# Patient Record
Sex: Female | Born: 1935
Health system: Southern US, Community
[De-identification: ages and names within clinical notes are randomized; demographics above are authoritative.]

## PROBLEM LIST (undated history)

## (undated) DIAGNOSIS — Z96659 Presence of unspecified artificial knee joint: Secondary | ICD-10-CM

## (undated) DIAGNOSIS — R42 Dizziness and giddiness: Secondary | ICD-10-CM

## (undated) DIAGNOSIS — G43909 Migraine, unspecified, not intractable, without status migrainosus: Secondary | ICD-10-CM

## (undated) DIAGNOSIS — F329 Major depressive disorder, single episode, unspecified: Secondary | ICD-10-CM

## (undated) DIAGNOSIS — M810 Age-related osteoporosis without current pathological fracture: Secondary | ICD-10-CM

## (undated) DIAGNOSIS — E785 Hyperlipidemia, unspecified: Secondary | ICD-10-CM

## (undated) DIAGNOSIS — G939 Disorder of brain, unspecified: Secondary | ICD-10-CM

## (undated) DIAGNOSIS — E7800 Pure hypercholesterolemia, unspecified: Secondary | ICD-10-CM

## (undated) DIAGNOSIS — G243 Spasmodic torticollis: Secondary | ICD-10-CM

## (undated) HISTORY — DX: Age-related osteoporosis without current pathological fracture: M81.0

## (undated) HISTORY — PX: ANKLE SURGERY: SHX546

## (undated) HISTORY — DX: Disorder of brain, unspecified: G93.9

## (undated) HISTORY — DX: Dizziness and giddiness: R42

## (undated) HISTORY — DX: Migraine, unspecified, not intractable, without status migrainosus: G43.909

## (undated) HISTORY — DX: Major depressive disorder, single episode, unspecified: F32.9

## (undated) HISTORY — DX: Presence of unspecified artificial knee joint: Z96.659

## (undated) HISTORY — PX: KNEE SURGERY: SHX244

## (undated) HISTORY — DX: Hyperlipidemia, unspecified: E78.5

## (undated) HISTORY — DX: Spasmodic torticollis: G24.3

## (undated) HISTORY — PX: ABDOMINAL HYSTERECTOMY: SHX81

---

## 2004-09-14 ENCOUNTER — Ambulatory Visit: Payer: Self-pay | Admitting: Internal Medicine

## 2004-09-18 ENCOUNTER — Ambulatory Visit: Payer: Self-pay | Admitting: Internal Medicine

## 2005-10-27 ENCOUNTER — Ambulatory Visit: Payer: Self-pay | Admitting: Internal Medicine

## 2006-09-19 ENCOUNTER — Other Ambulatory Visit: Payer: Self-pay

## 2006-09-19 ENCOUNTER — Ambulatory Visit: Payer: Self-pay | Admitting: General Practice

## 2006-10-03 ENCOUNTER — Inpatient Hospital Stay: Payer: Self-pay | Admitting: General Practice

## 2007-03-31 ENCOUNTER — Ambulatory Visit: Payer: Self-pay | Admitting: Internal Medicine

## 2008-07-02 ENCOUNTER — Ambulatory Visit: Payer: Self-pay | Admitting: Internal Medicine

## 2009-07-03 ENCOUNTER — Ambulatory Visit: Payer: Self-pay | Admitting: Internal Medicine

## 2010-03-24 ENCOUNTER — Ambulatory Visit: Payer: Self-pay | Admitting: Gastroenterology

## 2010-07-06 ENCOUNTER — Ambulatory Visit: Payer: Self-pay | Admitting: Unknown Physician Specialty

## 2011-09-08 ENCOUNTER — Ambulatory Visit: Payer: Self-pay | Admitting: Internal Medicine

## 2011-11-21 ENCOUNTER — Inpatient Hospital Stay: Payer: Self-pay | Admitting: Internal Medicine

## 2011-11-21 LAB — COMPREHENSIVE METABOLIC PANEL
Albumin: 3.5 g/dL (ref 3.4–5.0)
Anion Gap: 9 (ref 7–16)
Chloride: 109 mmol/L — ABNORMAL HIGH (ref 98–107)
Creatinine: 0.8 mg/dL (ref 0.60–1.30)
EGFR (African American): >60
EGFR (Non-African Amer.): >60
Glucose: 99 mg/dL (ref 65–99)
Osmolality: 283 (ref 275–301)
Potassium: 4.8 mmol/L (ref 3.5–5.1)
SGPT (ALT): 26 U/L
Sodium: 141 mmol/L (ref 136–145)
Total Protein: 7.6 g/dL (ref 6.4–8.2)

## 2011-11-21 LAB — CBC
HGB: 11.8 g/dL — ABNORMAL LOW (ref 12.0–16.0)
MCH: 34.6 pg — ABNORMAL HIGH (ref 26.0–34.0)
MCHC: 33.3 g/dL (ref 32.0–36.0)
RBC: 3.4 10*6/uL — ABNORMAL LOW (ref 3.80–5.20)
WBC: 11.7 10*3/uL — ABNORMAL HIGH (ref 3.6–11.0)

## 2011-11-21 LAB — URINALYSIS, COMPLETE
Bacteria: NONE SEEN
Bilirubin,UR: NEGATIVE
Glucose,UR: NEGATIVE mg/dL (ref 0–75)
Leukocyte Esterase: NEGATIVE
Protein: NEGATIVE
Squamous Epithelial: 1
WBC UR: 1 /HPF (ref 0–5)

## 2011-11-22 LAB — BASIC METABOLIC PANEL
Anion Gap: 8 (ref 7–16)
Calcium, Total: 8.5 mg/dL (ref 8.5–10.1)
Co2: 27 mmol/L (ref 21–32)
EGFR (African American): >60
Glucose: 89 mg/dL (ref 65–99)
Potassium: 4.6 mmol/L (ref 3.5–5.1)
Sodium: 143 mmol/L (ref 136–145)

## 2011-11-22 LAB — CBC WITH DIFFERENTIAL/PLATELET
Basophil #: 0 10*3/uL (ref 0.0–0.1)
Basophil %: 0.7 %
Eosinophil #: 0.1 10*3/uL (ref 0.0–0.7)
Eosinophil %: 2 %
HCT: 33.9 % — ABNORMAL LOW (ref 35.0–47.0)
HGB: 11.3 g/dL — ABNORMAL LOW (ref 12.0–16.0)
Lymphocyte #: 1.4 10*3/uL (ref 1.0–3.6)
Lymphocyte %: 21.4 %
MCH: 34.8 pg — ABNORMAL HIGH (ref 26.0–34.0)
MCHC: 33.4 g/dL (ref 32.0–36.0)
MCV: 104 fL — ABNORMAL HIGH (ref 80–100)
Monocyte #: 0.4 x10 3/mm (ref 0.2–0.9)
Monocyte %: 6.3 %
Neutrophil #: 4.4 10*3/uL (ref 1.4–6.5)
Neutrophil %: 69.6 %
Platelet: 220 10*3/uL (ref 150–440)
RBC: 3.26 10*6/uL — ABNORMAL LOW (ref 3.80–5.20)
RDW: 13.1 % (ref 11.5–14.5)
WBC: 6.3 10*3/uL (ref 3.6–11.0)

## 2012-01-12 ENCOUNTER — Ambulatory Visit: Payer: Self-pay | Admitting: Orthopedic Surgery

## 2012-01-24 DIAGNOSIS — G243 Spasmodic torticollis: Secondary | ICD-10-CM

## 2012-01-24 HISTORY — DX: Spasmodic torticollis: G24.3

## 2012-09-25 ENCOUNTER — Ambulatory Visit: Payer: Self-pay | Admitting: Internal Medicine

## 2014-02-25 ENCOUNTER — Emergency Department: Payer: Self-pay | Admitting: Emergency Medicine

## 2014-02-25 LAB — URINALYSIS, COMPLETE
BACTERIA: NONE SEEN
BLOOD: NEGATIVE
Bilirubin,UR: NEGATIVE
Glucose,UR: NEGATIVE mg/dL (ref 0–75)
Ketone: NEGATIVE
Leukocyte Esterase: NEGATIVE
Nitrite: NEGATIVE
PROTEIN: NEGATIVE
Ph: 6 (ref 4.5–8.0)
RBC,UR: 1 /HPF (ref 0–5)
Specific Gravity: 1.012 (ref 1.003–1.030)
Squamous Epithelial: <1
WBC UR: NONE SEEN /HPF (ref 0–5)

## 2014-02-25 LAB — COMPREHENSIVE METABOLIC PANEL
ALBUMIN: 3.5 g/dL (ref 3.4–5.0)
ALK PHOS: 84 U/L
AST: 31 U/L (ref 15–37)
Anion Gap: 4 — ABNORMAL LOW (ref 7–16)
BILIRUBIN TOTAL: 0.3 mg/dL (ref 0.2–1.0)
BUN: 17 mg/dL (ref 7–18)
CHLORIDE: 106 mmol/L (ref 98–107)
CREATININE: 0.89 mg/dL (ref 0.60–1.30)
Calcium, Total: 9.1 mg/dL (ref 8.5–10.1)
Co2: 29 mmol/L (ref 21–32)
EGFR (Non-African Amer.): >60
GLUCOSE: 86 mg/dL (ref 65–99)
Osmolality: 278 (ref 275–301)
Potassium: 3.8 mmol/L (ref 3.5–5.1)
SGPT (ALT): 22 U/L
SODIUM: 139 mmol/L (ref 136–145)
TOTAL PROTEIN: 7.6 g/dL (ref 6.4–8.2)

## 2014-02-25 LAB — CBC
HCT: 36.9 % (ref 35.0–47.0)
HGB: 12.1 g/dL (ref 12.0–16.0)
MCH: 33.9 pg (ref 26.0–34.0)
MCHC: 32.7 g/dL (ref 32.0–36.0)
MCV: 104 fL — ABNORMAL HIGH (ref 80–100)
PLATELETS: 344 10*3/uL (ref 150–440)
RBC: 3.56 10*6/uL — ABNORMAL LOW (ref 3.80–5.20)
RDW: 13.3 % (ref 11.5–14.5)
WBC: 6.2 10*3/uL (ref 3.6–11.0)

## 2014-02-25 LAB — TROPONIN I: Troponin-I: <0.02 ng/mL

## 2014-03-11 ENCOUNTER — Encounter: Payer: Self-pay | Admitting: Neurology

## 2014-03-13 ENCOUNTER — Ambulatory Visit: Payer: Self-pay | Admitting: Neurology

## 2014-03-21 ENCOUNTER — Encounter: Payer: Self-pay | Admitting: Neurology

## 2014-03-27 DIAGNOSIS — G939 Disorder of brain, unspecified: Secondary | ICD-10-CM

## 2014-03-27 HISTORY — DX: Disorder of brain, unspecified: G93.9

## 2014-04-21 ENCOUNTER — Encounter: Payer: Self-pay | Admitting: Neurology

## 2014-05-21 ENCOUNTER — Encounter: Payer: Self-pay | Admitting: Neurology

## 2014-06-21 ENCOUNTER — Encounter: Payer: Self-pay | Admitting: Neurology

## 2014-10-13 NOTE — Discharge Summary (Signed)
PATIENT NAME:  [name], Gabriella Rogers DATE OF BIRTH:  Jun 05, 1934  DATE OF ADMISSION:  11/21/2011 DATE OF DISCHARGE:  11/23/2011  DISCHARGE DIAGNOSES:  1. Near syncope. 2. Right inferior pubic rami fracture.  3. Osteoporosis. 4. Osteoarthritis.  5. Normocytic anemia. 6. Mild leukocytosis. 7. Migraine headaches.  8. Gastroesophageal reflux disease.  9. History of cervical dystonia status post Botox injection.   DISPOSITION: Patient is being discharged home with home health and physical therapy.   DIET: Regular.   ACTIVITY: As tolerated.   FOLLOW UP: Follow up with Dr. Martha ClanKrasinski and Dr. Randa LynnLamb in 1 to 2 weeks after discharge.   DISCHARGE MEDICATIONS: 1. Norco 5/325, 1 to 2 tablets every four hours p.r.n. pain.  2. Dulcolax 1 tablet as needed for constipation. 3. Caltrate 600 mg with vitamin D 1 tablet b.i.d.  4. Klonopin 0.5 mg 1 tablet in the morning and 1/2 tablet, 0.25 mg, at bedtime.  5. Paxil 10 mg daily. Daily.  6. Evista 1 mg daily.  7. Aspirin 81 mg daily.   CONSULTATIONS:  1. Physical therapy consultation.  2. Orthopedic consultation with Dr. Martha ClanKrasinski.   LABORATORY, DIAGNOSTIC AND RADIOLOGICAL DATA: Pelvic x-ray: Right inferior pubic ramus fracture. Carotid ultrasound showed no acute abnormalities. Echo showed no acute abnormalities, normal ejection fraction more than 55%, RVSP 30 to 40 mmHg. White count 11.7 on admission, 6.3 by the time of discharge. Hemoglobin 11.8. Normal platelet count. CBC, CMP essentially normal.   HOSPITAL COURSE: Patient is a 81 year old female with past medical history of osteoporosis, osteoarthritis, migraine headaches, gastroesophageal reflux disease who presented with fall. There was a question of possible syncope/near syncope, however, patient denied loss of consciousness. She reported that her foot had got caught and she had tripped. She was subsequently unable to get up and was having difficulty walking and weight bearing therefore  she was brought by her family to the Emergency Room where an x-ray revealed right inferior pubic rami fracture. She also was worked up for near syncope with an echocardiogram and carotid ultrasound, both of which were normal. An orthopedic consultation with Dr. Martha ClanKrasinski was obtained who recommended conservative management. Patient was evaluated by PT. She ambulated well with a walker and had a walker at home. Home health and physical therapy were offered to the patient and she accepted. She is being discharged home in a stable condition.  TIME SPENT: 45 minutes.   ____________________________ Darrick MeigsSangeeta Tamar Miano, MD sp:cms D: 11/23/2011 14:51:25 ET T: 11/24/2011 10:30:41 ET JOB#: [id number]  cc: Darrick MeigsSangeeta Saraiya Kozma, MD, <Dictator> Reola MosherAndrew S. Randa LynnLamb, MD Darrick MeigsSANGEETA Taeveon Keesling MD ELECTRONICALLY SIGNED 11/24/2011 14:58

## 2014-10-13 NOTE — Consult Note (Signed)
Brief Consult Note: Diagnosis: Right inferior rami fracture.   Patient was seen by consultant.   Recommend further assessment or treatment.   Comments: Patient is 81 y/o female who fell unloading bags of from her car.  She lost her balance going into the house and landed on her right buttocks.  She had significant pain and was brought to [facility] ER where x-rays were suggestive of an inferior rami fracture.  On exam, she has ecchymosis overlying the ischial tuberosity.  She has no pain with internal or external hip rotation with log-rolling.  She has intact sensation to light touch, intact motor function and palpable pedal pulses in the right lower extremity.  She has no pain on the left.  Radiograph of the pelvis was reviewed from the ED which shows a lucency through the inferior rami, near the ischial tuberosity.  No obvious fracture is seen in the superior rami.  I would recommend PT evaluation in the AM and the patient may WBAT on the right lower extremity with a walker.  She may follow-up in my office in 4 weeks after discharge for re-evaluation.  Will sign off for now.  Electronic Signatures: Juanell FairlyKrasinski, Brentyn Seehafer (MD)  (Signed 03-Jun-13 19:32)  Authored: Brief Consult Note   Last Updated: 03-Jun-13 19:32 by Juanell FairlyKrasinski, Asley Baskerville (MD)

## 2014-10-13 NOTE — H&P (Signed)
PATIENT NAME:  Gabriella Rogers, Gabriella Rogers MR#:  [id number] DATE OF BIRTH:  1934/05/29  DATE OF ADMISSION:  11/21/2011  REFERRING PHYSICIAN: Janalyn Harderavid Kaminski, MD  FAMILY PHYSICIAN: Alonna BucklerAndrew Lamb, MD  REASON FOR ADMISSION: Near syncope with fall resulting in right inferior ramus fracture.   HISTORY OF PRESENT ILLNESS: The patient is a 81 year old female with a history of osteoporosis and osteoarthritis who was walking outside today where she apparently stumbled and fell falling on her back. She does not feel that she passed out all the way, although she did feel lightheaded and dizzy. She was brought to the Emergency Room where she was found to have a right inferior ramus fracture. Cardiac murmur was noted on exam. She denies any history of cardiac disease. She is now admitted for further evaluation.   PAST MEDICAL HISTORY:  1. Osteoporosis.  2. Osteoarthritis.  3. Migraine headaches. 4. Environmental allergies. 5. Gastroesophageal reflux disease.  6. History of cervical dystonia requiring Botox injections.  7. Status post hysterectomy.  8. Status post left knee surgery.  9. Status post left ankle fusion.   MEDICATIONS:  1. Evista 60 mg p.o. daily.  2. Paxil 10 mg p.o. daily.  3. Clonazepam 0.5 mg p.o. every a.m. and 0.25 mg p.o. every p.m.  4. Caltrate D one p.o. twice a day. 5. Aspirin 81 mg p.o. daily.   ALLERGIES: Aleve and Actonel.   SOCIAL HISTORY: The patient is married. No history of alcohol or tobacco abuse.   FAMILY HISTORY: Positive for coronary artery disease and hypertension.   REVIEW OF SYSTEMS: CONSTITUTIONAL: No fever or change in weight. EYES: No blurred or double vision. No glaucoma. ENT: No tinnitus or hearing loss. No nasal discharge or bleeding. No difficulty swallowing. RESPIRATORY: No cough or wheezing. Denies hemoptysis or painful respirations. CARDIOVASCULAR: No chest pain or orthopnea. Denies palpitations. Denies actual syncope. GI: No nausea, vomiting, or diarrhea. No  abdominal pain. No change in bowel habits. GU: No dysuria or hematuria. No incontinence. ENDOCRINE: No polyuria or polydipsia. No heat or cold intolerance. HEMATOLOGIC: The patient denies anemia, easy bruising, or bleeding. LYMPHATIC: No swollen glands. MUSCULOSKELETAL: The patient does have some pain in her back and hips. Denies neck or shoulder pain. No knee pain. Has chronic left ankle pain. No gout. NEUROLOGIC: No numbness or weakness. Denies stroke or seizures. No recent migraines. PSYCH: The patient denies anxiety, insomnia, or depression.   PHYSICAL EXAMINATION:   GENERAL: The patient is in no acute distress.   VITAL SIGNS: Vital signs are remarkable for a blood pressure of 155/69 with a heart rate of 60 and a respiratory of 20. She is afebrile.   HEENT: Normocephalic, atraumatic. Pupils are equally round and reactive to light and accommodation. Extraocular movements are intact. Sclerae anicteric. Conjunctivae are clear. Oropharynx is clear.   NECK: Supple without jugular venous distention or bruits. No adenopathy or thyromegaly is noted.   LUNGS: Clear to auscultation and percussion without wheezes, rales, or rhonchi. No dullness.   CARDIAC: Regular rate and rhythm with normal S1 and S2. There is a 2/6 systolic murmur noted throughout the precordium. No rubs or gallops were present.   ABDOMEN: Soft and nontender with normoactive bowel sounds. No organomegaly or masses were appreciated. No hernias or bruits were noted.   EXTREMITIES: No clubbing, cyanosis, or edema. Pulses were 2+ bilaterally.   SKIN: Warm and dry without rash or lesions.   NEUROLOGIC: Cranial nerves II through XII grossly intact. Deep tendon reflexes were symmetric. Motor and  sensory exam is nonfocal.   PSYCH: Examination revealed a patient who was alert and oriented to person, place, and time. She was cooperative and used good judgment.   LABS/STUDIES: Urinalysis was negative.   White count was 11.7 with a  hemoglobin of 11.8. Glucose is 99 with a BUN of 17 and a creatinine of 0.8 with a sodium of 141 and a potassium of 4.8.   EKG revealed sinus rhythm with no acute ischemic changes.   Plain films revealed a nondisplaced fracture of the right inferior ramus.   ASSESSMENT:  1. Near syncope.  2. Cardiac murmur.  3. Anemia of chronic disease.  4. Right inferior ramus fracture.  5. Osteoporosis.  6. Osteoarthritis.   PLAN: The patient will be admitted to orthopedics with off unit telemetry. We will obtain an echocardiogram because of her murmur and we will obtain carotid Doppler's because of her near syncopal episode. In regards to her inferior ramus fracture, we will consult orthopedics and begin physical therapy. We will use narcotics as needed for pain management. We will obtain a case          manager consult for possible rehab placement. Follow-up routine labs in the morning. Further treatment and evaluation will depend upon the patient's progress.   TOTAL TIME SPENT:  50 minutes.  ____________________________ Duane Lope Judithann Sheen, MD jds:slb D: 11/21/2011 19:26:21 ET T: 11/22/2011 07:39:49 ET JOB#: [id number]  cc: Duane Lope. Judithann Sheen, MD, <Dictator> Reola Mosher. Randa Lynn, MD Toby Breithaupt Rodena Medin MD ELECTRONICALLY SIGNED 11/22/2011 14:56

## 2015-03-05 DIAGNOSIS — M12569 Traumatic arthropathy, unspecified knee: Secondary | ICD-10-CM | POA: Insufficient documentation

## 2015-06-22 ENCOUNTER — Emergency Department
Admission: EM | Admit: 2015-06-22 | Discharge: 2015-06-22 | Disposition: A | Discharge status: Home / Self Care | Payer: Medicare HMO | Attending: Emergency Medicine | Admitting: Emergency Medicine

## 2015-06-22 ENCOUNTER — Emergency Department: Payer: Medicare HMO

## 2015-06-22 ENCOUNTER — Encounter: Payer: Self-pay | Admitting: Emergency Medicine

## 2015-06-22 DIAGNOSIS — S52592A Other fractures of lower end of left radius, initial encounter for closed fracture: Secondary | ICD-10-CM | POA: Insufficient documentation

## 2015-06-22 DIAGNOSIS — S52502A Unspecified fracture of the lower end of left radius, initial encounter for closed fracture: Secondary | ICD-10-CM | POA: Diagnosis not present

## 2015-06-22 DIAGNOSIS — Y9289 Other specified places as the place of occurrence of the external cause: Secondary | ICD-10-CM | POA: Insufficient documentation

## 2015-06-22 DIAGNOSIS — W1839XA Other fall on same level, initial encounter: Secondary | ICD-10-CM | POA: Insufficient documentation

## 2015-06-22 DIAGNOSIS — S5012XA Contusion of left forearm, initial encounter: Secondary | ICD-10-CM | POA: Diagnosis not present

## 2015-06-22 DIAGNOSIS — Y9389 Activity, other specified: Secondary | ICD-10-CM | POA: Insufficient documentation

## 2015-06-22 DIAGNOSIS — Y998 Other external cause status: Secondary | ICD-10-CM | POA: Insufficient documentation

## 2015-06-22 DIAGNOSIS — S6992XA Unspecified injury of left wrist, hand and finger(s), initial encounter: Secondary | ICD-10-CM | POA: Diagnosis present

## 2015-06-22 DIAGNOSIS — Z79899 Other long term (current) drug therapy: Secondary | ICD-10-CM | POA: Diagnosis not present

## 2015-06-22 HISTORY — DX: Pure hypercholesterolemia, unspecified: E78.00

## 2015-06-22 MED ORDER — TRAMADOL HCL 50 MG PO TABS: 50.0000 mg | ORAL_TABLET | Freq: Four times a day (QID) | ORAL | Status: DC | PRN

## 2015-06-22 MED ORDER — TRAMADOL HCL 50 MG PO TABS
50.0000 mg | ORAL_TABLET | Freq: Once | ORAL | Status: AC
Start: 2015-06-22 — End: 2015-06-22
  Administered 2015-06-22: 50 mg via ORAL
  Filled 2015-06-22: qty 1

## 2015-06-22 NOTE — ED Notes (Signed)
States she fell on Friday  Having pain to left wrist area

## 2015-06-22 NOTE — ED Provider Notes (Signed)
[facility] Emergency Department Provider Note ____________________________________________  Time seen: Approximately 12:09 PM  I have reviewed the triage vital signs and the nursing notes.   HISTORY  Chief Complaint Fall    HPI Gabriella Rogers is a 82 y.o. female who presents to the emergency department for evaluation of left wrist pain. She had a mechanical, non-syncopal fall on Friday while trying to put a fitted sheet on her bed. She states that she pulled a sheet and lost her balance which caused her to fall backward. She denies loss of consciousness. The incident occurred on Friday. She has been using an Ace wrap for compression and taking ibuprofen with minimal relief of pain.   Past Medical History  Diagnosis Date  . Hypercholesteremia     There are no active problems to display for this patient.   Past Surgical History  Procedure Laterality Date  . Abdominal hysterectomy      Current Outpatient Rx  Name  Route  Sig  Dispense  Refill  . simvastatin (ZOCOR) 20 MG tablet   Oral   Take 20 mg by mouth daily.         . traMADol (ULTRAM) 50 MG tablet   Oral   Take 1 tablet (50 mg total) by mouth every 6 (six) hours as needed.   20 tablet   0     Allergies Aleve and Fosamax  No family history on file.  Social History Social History  Substance Use Topics  . Smoking status: Never Smoker   . Smokeless tobacco: None  . Alcohol Use: No    Review of Systems Constitutional: No recent illness. Eyes: No visual changes. ENT: No sore throat. Cardiovascular: Denies chest pain or palpitations. Respiratory: Denies shortness of breath. Gastrointestinal: No abdominal pain.  Genitourinary: Negative for dysuria. Musculoskeletal: Pain in left hand and wrist Skin: Negative for rash. Neurological: Negative for headaches, focal weakness or numbness. 10-point ROS otherwise negative.  ____________________________________________   PHYSICAL  EXAM:  VITAL SIGNS: ED Triage Vitals  Enc Vitals Group     BP 06/22/15 1142 147/78 mmHg     Pulse Rate 06/22/15 1142 68     Resp 06/22/15 1142 18     Temp 06/22/15 1142 97.7 F (36.5 C)     Temp Source 06/22/15 1142 Oral     SpO2 06/22/15 1142 98 %     Weight 06/22/15 1142 116 lb (52.617 kg)     Height 06/22/15 1142 5\' 3"  (1.6 m)     Head Cir --      Peak Flow --      Pain Score 06/22/15 1130 8     Pain Loc --      Pain Edu? --      Excl. in GC? --     Constitutional: Alert and oriented. Well appearing and in no acute distress. Eyes: Conjunctivae are normal. EOMI. Head: Atraumatic. Nose: No congestion/rhinnorhea. Neck: No stridor.  Respiratory: Normal respiratory effort.   Musculoskeletal: Tenderness and mild deformity noted to the radial aspect of the left wrist. There is mild snuffbox tenderness noted in the left hand. She has full range of motion and 2 point discrimination in the hand and fingers. Neurologic:  Normal speech and language. No gross focal neurologic deficits are appreciated. Speech is normal. No gait instability. Skin:  Skin is warm, dry and intact. Ecchymosis noted from the mid forearm to the hand with swelling. Psychiatric: Mood and affect are normal. Speech and behavior are  normal.  ____________________________________________   LABS (all labs ordered are listed, but only abnormal results are displayed)  Labs Reviewed - No data to display ____________________________________________  RADIOLOGY  Distal radius fracture left.  I, Kem Boroughs, personally viewed and evaluated these images (plain radiographs) as part of my medical decision making, as well as reviewing the written report by the radiologist.  ____________________________________________   PROCEDURES  Procedure(s) performed:   Reverse sugar tong OCL was placed by ER tech. Patient was neurovascularly intact post-splint  application. ____________________________________________   INITIAL IMPRESSION / ASSESSMENT AND PLAN / ED COURSE  Pertinent labs & imaging results that were available during my care of the patient were reviewed by me and considered in my medical decision making (see chart for details).  Initial fracture care provided in the emergency department with follow-up greater than 24 hours.  Patient was advised to call and schedule an appointment with orthopedics. She was advised to return to the emergency department for any symptom that changes or worsen if she is unable to schedule an appointment. ____________________________________________   FINAL CLINICAL IMPRESSION(S) / ED DIAGNOSES  Final diagnoses:  Distal radius fracture, left, closed, initial encounter       Chinita Pester, FNP 06/22/15 1230  Jene Every, MD 06/22/15 1530

## 2015-06-22 NOTE — Discharge Instructions (Signed)
Forearm Fracture °A forearm fracture is a break in one or both of the bones of your arm that are between the elbow and the wrist. Your forearm is made up of two bones: °· Radius. This is the bone on the inside of your arm near your thumb. °· Ulna. This is the bone on the outside of your arm near your little finger. °Middle forearm fractures usually break both the radius and the ulna. Most forearm fractures that involve both the ulna and radius will require surgery. °CAUSES °Common causes of this type of fracture include: °· Falling on an outstretched arm. °· Accidents, such as a car or bike accident. °· A hard, direct hit to the middle part of your arm. °RISK FACTORS °You may be at higher risk for this type of fracture if: °· You play contact sports. °· You have a condition that causes your bones to be weak or thin (osteoporosis). °SIGNS AND SYMPTOMS °A forearm fracture causes pain immediately after the injury. Other signs and symptoms include: °· An abnormal bend or bump in your arm (deformity). °· Swelling. °· Numbness or tingling. °· Tenderness. °· Inability to turn your hand from side to side (rotate). °· Bruising. °DIAGNOSIS °Your health care provider may diagnose a forearm fracture based on: °· Your symptoms. °· Your medical history, including any recent injury. °· A physical exam. Your health care provider will look for any deformity and feel for tenderness over the break. Your health care provider will also check whether the bones are out of place. °· An X-ray exam to confirm the diagnosis and learn more about the type of fracture. °TREATMENT °The goals of treatment are to get the bone or bones in proper position for healing and to keep the bones from moving so they will heal over time. Your treatment will depend on many factors, especially the type of fracture that you have. °· If the fractured bone or bones: °¨ Are in the correct position (nondisplaced), you may only need to wear a cast or a  splint. °¨ Have a slightly displaced fracture, you may need to have the bones moved back into place manually (closed reduction) before the splint or cast is put on. °· You may have a temporary splint before you have a cast. The splint allows room for some swelling. After a few days, a cast can replace the splint. °· You may have to wear the cast for 6-8 weeks or as directed by your health care provider. °· The cast may be changed after about 3 weeks or as directed by your health care provider. °· After your cast is removed, you may need physical therapy to regain full movement in your wrist or elbow. °· You may need emergency surgery if you have: °¨ A fractured bone or bones that are out of position (displaced). °¨ A fracture with multiple fragments (comminuted fracture). °¨ A fracture that breaks the skin (open fracture). This type of fracture may require surgical wires, plates, or screws to hold the bone or bones in place. °· You may have X-rays every couple of weeks to check on your healing. °HOME CARE INSTRUCTIONS °If You Have a Cast: °· Do not stick anything inside the cast to scratch your skin. Doing that increases your risk of infection. °· Check the skin around the cast every day. Report any concerns to your health care provider. You may put lotion on dry skin around the edges of the cast. Do not apply lotion to the skin   underneath the cast. °If You Have a Splint: °· Wear it as directed by your health care provider. Remove it only as directed by your health care provider. °· Loosen the splint if your fingers become numb and tingle, or if they turn cold and blue. °Bathing °· Cover the cast or splint with a watertight plastic bag to protect it from water while you bathe or shower. Do not let the cast or splint get wet. °Managing Pain, Stiffness, and Swelling °· If directed, apply ice to the injured area: °¨ Put ice in a plastic bag. °¨ Place a towel between your skin and the bag. °¨ Leave the ice on for 20  minutes, 2-3 times a day. °· Move your fingers often to avoid stiffness and to lessen swelling. °· Raise the injured area above the level of your heart while you are sitting or lying down. °Driving °· Do not drive or operate heavy machinery while taking pain medicine. °· Do not drive while wearing a cast or splint on a hand that you use for driving. °Activity °· Return to your normal activities as directed by your health care provider. Ask your health care provider what activities are safe for you. °· Perform range-of-motion exercises only as directed by your health care provider. °Safety °· Do not use your injured limb to support your body weight until your health care provider says that you can. °General Instructions °· Do not put pressure on any part of the cast or splint until it is fully hardened. This may take several hours. °· Keep the cast or splint clean and dry. °· Do not use any tobacco products, including cigarettes, chewing tobacco, or electronic cigarettes. Tobacco can delay bone healing. If you need help quitting, ask your health care provider. °· Take medicines only as directed by your health care provider. °· Keep all follow-up visits as directed by your health care provider. This is important. °SEEK MEDICAL CARE IF: °· Your pain medicine is not helping. °· Your cast or splint becomes wet or damaged or suddenly feels too tight. °· Your cast becomes loose. °· You have more severe pain or swelling than you did before the cast. °· You have severe pain when you stretch your fingers. °· You continue to have pain or stiffness in your elbow or your wrist after your cast is removed. °SEEK IMMEDIATE MEDICAL CARE IF: °· You cannot move your fingers. °· You lose feeling in your fingers or your hand. °· Your hand or your fingers turn cold and pale or blue. °· You notice a bad smell coming from your cast. °· You have drainage from underneath your cast. °· You have new stains from blood or drainage that is coming  through your cast. °  °This information is not intended to replace advice given to you by your health care provider. Make sure you discuss any questions you have with your health care provider. °  °Document Released: 06/04/2000 Document Revised: 06/28/2014 Document Reviewed: 01/21/2014 °[employer]vier Interactive Patient Education ©2016 [employer] ° °

## 2015-06-22 NOTE — ED Notes (Signed)
States she fell on Friday  Landed on left arm  Having pain to left wrist area states she loses her balance nalot

## 2015-07-02 DIAGNOSIS — M25532 Pain in left wrist: Secondary | ICD-10-CM | POA: Diagnosis not present

## 2015-07-02 DIAGNOSIS — S52502A Unspecified fracture of the lower end of left radius, initial encounter for closed fracture: Secondary | ICD-10-CM | POA: Diagnosis not present

## 2015-07-11 DIAGNOSIS — M25532 Pain in left wrist: Secondary | ICD-10-CM | POA: Diagnosis not present

## 2015-07-11 DIAGNOSIS — S52502D Unspecified fracture of the lower end of left radius, subsequent encounter for closed fracture with routine healing: Secondary | ICD-10-CM | POA: Diagnosis not present

## 2015-07-22 DIAGNOSIS — R5382 Chronic fatigue, unspecified: Secondary | ICD-10-CM | POA: Diagnosis not present

## 2015-07-22 DIAGNOSIS — M797 Fibromyalgia: Secondary | ICD-10-CM | POA: Diagnosis not present

## 2015-07-22 DIAGNOSIS — R5383 Other fatigue: Secondary | ICD-10-CM | POA: Diagnosis not present

## 2015-07-22 DIAGNOSIS — E785 Hyperlipidemia, unspecified: Secondary | ICD-10-CM | POA: Diagnosis not present

## 2015-07-23 DIAGNOSIS — I10 Essential (primary) hypertension: Secondary | ICD-10-CM | POA: Diagnosis not present

## 2015-07-23 DIAGNOSIS — R5381 Other malaise: Secondary | ICD-10-CM | POA: Diagnosis not present

## 2015-07-23 DIAGNOSIS — E784 Other hyperlipidemia: Secondary | ICD-10-CM | POA: Diagnosis not present

## 2015-07-25 DIAGNOSIS — M25532 Pain in left wrist: Secondary | ICD-10-CM | POA: Diagnosis not present

## 2015-07-25 DIAGNOSIS — S52502D Unspecified fracture of the lower end of left radius, subsequent encounter for closed fracture with routine healing: Secondary | ICD-10-CM | POA: Diagnosis not present

## 2015-08-08 DIAGNOSIS — H2513 Age-related nuclear cataract, bilateral: Secondary | ICD-10-CM | POA: Diagnosis not present

## 2015-08-15 DIAGNOSIS — S52502D Unspecified fracture of the lower end of left radius, subsequent encounter for closed fracture with routine healing: Secondary | ICD-10-CM | POA: Diagnosis not present

## 2015-08-15 DIAGNOSIS — M25532 Pain in left wrist: Secondary | ICD-10-CM | POA: Diagnosis not present

## 2015-08-26 DIAGNOSIS — M542 Cervicalgia: Secondary | ICD-10-CM | POA: Diagnosis not present

## 2015-08-26 DIAGNOSIS — G309 Alzheimer's disease, unspecified: Secondary | ICD-10-CM | POA: Diagnosis not present

## 2015-08-26 DIAGNOSIS — Z79899 Other long term (current) drug therapy: Secondary | ICD-10-CM | POA: Diagnosis not present

## 2015-08-26 DIAGNOSIS — G243 Spasmodic torticollis: Secondary | ICD-10-CM | POA: Diagnosis not present

## 2015-08-26 DIAGNOSIS — R69 Illness, unspecified: Secondary | ICD-10-CM | POA: Diagnosis not present

## 2015-08-26 DIAGNOSIS — Z7982 Long term (current) use of aspirin: Secondary | ICD-10-CM | POA: Diagnosis not present

## 2015-09-15 ENCOUNTER — Other Ambulatory Visit: Payer: Self-pay | Admitting: Internal Medicine

## 2015-09-15 DIAGNOSIS — M810 Age-related osteoporosis without current pathological fracture: Secondary | ICD-10-CM

## 2015-09-18 DIAGNOSIS — G243 Spasmodic torticollis: Secondary | ICD-10-CM | POA: Diagnosis not present

## 2015-09-18 DIAGNOSIS — E785 Hyperlipidemia, unspecified: Secondary | ICD-10-CM | POA: Diagnosis not present

## 2015-09-25 ENCOUNTER — Ambulatory Visit
Admission: RE | Admit: 2015-09-25 | Discharge: 2015-09-25 | Disposition: A | Discharge status: Home / Self Care | Payer: Medicare HMO | Source: Ambulatory Visit | Attending: Internal Medicine | Admitting: Internal Medicine

## 2015-09-25 DIAGNOSIS — M8580 Other specified disorders of bone density and structure, unspecified site: Secondary | ICD-10-CM | POA: Diagnosis not present

## 2015-09-25 DIAGNOSIS — M810 Age-related osteoporosis without current pathological fracture: Secondary | ICD-10-CM

## 2015-09-25 DIAGNOSIS — Z13820 Encounter for screening for osteoporosis: Secondary | ICD-10-CM | POA: Diagnosis not present

## 2015-09-25 DIAGNOSIS — Z780 Asymptomatic menopausal state: Secondary | ICD-10-CM | POA: Diagnosis not present

## 2015-10-22 DIAGNOSIS — G5602 Carpal tunnel syndrome, left upper limb: Secondary | ICD-10-CM | POA: Diagnosis not present

## 2015-11-12 DIAGNOSIS — L821 Other seborrheic keratosis: Secondary | ICD-10-CM | POA: Diagnosis not present

## 2015-11-20 DIAGNOSIS — Z96652 Presence of left artificial knee joint: Secondary | ICD-10-CM | POA: Diagnosis not present

## 2015-11-20 DIAGNOSIS — M25561 Pain in right knee: Secondary | ICD-10-CM | POA: Diagnosis not present

## 2015-11-20 DIAGNOSIS — M25562 Pain in left knee: Secondary | ICD-10-CM | POA: Diagnosis not present

## 2015-11-20 DIAGNOSIS — M1731 Unilateral post-traumatic osteoarthritis, right knee: Secondary | ICD-10-CM | POA: Diagnosis not present

## 2015-11-23 DIAGNOSIS — Z96659 Presence of unspecified artificial knee joint: Secondary | ICD-10-CM

## 2015-11-23 HISTORY — DX: Presence of unspecified artificial knee joint: Z96.659

## 2015-11-26 ENCOUNTER — Ambulatory Visit (INDEPENDENT_AMBULATORY_CARE_PROVIDER_SITE_OTHER): Payer: Medicare HMO | Admitting: Urology

## 2015-11-26 ENCOUNTER — Encounter: Payer: Self-pay | Admitting: Urology

## 2015-11-26 VITALS — BP 118/77 | HR 65 | Ht 63.0 in | Wt 118.7 lb

## 2015-11-26 DIAGNOSIS — F32A Depression, unspecified: Secondary | ICD-10-CM

## 2015-11-26 DIAGNOSIS — R3129 Other microscopic hematuria: Secondary | ICD-10-CM | POA: Diagnosis not present

## 2015-11-26 DIAGNOSIS — F329 Major depressive disorder, single episode, unspecified: Secondary | ICD-10-CM | POA: Insufficient documentation

## 2015-11-26 DIAGNOSIS — G43909 Migraine, unspecified, not intractable, without status migrainosus: Secondary | ICD-10-CM

## 2015-11-26 DIAGNOSIS — N3941 Urge incontinence: Secondary | ICD-10-CM | POA: Diagnosis not present

## 2015-11-26 DIAGNOSIS — R8290 Unspecified abnormal findings in urine: Secondary | ICD-10-CM | POA: Diagnosis not present

## 2015-11-26 DIAGNOSIS — E785 Hyperlipidemia, unspecified: Secondary | ICD-10-CM

## 2015-11-26 DIAGNOSIS — R350 Frequency of micturition: Secondary | ICD-10-CM | POA: Diagnosis not present

## 2015-11-26 DIAGNOSIS — R42 Dizziness and giddiness: Secondary | ICD-10-CM

## 2015-11-26 DIAGNOSIS — N393 Stress incontinence (female) (male): Secondary | ICD-10-CM | POA: Diagnosis not present

## 2015-11-26 DIAGNOSIS — M810 Age-related osteoporosis without current pathological fracture: Secondary | ICD-10-CM

## 2015-11-26 HISTORY — DX: Depression, unspecified: F32.A

## 2015-11-26 HISTORY — DX: Migraine, unspecified, not intractable, without status migrainosus: G43.909

## 2015-11-26 HISTORY — DX: Dizziness and giddiness: R42

## 2015-11-26 HISTORY — DX: Age-related osteoporosis without current pathological fracture: M81.0

## 2015-11-26 HISTORY — DX: Hyperlipidemia, unspecified: E78.5

## 2015-11-26 MED ORDER — MIRABEGRON ER 25 MG PO TB24: 25.0000 mg | ORAL_TABLET | Freq: Every day | ORAL | Status: DC

## 2015-11-26 NOTE — Progress Notes (Signed)
11/26/2015 5:28 PM   Gabriella Rogers 13-Dec-1933 [id number]  Referring provider: Corky Downs, MD [address] [place], [place] [address]  Chief Complaint  Patient presents with  . Urinary Frequency    New Patient    HPI: 82 yo F with urinary incontinence.  She reports leakage with laughing, coughing, and sneezing but also can't get to the bathroom on time.  This has has been doing on for several years.    She does wear pads, 3-4x daily but not saturated.  She drinks about 12 oz of water a day along with multiple other beverages including coffee and soda.    She did try oxybutin in the past but does not think that this has helped.    She is also very concerned today about the odor of her urine.  Specifically, she reports that she was told by her orthopedic surgeon that she smells like urine. She did not noticed the odor herself nor did her daughter who is a Engineer, civil (consulting). Since then, she has noticed that her urine is somewhat strong smelling.  She has only had one UTI as a teenager.  No gross hematuria.    S/p hysterectomy but does have ovaries..  She delivered 4 children, all vaginal deliveries but lose one child shortly after birth.  She does have issues with hemorrhoids.    No vaginal bulging or pain.  She is not sexually active.     She does struggle with short term memory loss and sometimes has trouble remember to go to the bathroom.    Cr 0.86 on 07/2015.     PMH: Past Medical History  Diagnosis Date  . Hypercholesteremia   . Headache, migraine 11/26/2015  . Brain disorder 03/27/2014  . Cervical dystonia 01/24/2012  . Osteoporosis, post-menopausal 11/26/2015  . Clinical depression 11/26/2015  . Dizziness 11/26/2015    Overview:  Chronic, felt secondary to inner ear per ENT evaluation approximately 1996.   . H/O total knee replacement 11/23/2015  . HLD (hyperlipidemia) 11/26/2015    Surgical History: Past Surgical History  Procedure Laterality Date  . Abdominal hysterectomy    .  Ankle surgery Left   . Knee surgery Right     Home Medications:    Medication List       This list is accurate as of: 11/26/15  5:28 PM.  Always use your most recent med list.               aspirin 81 MG tablet  Take by mouth.     CALTRATE 600+D3 SOFT 600-800 MG-UNIT Chew  Generic drug:  Calcium Carb-Cholecalciferol  Chew by mouth.     clonazePAM 0.5 MG tablet  Commonly known as:  KLONOPIN  Take by mouth.     donepezil 10 MG tablet  Commonly known as:  ARICEPT  Take by mouth.     mirabegron ER 25 MG Tb24 tablet  Commonly known as:  MYRBETRIQ  Take 1 tablet (25 mg total) by mouth daily.     PAXIL 10 MG tablet  Generic drug:  PARoxetine  Take by mouth.     simvastatin 20 MG tablet  Commonly known as:  ZOCOR  Take 20 mg by mouth daily. Reported on 11/26/2015     traMADol 50 MG tablet  Commonly known as:  ULTRAM  Take 1 tablet (50 mg total) by mouth every 6 (six) hours as needed.        Allergies:  Allergies  Allergen Reactions  .  Pneumococcal Vaccine Other (See Comments)    WEAKNESS, DISORIENTED, "ARM SORE, RED, HURTING"  . Risedronate Other (See Comments)    REDNESS IN FACE  . Alendronate Rash  . Aleve [Naproxen Sodium] Rash  . Fosamax [Alendronate Sodium] Rash  . Pravastatin Rash    Family History: Family History  Problem Relation Age of Onset  . Kidney cancer Neg Hx   . Hematuria Neg Hx     Social History:  reports that she has never smoked. She does not have any smokeless tobacco history on file. She reports that she does not drink alcohol. Her drug history is not on file.  ROS: UROLOGY Frequent Urination?: Yes Hard to postpone urination?: Yes Burning/pain with urination?: No Get up at night to urinate?: Yes Leakage of urine?: Yes Urine stream starts and stops?: Yes Trouble starting stream?: No Do you have to strain to urinate?: No Blood in urine?: No Urinary tract infection?: No Sexually transmitted disease?: No Injury to kidneys or  bladder?: No Painful intercourse?: No Weak stream?: No Currently pregnant?: No Vaginal bleeding?: No Last menstrual period?: No  Gastrointestinal Nausea?: No Vomiting?: No Indigestion/heartburn?: Yes Diarrhea?: Yes Constipation?: No  Constitutional Fever: No Night sweats?: No Weight loss?: No Fatigue?: Yes  Skin Skin rash/lesions?: No Itching?: No  Eyes Blurred vision?: No Double vision?: No  Ears/Nose/Throat Sore throat?: No Sinus problems?: Yes  Hematologic/Lymphatic Swollen glands?: No Easy bruising?: Yes  Cardiovascular Leg swelling?: No Chest pain?: No  Respiratory Cough?: No Shortness of breath?: No  Endocrine Excessive thirst?: No  Musculoskeletal Back pain?: Yes Joint pain?: Yes  Neurological Headaches?: No Dizziness?: Yes  Psychologic Depression?: Yes Anxiety?: No  Physical Exam: BP 118/77 mmHg  Pulse 65  Ht 5\' 3"  (1.6 m)  Wt 118 lb 11.2 oz (53.842 kg)  BMI 21.03 kg/m2  Constitutional:  Alert and oriented, No acute distress. HEENT: [place] AT, moist mucus membranes.  Trachea midline, no masses. Cardiovascular: No clubbing, cyanosis, or edema. Respiratory: Normal respiratory effort, no increased work of breathing. GI: Abdomen is soft, nontender, nondistended, no abdominal masses GU: No CVA tenderness.  Pelvic: deferred today, consider in future.  Skin: No rashes, bruises or suspicious lesions. Lymph: No cervical l adenopathy. Neurologic: Grossly intact, no focal deficits, moving all 4 extremities. Psychiatric: Normal mood and affect.  Laboratory Data: Lab Results  Component Value Date   WBC 6.2 02/25/2014   HGB 12.1 02/25/2014   HCT 36.9 02/25/2014   MCV 104* 02/25/2014   PLT 344 02/25/2014    Lab Results  Component Value Date   CREATININE 0.89 02/25/2014   Urinalysis UA today 3-10 RBC/ HPF but greater than 10 epithelial cells suggestive of contamination.  Pertinent Imaging: n/a  Assessment & Plan:    1. Urge  incontinence Discussed options for management of urge incontinence. We discussed behavioral modifications at length today including avoidance of irritating beverages.  At her issues with memory, I would recommend avoiding anticholinergic medications.  Discussed trial of Mybetriq 25 mg for overactivity. I would like her to return in 6 weeks for postvoid residual and reassess her symptoms.  - Urinalysis, Complete  2. Stress incontinence, female Reviewed Kegel exercises, recommend performing these on a regular basis for stress symptoms  3. Foul smelling urine No evidence of infection. This is likely secondary to stagnant urine in pads.  Recommend copious water intake for dilutional purposes.  4. Microscopic hematuria Incidental microscopic hematuria on UA today although this Appears to be contaminated with greater than 10 epithelial cells.  Review of previous UAs at [facility] showed no blood on dip. As such, I would recommend a catheterized urine sample and neck visit to rule out true microscopic hematuria. We'll defer workup at this time pending repeat UA.  Return in about 6 weeks (around 01/07/2016) for PVR, repeat UA (cathed specimen), recheck on symptoms.  [name], MD  [facility] [address], [address] [place], [place] [address] [phone]

## 2015-11-27 LAB — URINALYSIS, COMPLETE
BILIRUBIN UA: NEGATIVE
GLUCOSE, UA: NEGATIVE
Ketones, UA: NEGATIVE
LEUKOCYTES UA: NEGATIVE
Nitrite, UA: NEGATIVE
PROTEIN UA: NEGATIVE
Specific Gravity, UA: 1.025 (ref 1.005–1.030)
UUROB: 0.2 mg/dL (ref 0.2–1.0)
pH, UA: 5.5 (ref 5.0–7.5)

## 2015-11-27 LAB — MICROSCOPIC EXAMINATION: BACTERIA UA: NONE SEEN

## 2015-12-16 DIAGNOSIS — E785 Hyperlipidemia, unspecified: Secondary | ICD-10-CM | POA: Diagnosis not present

## 2015-12-16 DIAGNOSIS — G243 Spasmodic torticollis: Secondary | ICD-10-CM | POA: Diagnosis not present

## 2015-12-24 IMAGING — CR DG HIP COMPLETE 2+V*L*
1 series · 3 of 3 positions shown · non-contrast
Comparison: 11/21/2011

CLINICAL DATA: Fall, left hip pain

EXAM:
LEFT HIP - COMPLETE 2+ VIEW

[Series 1: dxr hip left complete · 0.14mm/px · 3 of 3 slices shown]
[im 1/3]
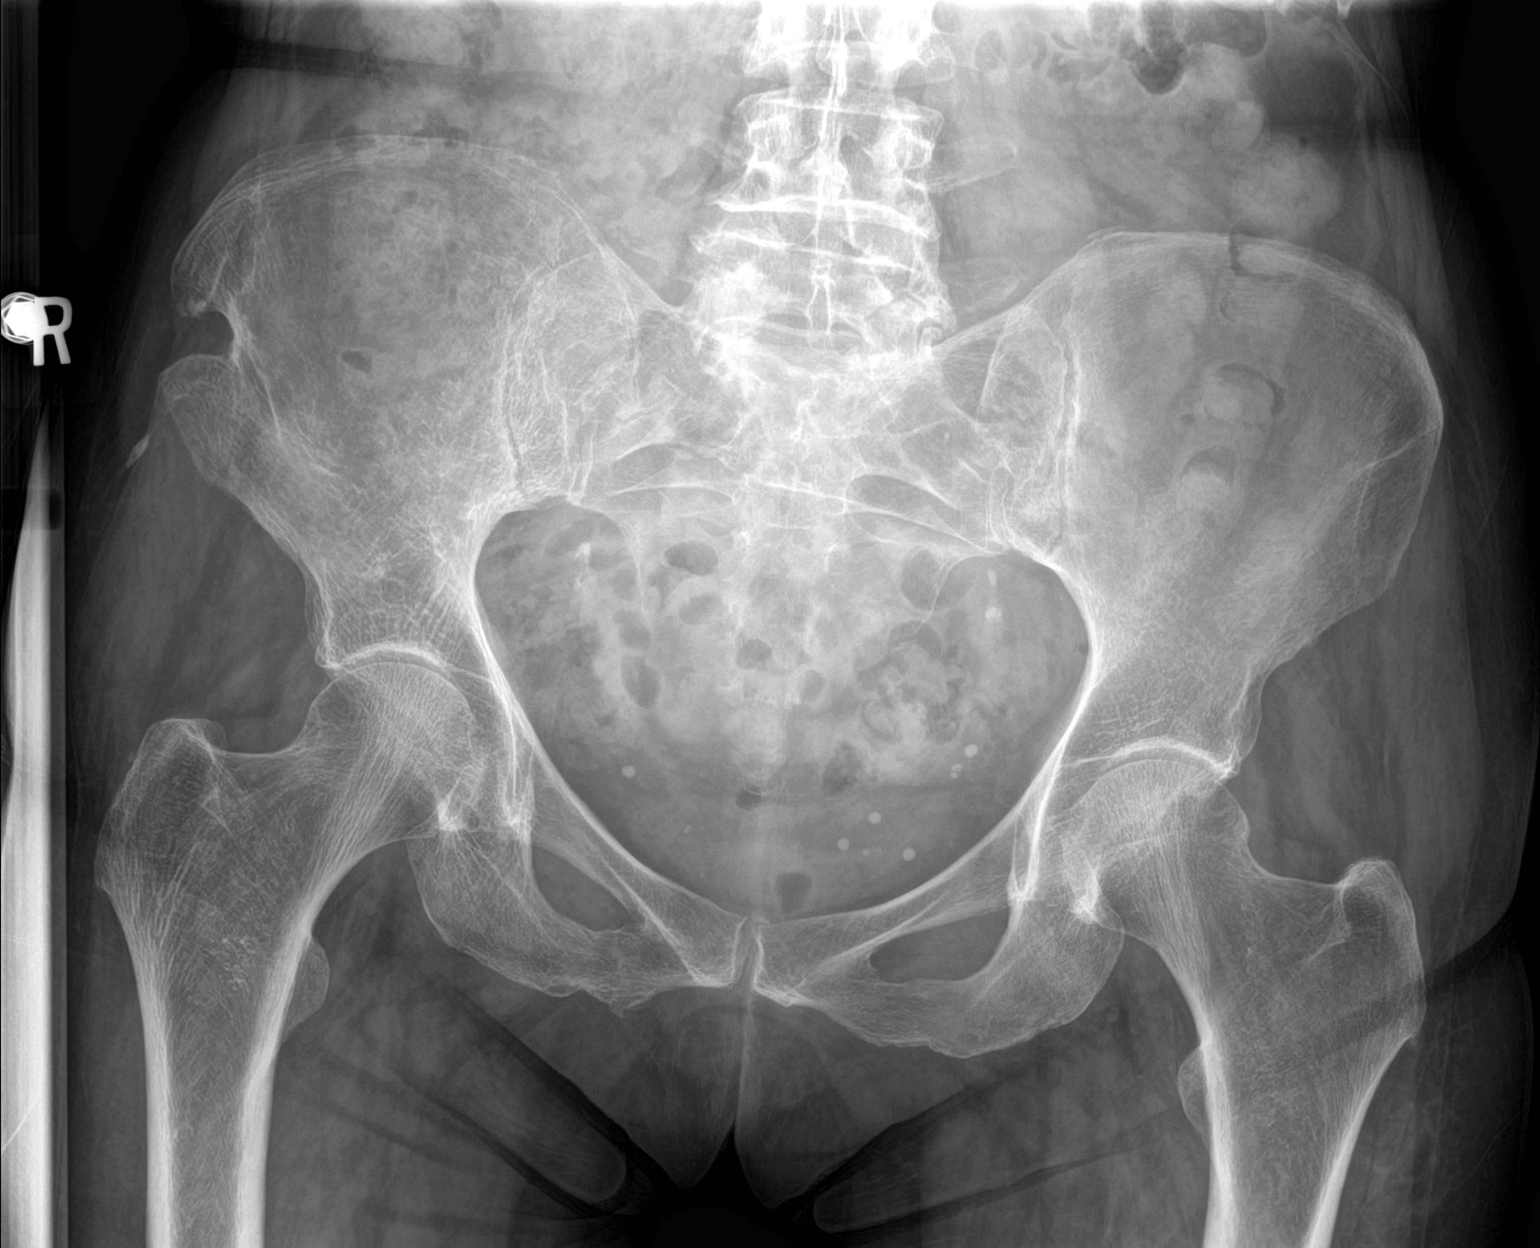
[im 2/3]
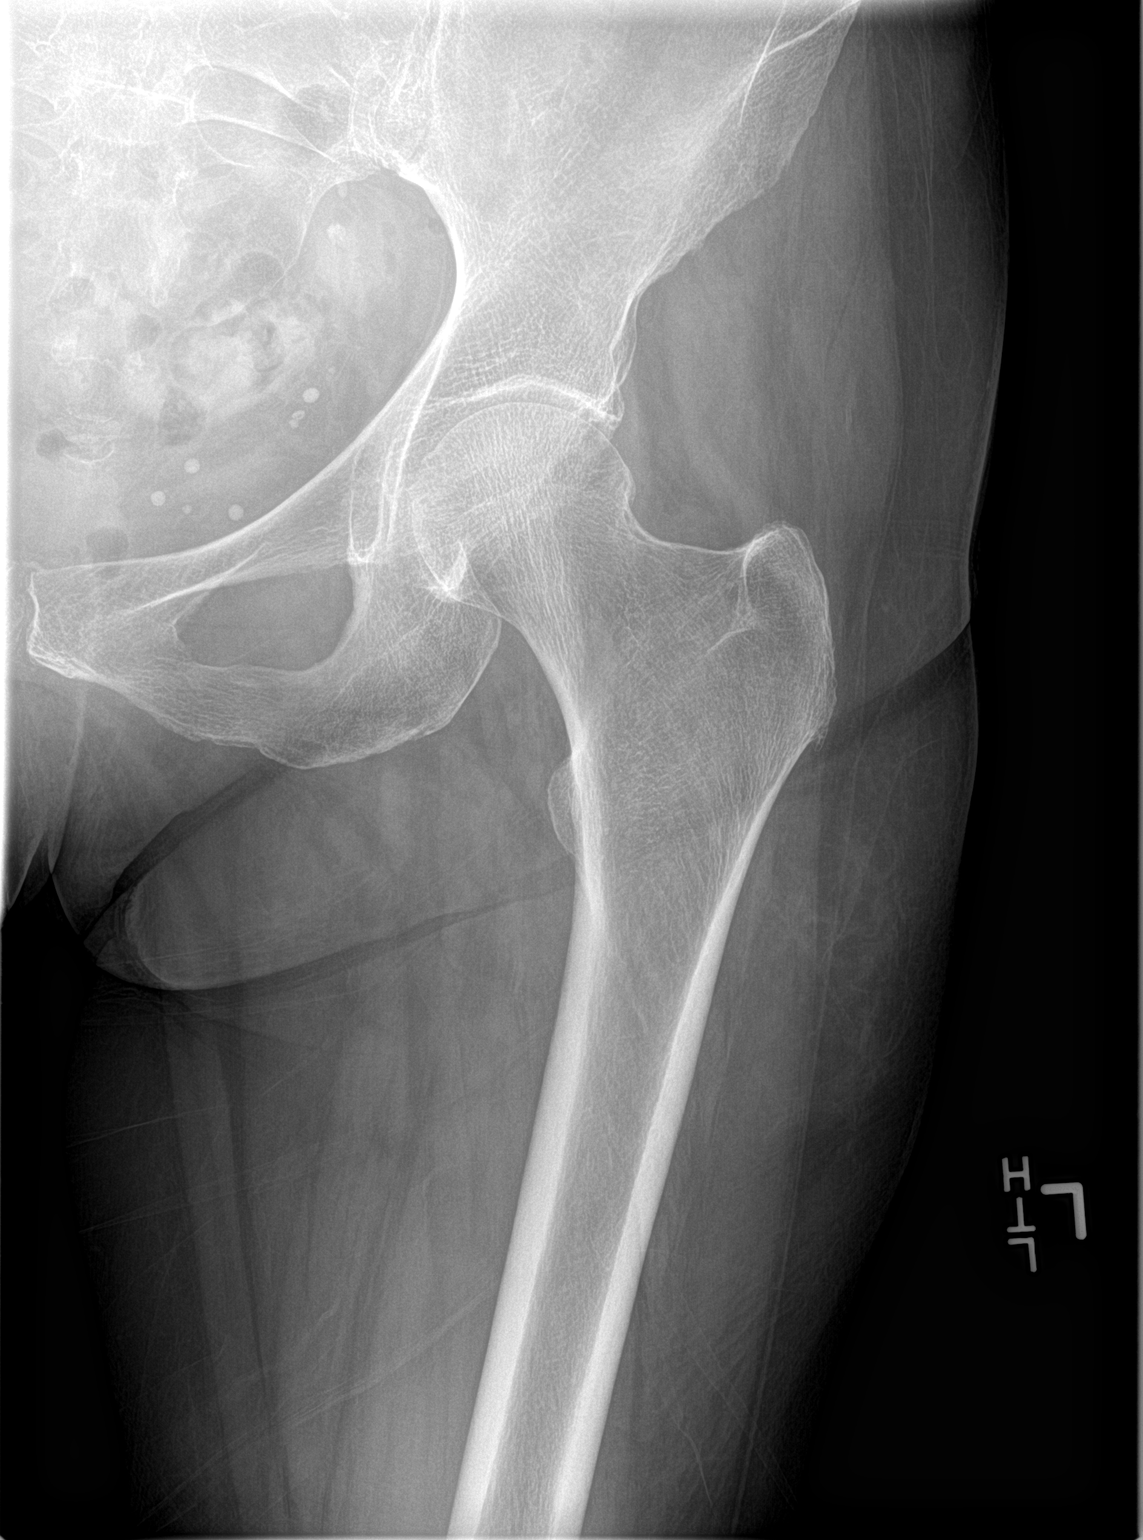
[im 3/3]
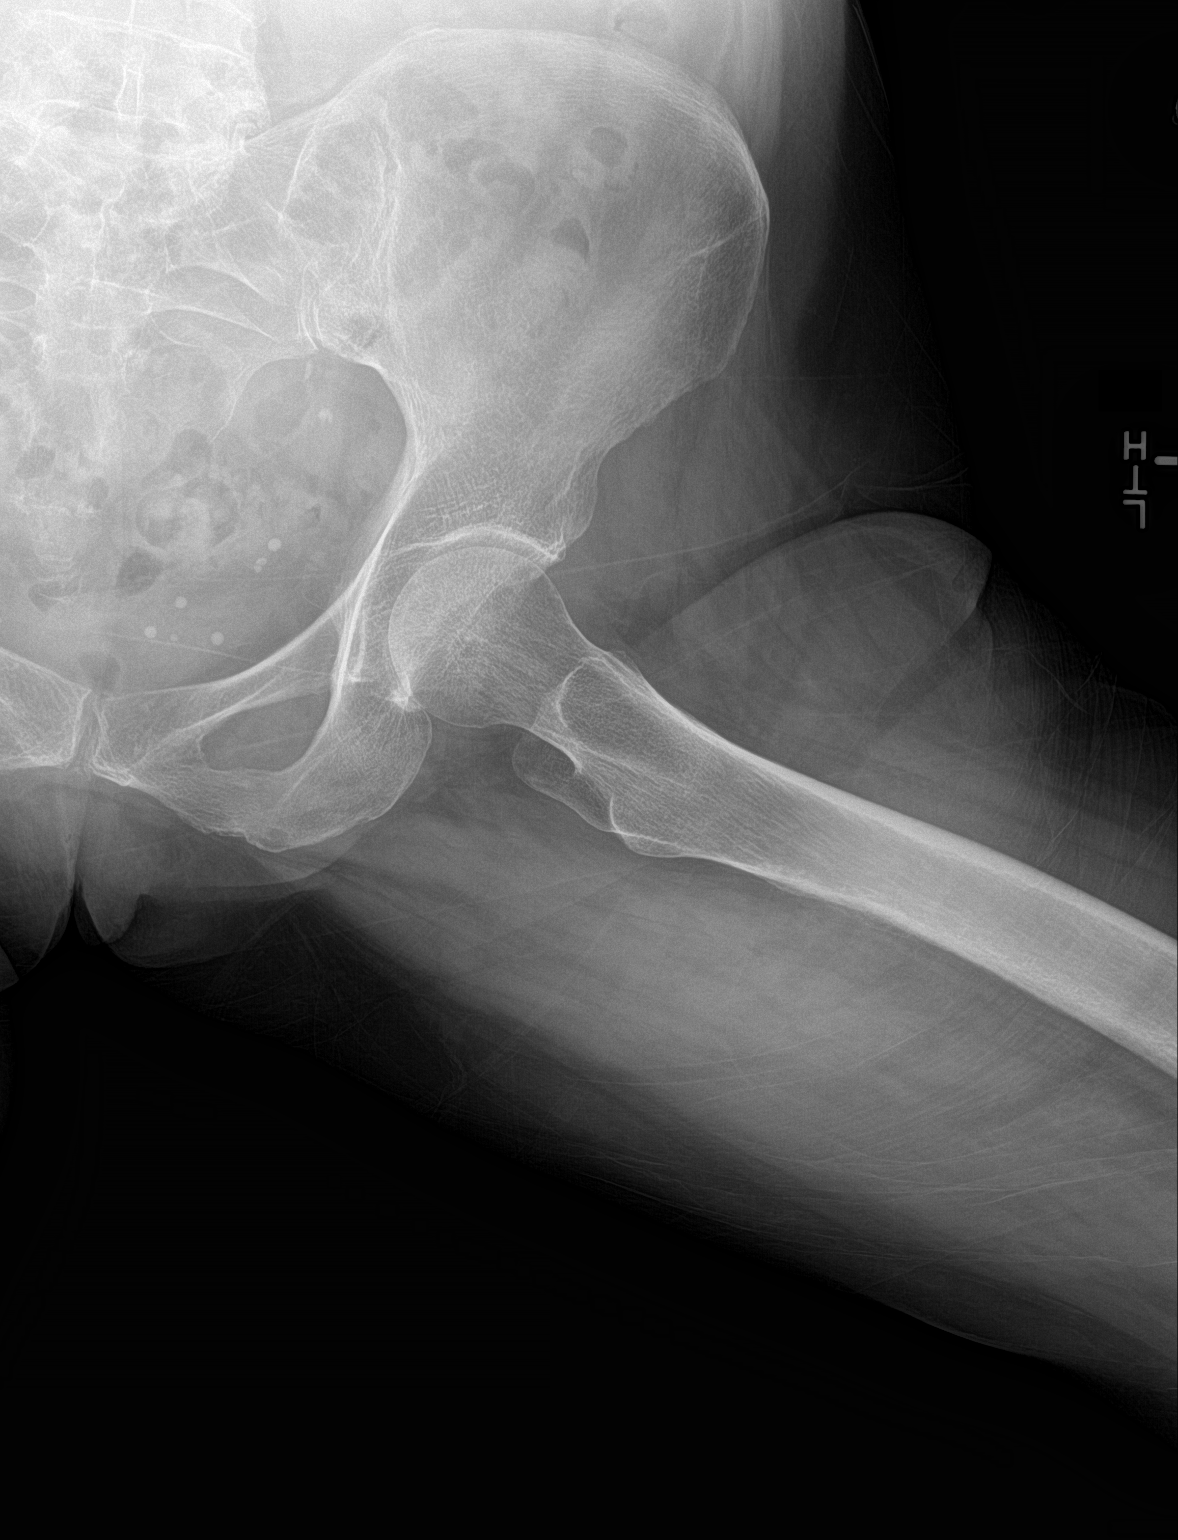

[3 of 3 positions shown; findings below may reference images not displayed]

FINDINGS: There is no evidence of hip fracture or dislocation. There is no
evidence of arthropathy or other focal bone abnormality.
IMPRESSION: Negative.

## 2016-01-01 DIAGNOSIS — G243 Spasmodic torticollis: Secondary | ICD-10-CM | POA: Diagnosis not present

## 2016-01-01 DIAGNOSIS — T63301S Toxic effect of unspecified spider venom, accidental (unintentional), sequela: Secondary | ICD-10-CM | POA: Diagnosis not present

## 2016-01-01 DIAGNOSIS — E785 Hyperlipidemia, unspecified: Secondary | ICD-10-CM | POA: Diagnosis not present

## 2016-01-07 ENCOUNTER — Ambulatory Visit: Payer: Medicare HMO | Admitting: Urology

## 2016-01-15 ENCOUNTER — Encounter: Payer: Self-pay | Admitting: Urology

## 2016-01-15 ENCOUNTER — Ambulatory Visit (INDEPENDENT_AMBULATORY_CARE_PROVIDER_SITE_OTHER): Payer: Medicare HMO | Admitting: Urology

## 2016-01-15 VITALS — BP 121/71 | HR 66 | Ht 63.0 in | Wt 118.3 lb

## 2016-01-15 DIAGNOSIS — N393 Stress incontinence (female) (male): Secondary | ICD-10-CM

## 2016-01-15 DIAGNOSIS — R3129 Other microscopic hematuria: Secondary | ICD-10-CM | POA: Diagnosis not present

## 2016-01-15 DIAGNOSIS — N3941 Urge incontinence: Secondary | ICD-10-CM | POA: Diagnosis not present

## 2016-01-15 LAB — BLADDER SCAN AMB NON-IMAGING: SCAN RESULT: 11

## 2016-01-15 MED ORDER — MIRABEGRON ER 25 MG PO TB24: 50.0000 mg | ORAL_TABLET | Freq: Every day | ORAL | 3 refills | Status: DC

## 2016-01-15 NOTE — Progress Notes (Signed)
01/15/2016 12:07 PM   Gabriella Rogers 08-Jun-1934 [id number]   Referring provider: Corky Downs, MD [address] [place], [place] [address]  Chief Complaint  Patient presents with  . Follow-up    urge incontinence    HPI: 82 yo F who returns today for f/u urinary symptoms including urinary incontinence, both stress and urge. Last visit, she was given Mybetriq 25 mg. She noticed no difference in her symptoms.   No side effects from medications.    She does wear pads, 2-3 x (last time report 3-4x) daily but not saturated.  She drinks about 12 oz of water a day along with multiple other beverages including coffee and soda.  She has not changed her habits since last visit.   Nocturia x1.    She did try oxybutin in the past but does not think that this has helped.    She does struggle with short term memory loss and sometimes has trouble remember to go to the bathroom.    Incidental microscopic hematuria x 1 last visit (possibly contaminated specimen).  No history of gross hematuria or UTIs.    PMH: Past Medical History:  Diagnosis Date  . Brain disorder 03/27/2014  . Cervical dystonia 01/24/2012  . Clinical depression 11/26/2015  . Dizziness 11/26/2015   Overview:  Chronic, felt secondary to inner ear per ENT evaluation approximately 1996.   . H/O total knee replacement 11/23/2015  . Headache, migraine 11/26/2015  . HLD (hyperlipidemia) 11/26/2015  . Hypercholesteremia   . Osteoporosis, post-menopausal 11/26/2015    Surgical History: Past Surgical History:  Procedure Laterality Date  . ABDOMINAL HYSTERECTOMY    . ANKLE SURGERY Left   . KNEE SURGERY Right     Home Medications:    Medication List       Accurate as of 01/15/16 12:07 PM. Always use your most recent med list.          aspirin 81 MG tablet Take by mouth.   CALTRATE 600+D3 SOFT 600-800 MG-UNIT Chew Generic drug:  Calcium Carb-Cholecalciferol Chew by mouth.   clonazePAM 0.5 MG tablet Commonly known as:   KLONOPIN Take by mouth.   donepezil 10 MG tablet Commonly known as:  ARICEPT Take by mouth.   mirabegron ER 25 MG Tb24 tablet Commonly known as:  MYRBETRIQ Take 2 tablets (50 mg total) by mouth daily.   PAXIL 10 MG tablet Generic drug:  PARoxetine Take by mouth.   simvastatin 20 MG tablet Commonly known as:  ZOCOR Take 20 mg by mouth daily. Reported on 11/26/2015   traMADol 50 MG tablet Commonly known as:  ULTRAM Take 1 tablet (50 mg total) by mouth every 6 (six) hours as needed.       Allergies:  Allergies  Allergen Reactions  . Pneumococcal Vaccine Other (See Comments)    WEAKNESS, DISORIENTED, "ARM SORE, RED, HURTING"  . Risedronate Other (See Comments)    REDNESS IN FACE  . Alendronate Rash  . Aleve [Naproxen Sodium] Rash  . Fosamax [Alendronate Sodium] Rash  . Pravastatin Rash    Family History: Family History  Problem Relation Age of Onset  . Kidney cancer Neg Hx   . Hematuria Neg Hx     Social History:  reports that she has never smoked. She has never used smokeless tobacco. She reports that she does not drink alcohol. Her drug history is not on file.  ROS: UROLOGY Frequent Urination?: Yes Hard to postpone urination?: No Burning/pain with urination?: No Get up at  night to urinate?: Yes Leakage of urine?: Yes Urine stream starts and stops?: No Trouble starting stream?: No Do you have to strain to urinate?: No Blood in urine?: No Urinary tract infection?: No Sexually transmitted disease?: No Injury to kidneys or bladder?: No Painful intercourse?: No Weak stream?: No Currently pregnant?: No Vaginal bleeding?: No Last menstrual period?: n  Gastrointestinal Nausea?: No Vomiting?: No Indigestion/heartburn?: No Diarrhea?: Yes Constipation?: No  Constitutional Fever: No Night sweats?: No Weight loss?: No Fatigue?: Yes  Skin Skin rash/lesions?: No Itching?: No  Eyes Blurred vision?: No Double vision?: No  Ears/Nose/Throat Sore  throat?: No Sinus problems?: Yes  Hematologic/Lymphatic Swollen glands?: No Easy bruising?: No  Cardiovascular Leg swelling?: No Chest pain?: No  Respiratory Cough?: No Shortness of breath?: No  Endocrine Excessive thirst?: No  Musculoskeletal Back pain?: No Joint pain?: No  Neurological Headaches?: No Dizziness?: Yes  Psychologic Depression?: No Anxiety?: Yes  Physical Exam: BP 121/71 (BP Location: Left Arm, Patient Position: Sitting, Cuff Size: Normal)   Pulse 66   Ht 5\' 3"  (1.6 m)   Wt 118 lb 4.8 oz (53.7 kg)   BMI 20.96 kg/m   Constitutional:  Alert and oriented, No acute distress. HEENT: [place] AT, moist mucus membranes.  Trachea midline, no masses. Cardiovascular: No clubbing, cyanosis, or edema. Respiratory: Normal respiratory effort, no increased work of breathing. GI: Abdomen is soft, nontender, nondistended, no abdominal masses GU: No CVA tenderness.  Pelvic: deferred today, consider in future.  Skin: No rashes, bruises or suspicious lesions. Lymph: No cervical l adenopathy. Neurologic: Grossly intact, no focal deficits, moving all 4 extremities. Psychiatric: Normal mood and affect.  Laboratory Data: Lab Results  Component Value Date   WBC 6.2 02/25/2014   HGB 12.1 02/25/2014   HCT 36.9 02/25/2014   MCV 104 (H) 02/25/2014   PLT 344 02/25/2014    Lab Results  Component Value Date   CREATININE 0.89 02/25/2014   Urinalysis Patient voided prior to visit today  Pertinent Imaging: Results for orders placed or performed in visit on 01/15/16  Bladder Scan (Post Void Residual) in office  Result Value Ref Range   Scan Result 11     Assessment & Plan:    1. Urge incontinence Mybetriq 50 mg for overactivity- will try higher dose, samples x 2 weeks given and sent to pharmacy Advised to call if no effect  2. Stress incontinence, female Encouraged Kegel exercises, recommend performing these on a regular basis for stress symptoms- does not recall  discussing this last visit  3. Microscopic hematuria Incidental microscopic hematuria on last visit which appeared to be contaminated.   Will arrange for catheterized specimen next visit  Return in about 3 months (around 04/16/2016) for cathed UA/ symptoms recheck.  Gabriella Scotland, MD  [facility] [address] [place], [place] [address] [phone]

## 2016-01-29 DIAGNOSIS — G243 Spasmodic torticollis: Secondary | ICD-10-CM | POA: Diagnosis not present

## 2016-03-17 DIAGNOSIS — E785 Hyperlipidemia, unspecified: Secondary | ICD-10-CM | POA: Diagnosis not present

## 2016-03-17 DIAGNOSIS — G243 Spasmodic torticollis: Secondary | ICD-10-CM | POA: Diagnosis not present

## 2016-03-17 DIAGNOSIS — R55 Syncope and collapse: Secondary | ICD-10-CM | POA: Diagnosis not present

## 2016-03-18 DIAGNOSIS — R5382 Chronic fatigue, unspecified: Secondary | ICD-10-CM | POA: Diagnosis not present

## 2016-03-18 DIAGNOSIS — G243 Spasmodic torticollis: Secondary | ICD-10-CM | POA: Diagnosis not present

## 2016-03-18 DIAGNOSIS — E785 Hyperlipidemia, unspecified: Secondary | ICD-10-CM | POA: Diagnosis not present

## 2016-03-18 DIAGNOSIS — R55 Syncope and collapse: Secondary | ICD-10-CM | POA: Diagnosis not present

## 2016-03-22 DIAGNOSIS — G243 Spasmodic torticollis: Secondary | ICD-10-CM | POA: Diagnosis not present

## 2016-03-22 DIAGNOSIS — E785 Hyperlipidemia, unspecified: Secondary | ICD-10-CM | POA: Diagnosis not present

## 2016-03-22 DIAGNOSIS — R42 Dizziness and giddiness: Secondary | ICD-10-CM | POA: Diagnosis not present

## 2016-03-22 DIAGNOSIS — R5382 Chronic fatigue, unspecified: Secondary | ICD-10-CM | POA: Diagnosis not present

## 2016-03-22 DIAGNOSIS — R55 Syncope and collapse: Secondary | ICD-10-CM | POA: Diagnosis not present

## 2016-03-29 DIAGNOSIS — G5602 Carpal tunnel syndrome, left upper limb: Secondary | ICD-10-CM | POA: Diagnosis not present

## 2016-03-29 DIAGNOSIS — R4189 Other symptoms and signs involving cognitive functions and awareness: Secondary | ICD-10-CM | POA: Diagnosis not present

## 2016-03-29 DIAGNOSIS — G243 Spasmodic torticollis: Secondary | ICD-10-CM | POA: Diagnosis not present

## 2016-04-21 ENCOUNTER — Ambulatory Visit: Payer: Medicare HMO | Admitting: Urology

## 2016-04-22 DIAGNOSIS — H04203 Unspecified epiphora, bilateral lacrimal glands: Secondary | ICD-10-CM | POA: Diagnosis not present

## 2016-04-27 ENCOUNTER — Ambulatory Visit: Payer: Medicare HMO | Admitting: Urology

## 2016-04-27 ENCOUNTER — Encounter: Payer: Self-pay | Admitting: Urology

## 2016-04-27 VITALS — BP 154/93 | HR 69 | Ht 63.0 in | Wt 122.0 lb

## 2016-04-27 DIAGNOSIS — R3129 Other microscopic hematuria: Secondary | ICD-10-CM | POA: Diagnosis not present

## 2016-04-27 DIAGNOSIS — N393 Stress incontinence (female) (male): Secondary | ICD-10-CM

## 2016-04-27 DIAGNOSIS — N3941 Urge incontinence: Secondary | ICD-10-CM | POA: Diagnosis not present

## 2016-04-27 LAB — URINALYSIS, COMPLETE
BILIRUBIN UA: NEGATIVE
GLUCOSE, UA: NEGATIVE
KETONES UA: NEGATIVE
Leukocytes, UA: NEGATIVE
NITRITE UA: NEGATIVE
Protein, UA: NEGATIVE
SPEC GRAV UA: 1.025 (ref 1.005–1.030)
Urobilinogen, Ur: 0.2 mg/dL (ref 0.2–1.0)
pH, UA: 5.5 (ref 5.0–7.5)

## 2016-04-27 LAB — MICROSCOPIC EXAMINATION
Bacteria, UA: NONE SEEN
WBC, UA: NONE SEEN /hpf (ref 0–?)

## 2016-04-27 LAB — BLADDER SCAN AMB NON-IMAGING: Scan Result: 27

## 2016-04-27 MED ORDER — ESTROGENS, CONJUGATED 0.625 MG/GM VA CREA: 1.0000 | TOPICAL_CREAM | Freq: Every day | VAGINAL | 12 refills | Status: DC

## 2016-04-27 MED ORDER — MIRABEGRON ER 50 MG PO TB24: 50.0000 mg | ORAL_TABLET | Freq: Every day | ORAL | 3 refills | Status: DC

## 2016-04-27 NOTE — Progress Notes (Signed)
04/27/2016 7:28 PM   Gabriella HopperSylvia C Rogers 01/20/1934 [id number]  Referring provider: Corky DownsJaved Masoud, MD [address] [place], [place] [id number]  Chief Complaint  Patient presents with  . Hematuria    HPI: 82 year old female who returns today for follow-up of mixed urinary incontinence as well as possible microscopic hematuria.  Patient has been on Mybetriq 50 mg daily and has noticed significant improvement in her urge symptoms. Occasionally, she will be sitting in her lounge chair and not get up for several hours. When she does get up, she has severe urge and occasional urge incontinence.    She does continue to have issues with her memory and often forgets to go to the bathroom.  She notes that when it's been a prolonged time since her last void, she has worse urge and urge incontinence symptoms.  She continues to have mild stress incontinence which is not particularly bothersome to her.  On previous visits, there was suspicion for microhematuria although specimens appeared contaminated. Catheterized specimen was obtained today to further evaluate this.  UA today showed no evidence of microscopic hematuria.   PMH: Past Medical History:  Diagnosis Date  . Brain disorder 03/27/2014  . Cervical dystonia 01/24/2012  . Clinical depression 11/26/2015  . Dizziness 11/26/2015   Overview:  Chronic, felt secondary to inner ear per ENT evaluation approximately 1996.   . H/O total knee replacement 11/23/2015  . Headache, migraine 11/26/2015  . HLD (hyperlipidemia) 11/26/2015  . Hypercholesteremia   . Osteoporosis, post-menopausal 11/26/2015    Surgical History: Past Surgical History:  Procedure Laterality Date  . ABDOMINAL HYSTERECTOMY    . ANKLE SURGERY Left   . KNEE SURGERY Right     Home Medications:    Medication List       Accurate as of 04/27/16  7:28 PM. Always use your most recent med list.          aspirin 81 MG tablet Take by mouth.   CALTRATE 600+D3 SOFT 600-800 MG-UNIT  Chew Generic drug:  Calcium Carb-Cholecalciferol Chew by mouth.   clonazePAM 0.5 MG tablet Commonly known as:  KLONOPIN Take by mouth.   donepezil 10 MG tablet Commonly known as:  ARICEPT Take by mouth.   mirabegron ER 50 MG Tb24 tablet Commonly known as:  MYRBETRIQ Take 1 tablet (50 mg total) by mouth daily.   PAXIL 10 MG tablet Generic drug:  PARoxetine Take by mouth.   simvastatin 20 MG tablet Commonly known as:  ZOCOR Take 20 mg by mouth daily. Reported on 11/26/2015   traMADol 50 MG tablet Commonly known as:  ULTRAM Take 1 tablet (50 mg total) by mouth every 6 (six) hours as needed.       Allergies:  Allergies  Allergen Reactions  . Pneumococcal Vaccine Other (See Comments)    WEAKNESS, DISORIENTED, "ARM SORE, RED, HURTING"  . Risedronate Other (See Comments)    REDNESS IN FACE  . Alendronate Rash  . Aleve [Naproxen Sodium] Rash  . Fosamax [Alendronate Sodium] Rash  . Pravastatin Rash    Family History: Family History  Problem Relation Age of Onset  . Kidney cancer Neg Hx   . Hematuria Neg Hx     Social History:  reports that she has never smoked. She has never used smokeless tobacco. She reports that she does not drink alcohol. Her drug history is not on file.  ROS: UROLOGY Frequent Urination?: Yes Hard to postpone urination?: Yes Burning/pain with urination?: No Get up at night to urinate?:  Yes Leakage of urine?: Yes Urine stream starts and stops?: Yes Trouble starting stream?: No Do you have to strain to urinate?: No Blood in urine?: No Urinary tract infection?: No Sexually transmitted disease?: No Injury to kidneys or bladder?: No Painful intercourse?: No Weak stream?: No Currently pregnant?: No Vaginal bleeding?: No Last menstrual period?: n  Gastrointestinal Nausea?: No Vomiting?: No Indigestion/heartburn?: No Diarrhea?: Yes Constipation?: No  Constitutional Fever: No Night sweats?: No Weight loss?: No Fatigue?:  No  Skin Skin rash/lesions?: No Itching?: No  Eyes Blurred vision?: No Double vision?: No  Ears/Nose/Throat Sore throat?: No Sinus problems?: No  Hematologic/Lymphatic Swollen glands?: No Easy bruising?: No  Cardiovascular Leg swelling?: No Chest pain?: No  Respiratory Cough?: No Shortness of breath?: No  Endocrine Excessive thirst?: No  Musculoskeletal Back pain?: No Joint pain?: Yes  Neurological Headaches?: No Dizziness?: No  Psychologic Depression?: No Anxiety?: Yes  Physical Exam: BP (!) 154/93 (BP Location: Left Arm, Patient Position: Sitting, Cuff Size: Normal)   Pulse 69   Ht 5\' 3"  (1.6 m)   Wt 122 lb (55.3 kg)   BMI 21.61 kg/m   Constitutional:  Alert and oriented, No acute distress. HEENT: [place] AT, moist mucus membranes.  Trachea midline, no masses. Cardiovascular: No clubbing, cyanosis, or edema. Respiratory: Normal respiratory effort, no increased work of breathing. Skin: No rashes, bruises or suspicious lesions. Neurologic: Grossly intact, no focal deficits, moving all 4 extremities. Psychiatric: Normal mood and affect.  Laboratory Data: Lab Results  Component Value Date   WBC 6.2 02/25/2014   HGB 12.1 02/25/2014   HCT 36.9 02/25/2014   MCV 104 (H) 02/25/2014   PLT 344 02/25/2014    Lab Results  Component Value Date   CREATININE 0.89 02/25/2014   Urinalysis Results for orders placed or performed in visit on 04/27/16  Microscopic Examination  Result Value Ref Range   WBC, UA None seen 0 - 5 /hpf   RBC, UA 0-2 0 - 2 /hpf   Epithelial Cells (non renal) 0-10 0 - 10 /hpf   Bacteria, UA None seen None seen/Few  Urinalysis, Complete  Result Value Ref Range   Specific Gravity, UA 1.025 1.005 - 1.030   pH, UA 5.5 5.0 - 7.5   Color, UA Yellow Yellow   Appearance Ur Clear Clear   Leukocytes, UA Negative Negative   Protein, UA Negative Negative/Trace   Glucose, UA Negative Negative   Ketones, UA Negative Negative   RBC, UA Trace  (A) Negative   Bilirubin, UA Negative Negative   Urobilinogen, Ur 0.2 0.2 - 1.0 mg/dL   Nitrite, UA Negative Negative   Microscopic Examination See below:   Bladder Scan (Post Void Residual) in office  Result Value Ref Range   Scan Result 27     Pertinent Imaging: PVR as above  Assessment & Plan:    1. Urge incontinence Continue Mybetriq 50 mg as this seems to be helping her  We discussed possibly setting a timer for every 3 hours during the daytime to help her remember to get up and urinate. Additional strategies were also discussed for time to void.  Adequate bladder emptying today on postvoid residual.  2. Stress incontinence, female Continue Kegel exercises as previously discussed   3. Microscopic hematuria No evidence of microscopic hematuria today on catheterized specimen. Suspect previous infections with multiple epithelial cells likely contaminant. No indication for additional workup at this time.  Return if symptoms worsen or fail to improve.  Vanna ScotlandAshley Morgane Joerger, MD  [facility] [address], [place], [place] [address] [phone]

## 2016-04-28 NOTE — Progress Notes (Signed)
In and Out Catheterization  Patient is present today for a I & O catheterization due to recurrent UTI. Patient was cleaned and prepped in a sterile fashion with betadine and Lidocaine 2% jelly was instilled into the urethra.  A 14FR cath was inserted no complications were noted , 30ml of urine return was noted, urine was yellow in color. A clean urine sample was collected for UA. Bladder was drained  And catheter was removed with out difficulty.    Preformed by: Teressa Lowerarrie Diallo Ponder, CMA

## 2016-05-05 ENCOUNTER — Telehealth: Payer: Self-pay | Admitting: Urology

## 2016-05-05 NOTE — Telephone Encounter (Signed)
Patient notified and I spoke to the Pharmacist and they will return the Premarin Cream. Patient voiced understanding.

## 2016-05-05 NOTE — Telephone Encounter (Signed)
She is correct. We did not discuss premarin cream.   It looks like something that was ordered but canceled shortly thereafter which was likely inadvertently ordered in the first place and corrected. Her pharmacy must not have gotten the medication cancellation order.  No need for this mediation.    Vanna ScotlandAshley Ahlayah Tarkowski, MD

## 2016-05-05 NOTE — Telephone Encounter (Signed)
Pt picked up meds from pharmacy and is confused as to why she was given Premarin Vag Cream. Pt doesn't remember this being discussed at the appt and doesn't know what it is for. Please advise. Thanks.

## 2016-07-12 DIAGNOSIS — R4189 Other symptoms and signs involving cognitive functions and awareness: Secondary | ICD-10-CM | POA: Diagnosis not present

## 2016-07-12 DIAGNOSIS — E538 Deficiency of other specified B group vitamins: Secondary | ICD-10-CM | POA: Diagnosis not present

## 2016-07-12 DIAGNOSIS — R410 Disorientation, unspecified: Secondary | ICD-10-CM | POA: Diagnosis not present

## 2016-07-12 DIAGNOSIS — E559 Vitamin D deficiency, unspecified: Secondary | ICD-10-CM | POA: Diagnosis not present

## 2016-07-12 DIAGNOSIS — G243 Spasmodic torticollis: Secondary | ICD-10-CM | POA: Diagnosis not present

## 2016-07-13 ENCOUNTER — Other Ambulatory Visit: Payer: Self-pay | Admitting: Neurology

## 2016-07-13 DIAGNOSIS — R479 Unspecified speech disturbances: Secondary | ICD-10-CM

## 2016-07-13 DIAGNOSIS — R410 Disorientation, unspecified: Secondary | ICD-10-CM

## 2016-07-20 ENCOUNTER — Encounter: Payer: Self-pay | Admitting: *Deleted

## 2016-07-20 DIAGNOSIS — Y999 Unspecified external cause status: Secondary | ICD-10-CM | POA: Insufficient documentation

## 2016-07-20 DIAGNOSIS — R52 Pain, unspecified: Secondary | ICD-10-CM | POA: Insufficient documentation

## 2016-07-20 DIAGNOSIS — M791 Myalgia: Secondary | ICD-10-CM | POA: Diagnosis not present

## 2016-07-20 DIAGNOSIS — Y9389 Activity, other specified: Secondary | ICD-10-CM | POA: Diagnosis not present

## 2016-07-20 DIAGNOSIS — S0990XA Unspecified injury of head, initial encounter: Secondary | ICD-10-CM | POA: Diagnosis present

## 2016-07-20 DIAGNOSIS — W1839XA Other fall on same level, initial encounter: Secondary | ICD-10-CM | POA: Diagnosis not present

## 2016-07-20 DIAGNOSIS — Z7982 Long term (current) use of aspirin: Secondary | ICD-10-CM | POA: Diagnosis not present

## 2016-07-20 DIAGNOSIS — Y92002 Bathroom of unspecified non-institutional (private) residence single-family (private) house as the place of occurrence of the external cause: Secondary | ICD-10-CM | POA: Insufficient documentation

## 2016-07-20 DIAGNOSIS — R51 Headache: Secondary | ICD-10-CM | POA: Diagnosis not present

## 2016-07-20 DIAGNOSIS — M25551 Pain in right hip: Secondary | ICD-10-CM | POA: Diagnosis not present

## 2016-07-20 DIAGNOSIS — S199XXA Unspecified injury of neck, initial encounter: Secondary | ICD-10-CM | POA: Diagnosis not present

## 2016-07-20 DIAGNOSIS — Z79899 Other long term (current) drug therapy: Secondary | ICD-10-CM | POA: Insufficient documentation

## 2016-07-20 NOTE — ED Triage Notes (Signed)
Pt ambulatory with a cane to triage.  Pt reports standing in the bathroom today and falling backwards.  Pt has pain in right buttock.  Pt reports no loc.  Pt has pain in back of head.  No vomiting.  Pt reports some nausea.  Pt alert.  Speech clear.

## 2016-07-21 ENCOUNTER — Emergency Department: Payer: Medicare HMO

## 2016-07-21 ENCOUNTER — Emergency Department
Admission: EM | Admit: 2016-07-21 | Discharge: 2016-07-21 | Disposition: A | Discharge status: Home / Self Care | Payer: Medicare HMO | Attending: Emergency Medicine | Admitting: Emergency Medicine

## 2016-07-21 DIAGNOSIS — R51 Headache: Secondary | ICD-10-CM | POA: Diagnosis not present

## 2016-07-21 DIAGNOSIS — Z7982 Long term (current) use of aspirin: Secondary | ICD-10-CM | POA: Diagnosis not present

## 2016-07-21 DIAGNOSIS — M25551 Pain in right hip: Secondary | ICD-10-CM | POA: Diagnosis not present

## 2016-07-21 DIAGNOSIS — R52 Pain, unspecified: Secondary | ICD-10-CM | POA: Diagnosis not present

## 2016-07-21 DIAGNOSIS — W19XXXA Unspecified fall, initial encounter: Secondary | ICD-10-CM

## 2016-07-21 DIAGNOSIS — W1839XA Other fall on same level, initial encounter: Secondary | ICD-10-CM | POA: Diagnosis not present

## 2016-07-21 DIAGNOSIS — M7918 Myalgia, other site: Secondary | ICD-10-CM

## 2016-07-21 DIAGNOSIS — S199XXA Unspecified injury of neck, initial encounter: Secondary | ICD-10-CM | POA: Diagnosis not present

## 2016-07-21 DIAGNOSIS — Z79899 Other long term (current) drug therapy: Secondary | ICD-10-CM | POA: Diagnosis not present

## 2016-07-21 LAB — BASIC METABOLIC PANEL
ANION GAP: 6 (ref 5–15)
BUN: 16 mg/dL (ref 6–20)
CHLORIDE: 107 mmol/L (ref 101–111)
CO2: 26 mmol/L (ref 22–32)
Calcium: 9 mg/dL (ref 8.9–10.3)
Creatinine, Ser: 0.98 mg/dL (ref 0.44–1.00)
GFR calc Af Amer: >60 mL/min (ref >60)
GFR calc non Af Amer: 53 mL/min — ABNORMAL LOW (ref >60)
Glucose, Bld: 127 mg/dL — ABNORMAL HIGH (ref 65–99)
POTASSIUM: 4.2 mmol/L (ref 3.5–5.1)
SODIUM: 139 mmol/L (ref 135–145)

## 2016-07-21 LAB — CBC
HEMATOCRIT: 34.6 % — AB (ref 35.0–47.0)
Hemoglobin: 12.2 g/dL (ref 12.0–16.0)
MCH: 35.2 pg — ABNORMAL HIGH (ref 26.0–34.0)
MCHC: 35.3 g/dL (ref 32.0–36.0)
MCV: 99.7 fL (ref 80.0–100.0)
Platelets: 337 10*3/uL (ref 150–440)
RBC: 3.47 MIL/uL — ABNORMAL LOW (ref 3.80–5.20)
RDW: 13.3 % (ref 11.5–14.5)
WBC: 9.8 10*3/uL (ref 3.6–11.0)

## 2016-07-21 LAB — URINALYSIS, COMPLETE (UACMP) WITH MICROSCOPIC
BILIRUBIN URINE: NEGATIVE
Glucose, UA: NEGATIVE mg/dL
KETONES UR: NEGATIVE mg/dL
Leukocytes, UA: NEGATIVE
NITRITE: NEGATIVE
PROTEIN: NEGATIVE mg/dL
RBC / HPF: NONE SEEN RBC/hpf (ref 0–5)
SPECIFIC GRAVITY, URINE: 1.009 (ref 1.005–1.030)
pH: 6 (ref 5.0–8.0)

## 2016-07-21 LAB — TROPONIN I: Troponin I: <0.03 ng/mL (ref <0.03)

## 2016-07-21 NOTE — ED Provider Notes (Signed)
[facility] Emergency Department Provider Note   ____________________________________________   First MD Initiated Contact with Patient 07/21/16 [phone]     (approximate)  I have reviewed the triage vital signs and the nursing notes.   HISTORY  Chief Complaint Fall    HPI Gabriella Rogers is a 83 y.o. female who comes into the hospital today after a fall. She reports that she fell in the bathroom. She reports she was bent over looking at something and then when she stood up she fell backwards. She fell onto her right bottom. She has fallen like this in the past but she is not completely sure how she fell. The patient did not pass out and she denies hitting her head. She reports that she has an MRI coming up as she has had some TIA symptoms and dizzy spells but she did not have anything like that today. The patient has some pain to her right buttock. She denies any chest pain but her husband said she did look sweaty and looked like she was having some labored breathing when she fell. The patient denies feeling short of breath. The patient rates her pain a 5 out of 10 in intensity. She is here today for evaluation.   Past Medical History:  Diagnosis Date  . Brain disorder 03/27/2014  . Cervical dystonia 01/24/2012  . Clinical depression 11/26/2015  . Dizziness 11/26/2015   Overview:  Chronic, felt secondary to inner ear per ENT evaluation approximately 1996.   . H/O total knee replacement 11/23/2015  . Headache, migraine 11/26/2015  . HLD (hyperlipidemia) 11/26/2015  . Hypercholesteremia   . Osteoporosis, post-menopausal 11/26/2015    Patient Active Problem List   Diagnosis Date Noted  . Depression 11/26/2015  . Dizziness 11/26/2015  . Hyperlipidemia, unspecified 11/26/2015  . Headache, migraine 11/26/2015  . Osteoporosis, post-menopausal 11/26/2015  . H/O total knee replacement 11/23/2015  . Arthropathy, traumatic, knee 03/05/2015  . Brain disorder 03/27/2014  .  Cervical dystonia 01/24/2012    Past Surgical History:  Procedure Laterality Date  . ABDOMINAL HYSTERECTOMY    . ANKLE SURGERY Left   . KNEE SURGERY Right     Prior to Admission medications   Medication Sig Start Date End Date Taking? Authorizing Provider  Calcium Carb-Cholecalciferol (CALTRATE 600+D3 SOFT) 600-800 MG-UNIT CHEW Chew by mouth. 12/23/09  Yes Historical Provider, MD  clonazePAM (KLONOPIN) 0.5 MG tablet Take 0.5 mg by mouth 2 (two) times daily as needed.    Yes Historical Provider, MD  donepezil (ARICEPT) 10 MG tablet Take by mouth. 02/03/15  Yes Historical Provider, MD  aspirin 81 MG tablet Take by mouth. 12/23/09   Historical Provider, MD  mirabegron ER (MYRBETRIQ) 50 MG TB24 tablet Take 1 tablet (50 mg total) by mouth daily. Patient not taking: Reported on 07/21/2016 04/27/16   Vanna ScotlandAshley Brandon, MD  PARoxetine (PAXIL) 10 MG tablet Take by mouth. 07/29/14   Historical Provider, MD  simvastatin (ZOCOR) 20 MG tablet Take 20 mg by mouth daily. Reported on 11/26/2015    Historical Provider, MD  traMADol (ULTRAM) 50 MG tablet Take 1 tablet (50 mg total) by mouth every 6 (six) hours as needed. Patient not taking: Reported on 04/27/2016 06/22/15   Chinita Pesterari B Triplett, FNP    Allergies Pneumococcal vaccine; Risedronate; Alendronate; Aleve [naproxen sodium]; Fosamax [alendronate sodium]; and Pravastatin  Family History  Problem Relation Age of Onset  . Kidney cancer Neg Hx   . Hematuria Neg Hx     Social  History Social History  Substance Use Topics  . Smoking status: Never Smoker  . Smokeless tobacco: Never Used  . Alcohol use No    Review of Systems Constitutional: No fever/chills Eyes: No visual changes. ENT: No sore throat. Cardiovascular: Denies chest pain. Respiratory: Denies shortness of breath. Gastrointestinal: No abdominal pain.  No nausea, no vomiting.  No diarrhea.  No constipation. Genitourinary: Negative for dysuria. Musculoskeletal: Negative for back pain. Skin:  Negative for rash. Neurological: Negative for headaches, focal weakness or numbness.  10-point ROS otherwise negative.  ____________________________________________   PHYSICAL EXAM:  VITAL SIGNS: ED Triage Vitals  Enc Vitals Group     BP 07/20/16 2347 (!) 163/65     Pulse Rate 07/20/16 2347 69     Resp 07/20/16 2347 20     Temp 07/20/16 2347 97.9 F (36.6 C)     Temp Source 07/20/16 2347 Oral     SpO2 07/20/16 2347 100 %     Weight 07/20/16 2349 122 lb (55.3 kg)     Height 07/20/16 2349 5\' 3"  (1.6 m)     Head Circumference --      Peak Flow --      Pain Score 07/20/16 2350 5     Pain Loc --      Pain Edu? --      Excl. in GC? --     Constitutional: Alert and oriented. Well appearing and in Mild distress. Eyes: Conjunctivae are normal. PERRL. EOMI. Head: Atraumatic. Nose: No congestion/rhinnorhea. Mouth/Throat: Mucous membranes are moist.  Oropharynx non-erythematous. Cardiovascular: Normal rate, regular rhythm. Grossly normal heart sounds.  Good peripheral circulation. Respiratory: Normal respiratory effort.  No retractions. Lungs CTAB. Gastrointestinal: Soft and nontender. No distention. Positive bowel sounds Musculoskeletal: No lower extremity tenderness nor edema.  No hip pain to palpation minimal right buttock pain to palpation Neurologic:  Normal speech and language.  Skin:  Skin is warm, dry and intact.  Psychiatric: Mood and affect are normal.   ____________________________________________   LABS (all labs ordered are listed, but only abnormal results are displayed)  Labs Reviewed  BASIC METABOLIC PANEL - Abnormal; Notable for the following:       Result Value   Glucose, Bld 127 (*)    GFR calc non Af Amer 53 (*)    All other components within normal limits  CBC - Abnormal; Notable for the following:    RBC 3.47 (*)    HCT 34.6 (*)    MCH 35.2 (*)    All other components within normal limits  URINALYSIS, COMPLETE (UACMP) WITH MICROSCOPIC - Abnormal;  Notable for the following:    Color, Urine STRAW (*)    APPearance CLEAR (*)    Hgb urine dipstick SMALL (*)    Bacteria, UA RARE (*)    Squamous Epithelial / LPF 0-5 (*)    All other components within normal limits  TROPONIN I  TROPONIN I   ____________________________________________  EKG  ED ECG REPORT I, Rebecka Apley, the attending physician, personally viewed and interpreted this ECG.   Date: 07/20/2016  EKG Time: 2356  Rate: 60  Rhythm: normal sinus rhythm  Axis: normal  Intervals:none  ST&T Change: none  ____________________________________________  RADIOLOGY  CT head and cervical spine DG pelvis  ____________________________________________   PROCEDURES  Procedure(s) performed: None  Procedures  Critical Care performed: No  ____________________________________________   INITIAL IMPRESSION / ASSESSMENT AND PLAN / ED COURSE  Pertinent labs & imaging results that were available  during my care of the patient were reviewed by me and considered in my medical decision making (see chart for details).  This is an 83 year old female who comes into the hospital today after a fall in her bathroom. The patient fell onto her right buttock and was having some pain. The patient did not hit her head but she did receive a CT scan of her head and cervical spine which were negative. We also check some blood work as the patient's husband stated she was a little short of breath and diaphoretic. The patient's blood work is unremarkable. The patient also had a pelvis x-ray since she fell on her buttocks. There was no pelvic fracture. The patient be discharged home to follow-up with her primary care physician.  Clinical Course as of Jul 21 641  Wed Jul 21, 2016  0314 1. No evidence of traumatic intracranial injury or fracture. 2. No evidence of fracture or subluxation along the cervical spine. 3. Mild to moderate cortical volume loss and scattered small  vessel ischemic microangiopathy. 4. Chronic ischemic change at the basal ganglia bilaterally. 5. Mild diffuse degenerative change along the cervical spine. 6. Scarring and calcification at the lung apices. 7. Calcification at the carotid bifurcations bilaterally. Carotid ultrasound would be helpful for further evaluation, when and as deemed clinically appropriate.   CT Head Wo Contrast [AW]  0507 No acute pelvic fracture. DG Pelvis 1-2 Views [AW]    Clinical Course User Index [AW] Rebecka Apley, MD     ____________________________________________   FINAL CLINICAL IMPRESSION(S) / ED DIAGNOSES  Final diagnoses:  Fall, initial encounter  Musculoskeletal pain      NEW MEDICATIONS STARTED DURING THIS VISIT:  Discharge Medication List as of 07/21/2016  5:18 AM       Note:  This document was prepared using Dragon voice recognition software and may include unintentional dictation errors.    Rebecka Apley, MD 07/21/16 [phone]

## 2016-07-22 ENCOUNTER — Ambulatory Visit
Admission: RE | Admit: 2016-07-22 | Discharge: 2016-07-22 | Disposition: A | Discharge status: Home / Self Care | Payer: Medicare HMO | Source: Ambulatory Visit | Attending: Neurology | Admitting: Neurology

## 2016-07-22 DIAGNOSIS — R410 Disorientation, unspecified: Secondary | ICD-10-CM | POA: Diagnosis not present

## 2016-07-22 DIAGNOSIS — R479 Unspecified speech disturbances: Secondary | ICD-10-CM | POA: Insufficient documentation

## 2016-07-22 DIAGNOSIS — R51 Headache: Secondary | ICD-10-CM | POA: Diagnosis not present

## 2016-07-26 DIAGNOSIS — W19XXXA Unspecified fall, initial encounter: Secondary | ICD-10-CM | POA: Diagnosis not present

## 2016-07-26 DIAGNOSIS — R69 Illness, unspecified: Secondary | ICD-10-CM | POA: Diagnosis not present

## 2016-07-26 DIAGNOSIS — E785 Hyperlipidemia, unspecified: Secondary | ICD-10-CM | POA: Diagnosis not present

## 2016-07-26 DIAGNOSIS — G243 Spasmodic torticollis: Secondary | ICD-10-CM | POA: Diagnosis not present

## 2016-07-27 DIAGNOSIS — R410 Disorientation, unspecified: Secondary | ICD-10-CM | POA: Diagnosis not present

## 2016-07-29 DIAGNOSIS — G243 Spasmodic torticollis: Secondary | ICD-10-CM | POA: Diagnosis not present

## 2016-08-23 DIAGNOSIS — O3440 Maternal care for other abnormalities of cervix, unspecified trimester: Secondary | ICD-10-CM | POA: Diagnosis not present

## 2016-08-23 DIAGNOSIS — R5382 Chronic fatigue, unspecified: Secondary | ICD-10-CM | POA: Diagnosis not present

## 2016-08-23 DIAGNOSIS — M797 Fibromyalgia: Secondary | ICD-10-CM | POA: Diagnosis not present

## 2016-08-23 DIAGNOSIS — R69 Illness, unspecified: Secondary | ICD-10-CM | POA: Diagnosis not present

## 2016-09-14 DIAGNOSIS — R410 Disorientation, unspecified: Secondary | ICD-10-CM | POA: Diagnosis not present

## 2016-09-14 DIAGNOSIS — R4189 Other symptoms and signs involving cognitive functions and awareness: Secondary | ICD-10-CM | POA: Diagnosis not present

## 2016-09-14 DIAGNOSIS — G243 Spasmodic torticollis: Secondary | ICD-10-CM | POA: Diagnosis not present

## 2016-10-14 DIAGNOSIS — M1731 Unilateral post-traumatic osteoarthritis, right knee: Secondary | ICD-10-CM | POA: Diagnosis not present

## 2016-10-19 DIAGNOSIS — H2513 Age-related nuclear cataract, bilateral: Secondary | ICD-10-CM | POA: Diagnosis not present

## 2016-10-21 DIAGNOSIS — M1731 Unilateral post-traumatic osteoarthritis, right knee: Secondary | ICD-10-CM | POA: Diagnosis not present

## 2016-10-28 DIAGNOSIS — M1731 Unilateral post-traumatic osteoarthritis, right knee: Secondary | ICD-10-CM | POA: Diagnosis not present

## 2016-12-06 DIAGNOSIS — R69 Illness, unspecified: Secondary | ICD-10-CM | POA: Diagnosis not present

## 2016-12-06 DIAGNOSIS — G243 Spasmodic torticollis: Secondary | ICD-10-CM | POA: Diagnosis not present

## 2016-12-06 DIAGNOSIS — W19XXXA Unspecified fall, initial encounter: Secondary | ICD-10-CM | POA: Diagnosis not present

## 2016-12-06 DIAGNOSIS — E785 Hyperlipidemia, unspecified: Secondary | ICD-10-CM | POA: Diagnosis not present

## 2017-01-03 DIAGNOSIS — G243 Spasmodic torticollis: Secondary | ICD-10-CM | POA: Diagnosis not present

## 2017-01-14 DIAGNOSIS — L570 Actinic keratosis: Secondary | ICD-10-CM | POA: Diagnosis not present

## 2017-01-14 DIAGNOSIS — L820 Inflamed seborrheic keratosis: Secondary | ICD-10-CM | POA: Diagnosis not present

## 2017-01-14 DIAGNOSIS — L821 Other seborrheic keratosis: Secondary | ICD-10-CM | POA: Diagnosis not present

## 2017-01-14 DIAGNOSIS — L538 Other specified erythematous conditions: Secondary | ICD-10-CM | POA: Diagnosis not present

## 2017-01-14 DIAGNOSIS — X32XXXA Exposure to sunlight, initial encounter: Secondary | ICD-10-CM | POA: Diagnosis not present

## 2017-01-25 DIAGNOSIS — R0609 Other forms of dyspnea: Secondary | ICD-10-CM | POA: Diagnosis not present

## 2017-01-25 DIAGNOSIS — O3440 Maternal care for other abnormalities of cervix, unspecified trimester: Secondary | ICD-10-CM | POA: Diagnosis not present

## 2017-01-25 DIAGNOSIS — R69 Illness, unspecified: Secondary | ICD-10-CM | POA: Diagnosis not present

## 2017-01-25 DIAGNOSIS — E785 Hyperlipidemia, unspecified: Secondary | ICD-10-CM | POA: Diagnosis not present

## 2017-01-26 ENCOUNTER — Ambulatory Visit (INDEPENDENT_AMBULATORY_CARE_PROVIDER_SITE_OTHER): Payer: Medicare HMO | Admitting: Podiatry

## 2017-01-26 ENCOUNTER — Other Ambulatory Visit: Payer: Self-pay | Admitting: Podiatry

## 2017-01-26 ENCOUNTER — Ambulatory Visit (INDEPENDENT_AMBULATORY_CARE_PROVIDER_SITE_OTHER): Payer: Medicare HMO

## 2017-01-26 ENCOUNTER — Encounter: Payer: Self-pay | Admitting: Podiatry

## 2017-01-26 DIAGNOSIS — M25572 Pain in left ankle and joints of left foot: Secondary | ICD-10-CM

## 2017-01-26 DIAGNOSIS — M7752 Other enthesopathy of left foot: Secondary | ICD-10-CM

## 2017-01-26 DIAGNOSIS — M778 Other enthesopathies, not elsewhere classified: Secondary | ICD-10-CM

## 2017-01-26 DIAGNOSIS — M19072 Primary osteoarthritis, left ankle and foot: Secondary | ICD-10-CM

## 2017-01-26 DIAGNOSIS — M779 Enthesopathy, unspecified: Secondary | ICD-10-CM
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
## 2017-01-27 DIAGNOSIS — R5381 Other malaise: Secondary | ICD-10-CM | POA: Diagnosis not present

## 2017-01-27 DIAGNOSIS — E784 Other hyperlipidemia: Secondary | ICD-10-CM | POA: Diagnosis not present

## 2017-01-27 DIAGNOSIS — I10 Essential (primary) hypertension: Secondary | ICD-10-CM | POA: Diagnosis not present

## 2017-02-09 ENCOUNTER — Other Ambulatory Visit: Payer: Medicare HMO | Admitting: Orthotics

## 2017-02-10 DIAGNOSIS — W19XXXA Unspecified fall, initial encounter: Secondary | ICD-10-CM | POA: Diagnosis not present

## 2017-02-10 DIAGNOSIS — E785 Hyperlipidemia, unspecified: Secondary | ICD-10-CM | POA: Diagnosis not present

## 2017-02-10 DIAGNOSIS — R69 Illness, unspecified: Secondary | ICD-10-CM | POA: Diagnosis not present

## 2017-02-10 DIAGNOSIS — R0609 Other forms of dyspnea: Secondary | ICD-10-CM | POA: Diagnosis not present

## 2017-02-10 DIAGNOSIS — G243 Spasmodic torticollis: Secondary | ICD-10-CM | POA: Diagnosis not present

## 2017-02-16 DIAGNOSIS — G243 Spasmodic torticollis: Secondary | ICD-10-CM | POA: Diagnosis not present

## 2017-02-16 DIAGNOSIS — E785 Hyperlipidemia, unspecified: Secondary | ICD-10-CM | POA: Diagnosis not present

## 2017-02-16 DIAGNOSIS — R69 Illness, unspecified: Secondary | ICD-10-CM | POA: Diagnosis not present

## 2017-02-16 DIAGNOSIS — W19XXXA Unspecified fall, initial encounter: Secondary | ICD-10-CM | POA: Diagnosis not present

## 2017-02-23 ENCOUNTER — Other Ambulatory Visit: Payer: Medicare HMO | Admitting: Orthotics

## 2017-03-01 DIAGNOSIS — R69 Illness, unspecified: Secondary | ICD-10-CM | POA: Diagnosis not present

## 2017-03-01 DIAGNOSIS — E785 Hyperlipidemia, unspecified: Secondary | ICD-10-CM | POA: Diagnosis not present

## 2017-03-01 DIAGNOSIS — H832X9 Labyrinthine dysfunction, unspecified ear: Secondary | ICD-10-CM | POA: Diagnosis not present

## 2017-03-01 DIAGNOSIS — G459 Transient cerebral ischemic attack, unspecified: Secondary | ICD-10-CM | POA: Diagnosis not present

## 2017-03-04 ENCOUNTER — Ambulatory Visit: Payer: Medicare HMO | Admitting: Gastroenterology

## 2017-03-09 ENCOUNTER — Ambulatory Visit: Payer: Medicare HMO | Admitting: Gastroenterology

## 2017-03-15 ENCOUNTER — Other Ambulatory Visit: Payer: Self-pay | Admitting: Internal Medicine

## 2017-03-15 DIAGNOSIS — R42 Dizziness and giddiness: Secondary | ICD-10-CM | POA: Diagnosis not present

## 2017-03-15 DIAGNOSIS — R1312 Dysphagia, oropharyngeal phase: Secondary | ICD-10-CM | POA: Diagnosis not present

## 2017-03-15 DIAGNOSIS — R1310 Dysphagia, unspecified: Secondary | ICD-10-CM

## 2017-03-15 DIAGNOSIS — R110 Nausea: Secondary | ICD-10-CM | POA: Diagnosis not present

## 2017-03-15 DIAGNOSIS — K219 Gastro-esophageal reflux disease without esophagitis: Secondary | ICD-10-CM | POA: Diagnosis not present

## 2017-03-21 ENCOUNTER — Ambulatory Visit
Admission: RE | Admit: 2017-03-21 | Discharge: 2017-03-21 | Disposition: A | Discharge status: Home / Self Care | Payer: Medicare HMO | Source: Ambulatory Visit | Attending: Internal Medicine | Admitting: Internal Medicine

## 2017-03-21 DIAGNOSIS — R1310 Dysphagia, unspecified: Secondary | ICD-10-CM

## 2017-03-21 DIAGNOSIS — R110 Nausea: Secondary | ICD-10-CM | POA: Diagnosis present

## 2017-03-21 DIAGNOSIS — K449 Diaphragmatic hernia without obstruction or gangrene: Secondary | ICD-10-CM | POA: Diagnosis not present

## 2017-03-21 DIAGNOSIS — K219 Gastro-esophageal reflux disease without esophagitis: Secondary | ICD-10-CM

## 2017-03-21 DIAGNOSIS — R1312 Dysphagia, oropharyngeal phase: Secondary | ICD-10-CM | POA: Diagnosis present

## 2017-04-13 ENCOUNTER — Encounter: Payer: Self-pay | Admitting: Urology

## 2017-04-13 ENCOUNTER — Ambulatory Visit: Payer: Medicare HMO | Admitting: Urology

## 2017-04-13 VITALS — BP 126/75 | HR 92 | Ht 63.0 in | Wt 120.8 lb

## 2017-04-13 DIAGNOSIS — N393 Stress incontinence (female) (male): Secondary | ICD-10-CM

## 2017-04-13 DIAGNOSIS — R32 Unspecified urinary incontinence: Secondary | ICD-10-CM

## 2017-04-13 DIAGNOSIS — R3129 Other microscopic hematuria: Secondary | ICD-10-CM

## 2017-04-13 LAB — MICROSCOPIC EXAMINATION: RBC MICROSCOPIC, UA: NONE SEEN /HPF (ref 0–?)

## 2017-04-13 LAB — URINALYSIS, COMPLETE
BILIRUBIN UA: NEGATIVE
GLUCOSE, UA: NEGATIVE
NITRITE UA: NEGATIVE
Protein, UA: NEGATIVE
SPEC GRAV UA: 1.02 (ref 1.005–1.030)
UUROB: 0.2 mg/dL (ref 0.2–1.0)
pH, UA: 5.5 (ref 5.0–7.5)

## 2017-04-13 LAB — BLADDER SCAN AMB NON-IMAGING: SCAN RESULT: 0

## 2017-04-13 NOTE — Progress Notes (Signed)
04/13/2017 2:07 PM   Gabriella Rogers 08-07-1933 [id number]  Referring provider: Corky Downs, MD [address] [place], [place] [address]  Chief Complaint  Patient presents with  . Urinary Incontinence    HPI: 83 year old female who returns today for follow-up of mixed urinary incontinence.  Patient has been on Mybetriq 50 mg daily which she reported last year was significantly beneficial and giving her more time to get to the bathroom.  Today, she is unsure whether it is helping or not anymore.  Again, she is able to sit in her lounge chair at home for several hours without voiding.  Immediately when she stands up, she does not have time to get to the bathroom.  She previously contributed this as forgetting to get up and go to the bathroom.  She wears 2-3 ppd whic hare not always saturated.     She gets up once at night to void.    She drinks 2 cups of coffee each morning and several sodas/ tea daily in addition to this.    She continues to have mild stress incontinence which is not particularly bothersome to her.  Previous questionable microscopic hematuria which was not demonstrated on catheterized UA.  UA today again is unremarkable, no evidence of microscopic blood.  PVR 0.    She does have continued issues with short-term memory loss which seems to be progressing.  Although she provides the above history, she is somewhat uncertain about various questions today.   PMH: Past Medical History:  Diagnosis Date  . Brain disorder 03/27/2014  . Cervical dystonia 01/24/2012  . Clinical depression 11/26/2015  . Dizziness 11/26/2015   Overview:  Chronic, felt secondary to inner ear per ENT evaluation approximately 1996.   . H/O total knee replacement 11/23/2015  . Headache, migraine 11/26/2015  . HLD (hyperlipidemia) 11/26/2015  . Hypercholesteremia   . Osteoporosis, post-menopausal 11/26/2015    Surgical History: Past Surgical History:  Procedure Laterality Date  . ABDOMINAL  HYSTERECTOMY    . ANKLE SURGERY Left   . KNEE SURGERY Right     Home Medications:  Allergies as of 04/13/2017      Reactions   Actonel  [risedronate Sodium]    Piper    Pneumococcal Vaccine Other (See Comments)   WEAKNESS, DISORIENTED, "ARM SORE, RED, HURTING"   Risedronate Other (See Comments)   REDNESS IN FACE   Alendronate Rash   Aleve [naproxen Sodium] Rash   Pravastatin Rash      Medication List       Accurate as of 04/13/17  2:07 PM. Always use your most recent med list.          aspirin 81 MG tablet Take by mouth.   CALTRATE 600+D3 SOFT 600-800 MG-UNIT Chew Generic drug:  Calcium Carb-Cholecalciferol Chew by mouth.   clonazePAM 0.5 MG tablet Commonly known as:  KLONOPIN Take 0.5 mg by mouth 2 (two) times daily as needed.   donepezil 10 MG tablet Commonly known as:  ARICEPT Take by mouth.   mirabegron ER 50 MG Tb24 tablet Commonly known as:  MYRBETRIQ Take 1 tablet (50 mg total) by mouth daily.   PAXIL 10 MG tablet Generic drug:  PARoxetine Take by mouth.       Allergies:  Allergies  Allergen Reactions  . Actonel  [Risedronate Sodium]   . Piper   . Pneumococcal Vaccine Other (See Comments)    WEAKNESS, DISORIENTED, "ARM SORE, RED, HURTING"  . Risedronate Other (See Comments)  REDNESS IN FACE  . Alendronate Rash  . Aleve [Naproxen Sodium] Rash  . Pravastatin Rash    Family History: Family History  Problem Relation Age of Onset  . Kidney cancer Neg Hx   . Hematuria Neg Hx     Social History:  reports that she has never smoked. She has never used smokeless tobacco. She reports that she does not drink alcohol. Her drug history is not on file.  ROS: UROLOGY Frequent Urination?: No Hard to postpone urination?: Yes Burning/pain with urination?: No Get up at night to urinate?: Yes Leakage of urine?: Yes Urine stream starts and stops?: No Trouble starting stream?: No Do you have to strain to urinate?: No Blood in urine?: No Urinary  tract infection?: No Sexually transmitted disease?: No Injury to kidneys or bladder?: No Painful intercourse?: No Weak stream?: No Currently pregnant?: No Vaginal bleeding?: No Last menstrual period?: n  Gastrointestinal Nausea?: No Vomiting?: No Indigestion/heartburn?: No Diarrhea?: No Constipation?: No  Constitutional Fever: No Night sweats?: No Weight loss?: No Fatigue?: Yes  Skin Skin rash/lesions?: No Itching?: No  Eyes Blurred vision?: Yes Double vision?: No  Ears/Nose/Throat Sore throat?: No Sinus problems?: Yes  Hematologic/Lymphatic Swollen glands?: No Easy bruising?: Yes  Cardiovascular Leg swelling?: No Chest pain?: No  Respiratory Cough?: No Shortness of breath?: Yes  Endocrine Excessive thirst?: No  Musculoskeletal Back pain?: No Joint pain?: Yes  Neurological Headaches?: No Dizziness?: Yes  Psychologic Depression?: No Anxiety?: Yes  Physical Exam: BP 126/75 (BP Location: Right Arm, Patient Position: Sitting, Cuff Size: Normal)   Pulse 92   Ht 5\' 3"  (1.6 m)   Wt 120 lb 12.8 oz (54.8 kg)   BMI 21.40 kg/m   Constitutional:  Alert and oriented, No acute distress. HEENT: [place] AT, moist mucus membranes.  Trachea midline, no masses. Cardiovascular: No clubbing, cyanosis, or edema. Respiratory: Normal respiratory effort, no increased work of breathing. Skin: No rashes, bruises or suspicious lesions. Neurologic: Grossly intact, no focal deficits, moving all 4 extremities. Psychiatric: Normal mood and affect.  Laboratory Data: Lab Results  Component Value Date   WBC 9.8 07/20/2016   HGB 12.2 07/20/2016   HCT 34.6 (L) 07/20/2016   MCV 99.7 07/20/2016   PLT 337 07/20/2016    Lab Results  Component Value Date   CREATININE 0.98 07/20/2016   Urinalysis Results for orders placed or performed in visit on 04/13/17  BLADDER SCAN AMB NON-IMAGING  Result Value Ref Range   Scan Result 0     Pertinent Imaging: PVR as  above  Assessment & Plan:    1. Urge incontinence Continue Mybetriq 50 mg -advised to take a short break from this medication, approximately 1 month in time to assess whether or not she believes the medication is truly helping her or not And continue to believe that there is a large behavioral component, forgetting to get up and go to the bathroom Continue to encourage timed voiding, advised to set an egg timer to void at least every 2 hours during the day as she currently only goes 3-4 times a day with episodes of urge incontinence Behavioral modification discussed with avoidance of irritating beverages  2. Stress incontinence, female Continue Kegel exercises as previously discussed  3. Microscopic hematuria No evidence of microscopic hematuria today on catheterized specimen  Return if symptoms worsen or fail to improve.  Vanna ScotlandAshley Samanthajo Payano, MD  [facility] [address], [address] [place], [place] [address] [phone]

## 2017-04-19 IMAGING — CR DG HAND COMPLETE 3+V*L*
1 series · 3 of 3 positions shown · non-contrast
Comparison: None.

CLINICAL DATA: 81-year-old female with acute left hand pain
following fall 2 days ago. Initial encounter.

EXAM:
LEFT HAND - COMPLETE 3+ VIEW

[Series 1: pa · 0.17mm/px · 3 of 3 slices shown]
[im 1/3]
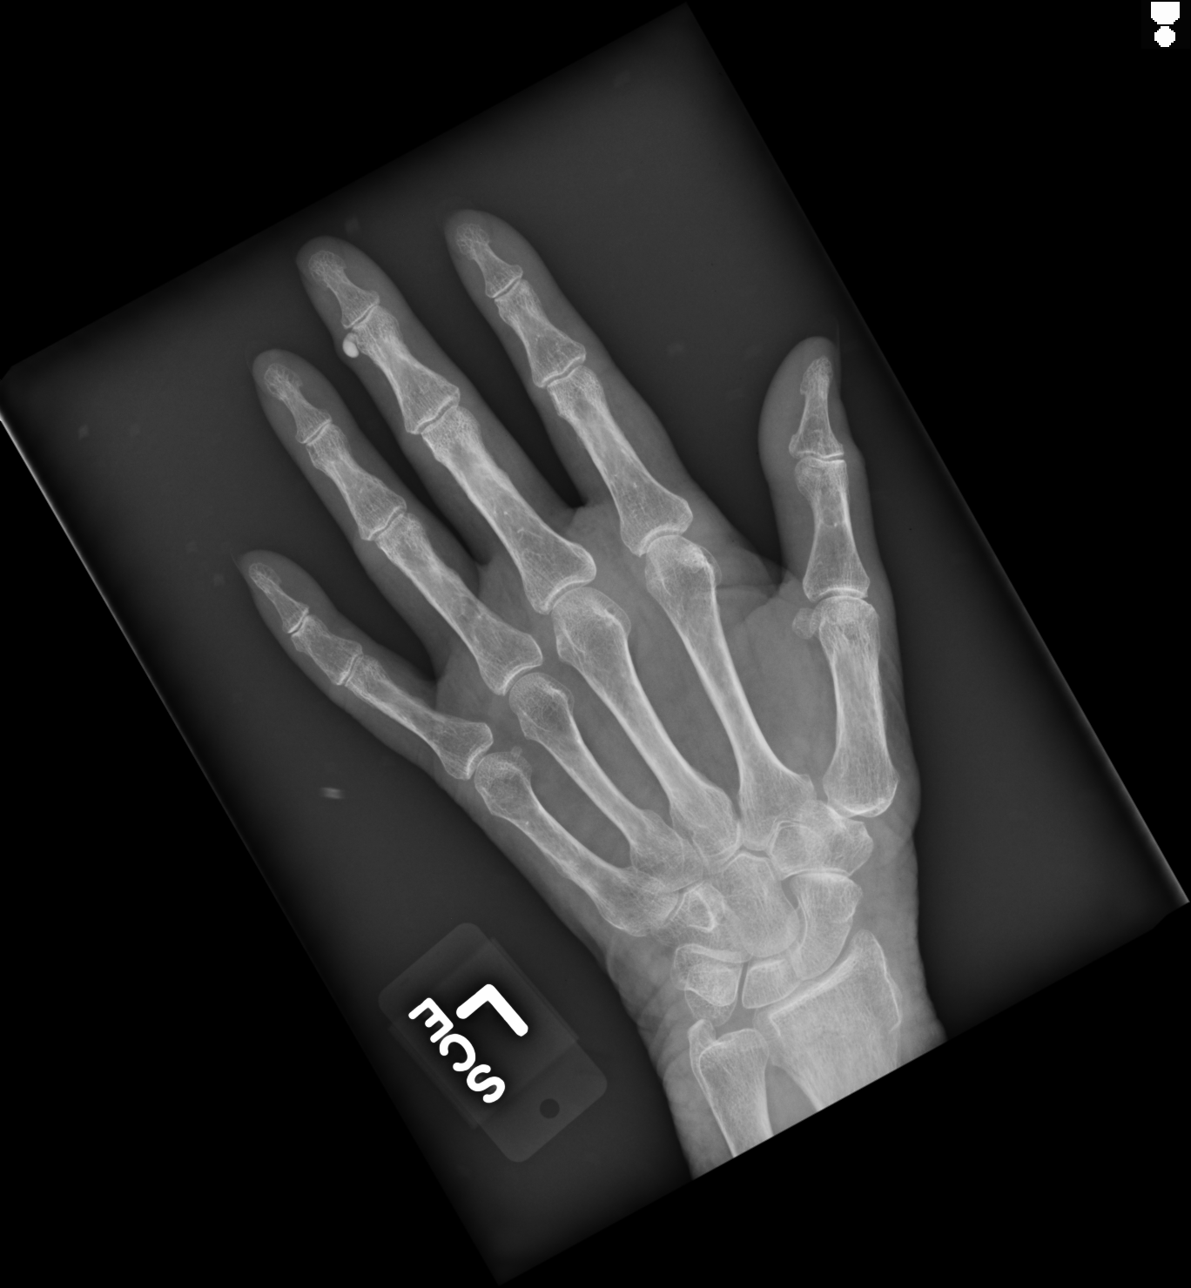
[im 2/3]
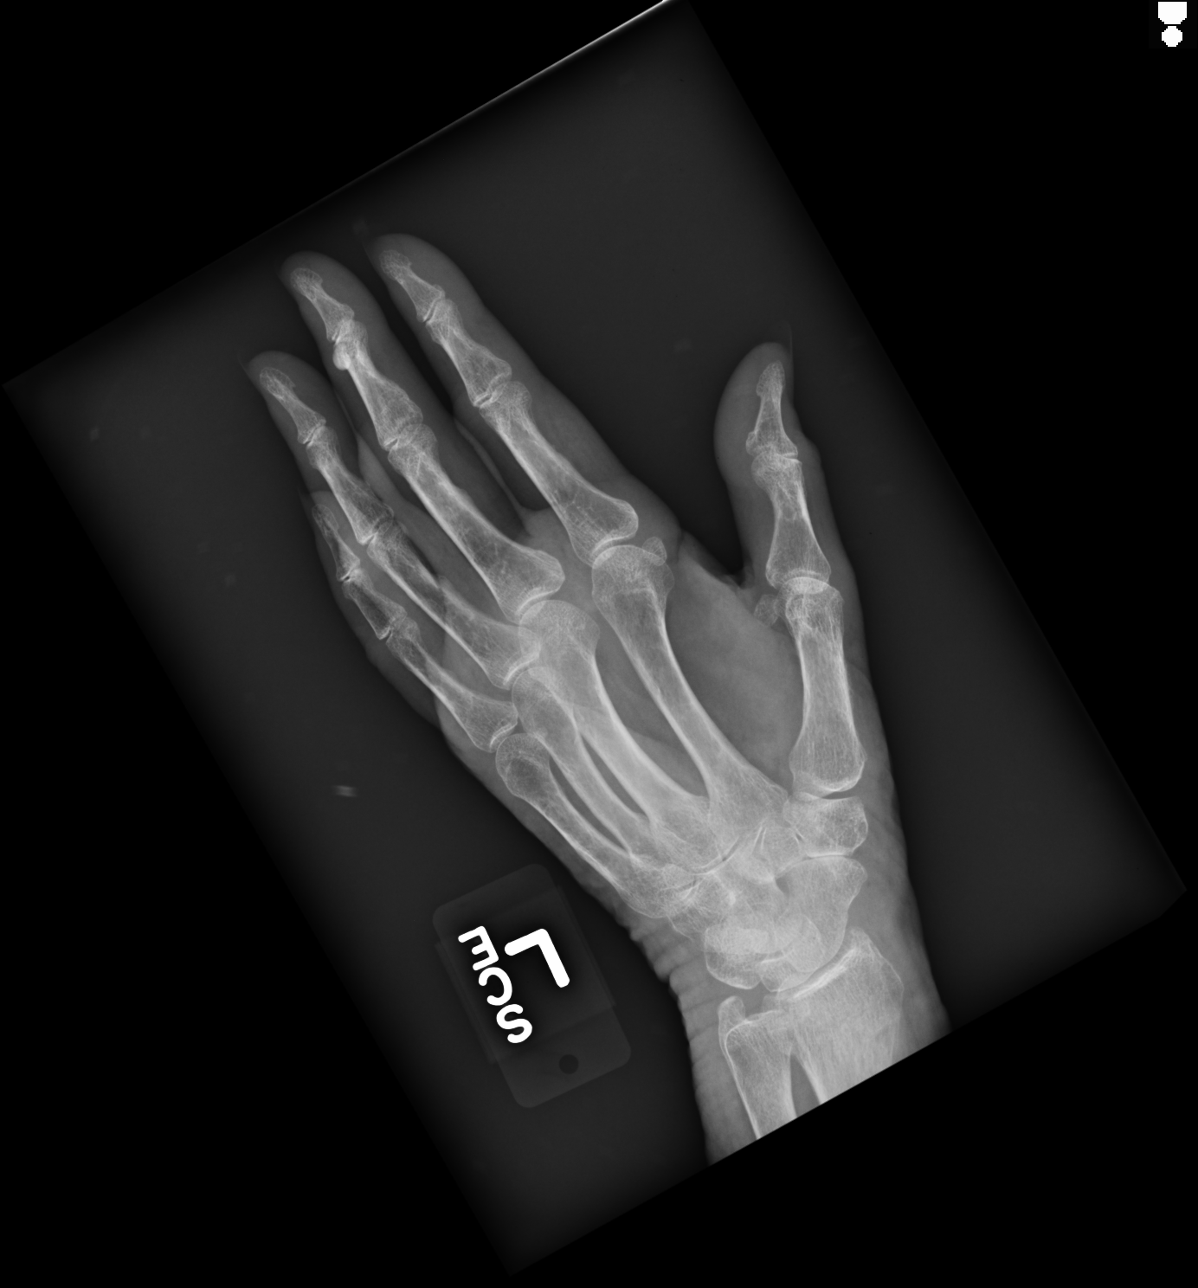
[im 3/3]
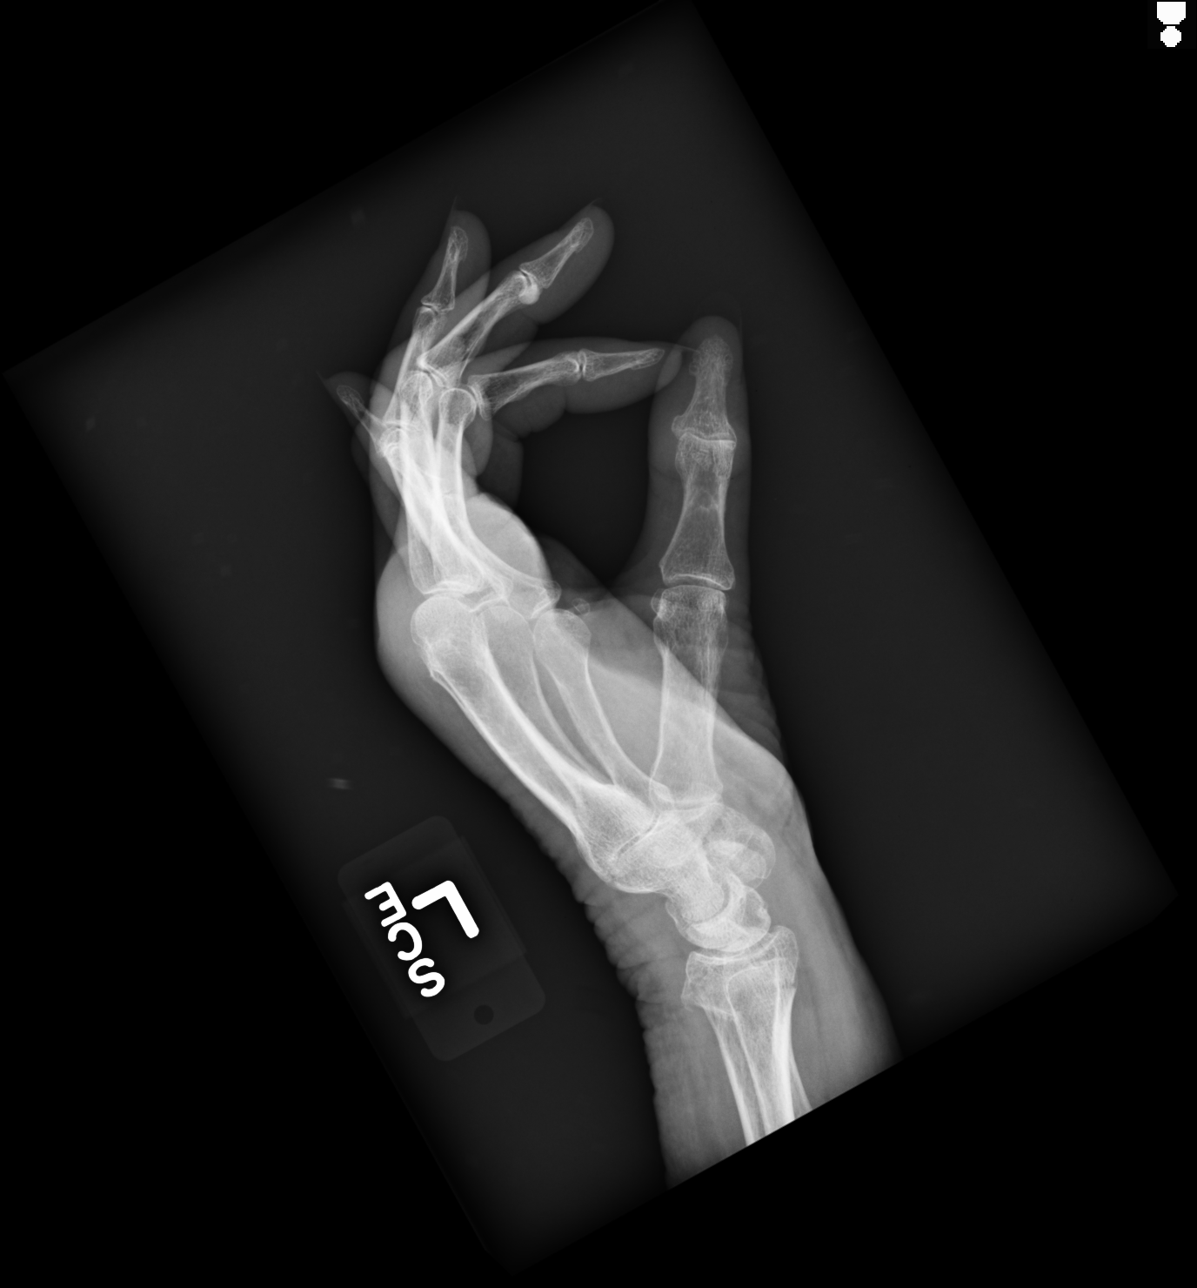

[3 of 3 positions shown; findings below may reference images not displayed]

FINDINGS: A nondisplaced distal radial fracture is noted.

No other fracture, subluxation or dislocation identified.

Dorsal soft tissue swelling of the hand identified.

Dorsal tilt of the lunate is identified.
IMPRESSION: Nondisplaced distal radial fracture which is better evaluated on
wrist films performed today.

Dorsal hand soft tissue swelling without other acute bony
abnormality.

## 2017-05-31 ENCOUNTER — Encounter: Payer: Self-pay | Admitting: Urology

## 2017-05-31 ENCOUNTER — Ambulatory Visit (INDEPENDENT_AMBULATORY_CARE_PROVIDER_SITE_OTHER): Payer: Medicare HMO | Admitting: Urology

## 2017-05-31 VITALS — BP 113/66 | HR 101 | Ht 62.0 in | Wt 122.0 lb

## 2017-05-31 DIAGNOSIS — N362 Urethral caruncle: Secondary | ICD-10-CM

## 2017-05-31 DIAGNOSIS — N952 Postmenopausal atrophic vaginitis: Secondary | ICD-10-CM

## 2017-05-31 DIAGNOSIS — R32 Unspecified urinary incontinence: Secondary | ICD-10-CM | POA: Diagnosis not present

## 2017-05-31 LAB — MICROSCOPIC EXAMINATION
Bacteria, UA: NONE SEEN
RBC, UA: NONE SEEN /hpf (ref 0–?)

## 2017-05-31 LAB — URINALYSIS, COMPLETE
BILIRUBIN UA: NEGATIVE
Glucose, UA: NEGATIVE
Leukocytes, UA: NEGATIVE
Nitrite, UA: NEGATIVE
PH UA: 5 (ref 5.0–7.5)
Protein, UA: NEGATIVE
Specific Gravity, UA: >1.03 — ABNORMAL HIGH (ref 1.005–1.030)
UUROB: 0.2 mg/dL (ref 0.2–1.0)

## 2017-05-31 MED ORDER — ESTRADIOL 0.1 MG/GM VA CREA: 1.0000 g | TOPICAL_CREAM | VAGINAL | 12 refills | Status: DC

## 2017-05-31 NOTE — Progress Notes (Signed)
05/31/2017 1:21 PM   Gabriella Rogers [birth year] [id number]  Referring provider: Corky Downs, MD [address] [place], [place] [address]  Chief Complaint  Patient presents with  . Urinary Incontinence    HPI: 83 year old female who returns today for follow-up of mixed urinary incontinence.  Interval history: She continues to have limited bladder control and returns today to discuss this further.  Since her last visit, she has implemented no changes including has not implemented timed and double voiding is not tried stopping and resuming the Myrbetriq as discussed.  She continues to have memory issues and reports that she forgot about our discussion until reminded today.  She continues to primarily be bothered by urgency and urge incontinence.  As per our previous discussions, she is most bothered in the morning in the daytime.  This happens primarily when she is been sitting in her chair for several hours at a time and forgets to use the bathroom.  When she stands up, she will have an episode of gross incontinence which is very bothersome and worrisome to her.  He has minimal nighttime symptoms.  She also reports today that she has labial and vaginal irritation which is bothersome to her.  This is been going on for quite some time but seems to be getting worse.  She describes this as burning which is external.  Previous history: Currently on Mybetriq 50 mg daily  She wears 2-3 ppd which hare not always saturated.     She gets up once at night to void.    She drinks 2 cups of coffee each morning and several sodas/ tea daily in addition to this.    She continues to have mild stress incontinence which is not particularly bothersome to her.  Previous questionable microscopic hematuria which was not demonstrated on catheterized UA.    PVR 0 in the past..    She does have continued issues with short-term memory loss which seems to be progressing.  Although she provides the above  history, she is somewhat uncertain about various questions today.   PMH: Past Medical History:  Diagnosis Date  . Brain disorder 03/27/2014  . Cervical dystonia 01/24/2012  . Clinical depression 11/26/2015  . Dizziness 11/26/2015   Overview:  Chronic, felt secondary to inner ear per ENT evaluation approximately 1996.   . H/O total knee replacement 11/23/2015  . Headache, migraine 11/26/2015  . HLD (hyperlipidemia) 11/26/2015  . Hypercholesteremia   . Osteoporosis, post-menopausal 11/26/2015    Surgical History: Past Surgical History:  Procedure Laterality Date  . ABDOMINAL HYSTERECTOMY    . ANKLE SURGERY Left   . KNEE SURGERY Right     Home Medications:  Allergies as of 05/31/2017      Reactions   Actonel  [risedronate Sodium]    Piper    Pneumococcal Vaccine Other (See Comments)   WEAKNESS, DISORIENTED, "ARM SORE, RED, HURTING"   Risedronate Other (See Comments)   REDNESS IN FACE   Alendronate Rash   Aleve [naproxen Sodium] Rash   Pravastatin Rash      Medication List        Accurate as of 05/31/17  1:21 PM. Always use your most recent med list.          aspirin 81 MG tablet Take by mouth.   CALTRATE 600+D3 SOFT 600-800 MG-UNIT Chew Generic drug:  Calcium Carb-Cholecalciferol Chew by mouth.   clonazePAM 0.5 MG tablet Commonly known as:  KLONOPIN Take 0.5 mg by mouth 2 (two) times  daily as needed.   donepezil 10 MG tablet Commonly known as:  ARICEPT Take by mouth.   estradiol 0.1 MG/GM vaginal cream Commonly known as:  ESTRACE Place 1 g vaginally 3 (three) times a week. Pea sized amount per urethra and vulva Start taking on:  06/01/2017   mirabegron ER 50 MG Tb24 tablet Commonly known as:  MYRBETRIQ Take 1 tablet (50 mg total) by mouth daily.   PAXIL 10 MG tablet Generic drug:  PARoxetine Take by mouth.       Allergies:  Allergies  Allergen Reactions  . Actonel  [Risedronate Sodium]   . Piper   . Pneumococcal Vaccine Other (See Comments)     WEAKNESS, DISORIENTED, "ARM SORE, RED, HURTING"  . Risedronate Other (See Comments)    REDNESS IN FACE  . Alendronate Rash  . Aleve [Naproxen Sodium] Rash  . Pravastatin Rash    Family History: Family History  Problem Relation Age of Onset  . Kidney cancer Neg Hx   . Hematuria Neg Hx     Social History:  reports that  has never smoked. she has never used smokeless tobacco. She reports that she does not drink alcohol. Her drug history is not on file.  ROS: UROLOGY Frequent Urination?: Yes Hard to postpone urination?: Yes Burning/pain with urination?: No Get up at night to urinate?: No Leakage of urine?: Yes Urine stream starts and stops?: No Trouble starting stream?: No Do you have to strain to urinate?: No Blood in urine?: No Urinary tract infection?: No Sexually transmitted disease?: No Injury to kidneys or bladder?: No Painful intercourse?: No Weak stream?: No Currently pregnant?: No Vaginal bleeding?: No Last menstrual period?: n   Gastrointestinal Nausea?: No Vomiting?: No Indigestion/heartburn?: No Diarrhea?: No Constipation?: Yes  Constitutional Fever: No Night sweats?: Yes Weight loss?: No Fatigue?: Yes  Skin Skin rash/lesions?: No Itching?: No  Eyes Blurred vision?: Yes Double vision?: No  Ears/Nose/Throat Sore throat?: No Sinus problems?: Yes  Hematologic/Lymphatic Swollen glands?: No Easy bruising?: Yes  Cardiovascular Leg swelling?: Yes Chest pain?: No  Respiratory Cough?: No Shortness of breath?: No  Endocrine Excessive thirst?: No  Musculoskeletal Back pain?: No Joint pain?: Yes  Neurological Headaches?: No Dizziness?: Yes  Psychologic Depression?: No Anxiety?: Yes  Physical Exam: BP 113/66   Pulse (!) 101   Ht 5\' 2"  (1.575 m)   Wt 122 lb (55.3 kg)   BMI 22.31 kg/m   Constitutional:  Alert and oriented, No acute distress. HEENT:  AT, moist mucus membranes.  Trachea midline, no masses. Cardiovascular: No  clubbing, cyanosis, or edema. Respiratory: Normal respiratory effort, no increased work of breathing. Skin: No rashes, bruises or suspicious lesions Pelvic exam: Chaperoned by Eligha BridegroomSarah Watts, MA.  Normal external genitalia with severe atrophic vaginitis involving introitus and labia minora.  She also has a urethral caruncle at the 6 o'clock position.  Excellent vaginal support with Valsalva.  Urethral hypermobility appreciated without stress incontinence. Neurologic: Grossly intact, no focal deficits, moving all 4 extremities. Psychiatric: Normal mood and affect.  Laboratory Data: Lab Results  Component Value Date   WBC 9.8 07/20/2016   HGB 12.2 07/20/2016   HCT 34.6 (L) 07/20/2016   MCV 99.7 07/20/2016   PLT 337 07/20/2016    Lab Results  Component Value Date   CREATININE 0.98 07/20/2016   Urinalysis UA today without evidence of microscopic hematuria or infection, see epic for details  Pertinent Imaging: n/a  Assessment & Plan:    1. Urge incontinence Continue Mybetriq 50  mg  Given her memory and attention issues, I do not feel that she is a good candidate for anticholinergic medication Continue to believe that primarily her symptoms are behavioral, prolonged sitting with symptoms after several hours when she rises Continue to encourage timed voiding, or just an egg timer and void every 1-1/2-2 hours Discussed options for refractory urge incontinence including Botox and posterior nerve stimulation She is given information on posterior nerve stimulation today and call us if she like to proceed with this next line would prefer to avoid Botox but as I do not feel she would be a great candidate for self cath that should she develop urinary retention  2. Stress incontinence, female Continue Kegel exercises as previously discussed  3. Microscopic hematuria No evidence of microscopic hematuria on catheterized specimen  3. Urethral caruncle Incidental May resolve with treatment of  #4  4. Atrophic vaginitis Symptomatic atrophic vaginitis, recommend topical estrogen cream Discussed application instructions including using a pea-sized amount per urethra/vagina 3 times a week No contraindications for this medication  F/u prn if she would like to pursue PTNS  Vanna ScotlandAshley Shyana Kulakowski, MD  [facility] [address], [address] [place], [place] [address] [phone]  I spent 25 min with this patient of which greater than 50% was spent in counseling and coordination of care with the patient.

## 2017-06-02 DIAGNOSIS — G243 Spasmodic torticollis: Secondary | ICD-10-CM | POA: Diagnosis not present

## 2017-07-25 DIAGNOSIS — Y92009 Unspecified place in unspecified non-institutional (private) residence as the place of occurrence of the external cause: Secondary | ICD-10-CM | POA: Diagnosis not present

## 2017-07-25 DIAGNOSIS — R69 Illness, unspecified: Secondary | ICD-10-CM | POA: Diagnosis not present

## 2017-07-25 DIAGNOSIS — W19XXXA Unspecified fall, initial encounter: Secondary | ICD-10-CM | POA: Diagnosis not present

## 2017-07-25 DIAGNOSIS — E785 Hyperlipidemia, unspecified: Secondary | ICD-10-CM | POA: Diagnosis not present

## 2017-07-26 DIAGNOSIS — R5381 Other malaise: Secondary | ICD-10-CM | POA: Diagnosis not present

## 2017-07-26 DIAGNOSIS — D519 Vitamin B12 deficiency anemia, unspecified: Secondary | ICD-10-CM | POA: Diagnosis not present

## 2017-07-26 DIAGNOSIS — E7849 Other hyperlipidemia: Secondary | ICD-10-CM | POA: Diagnosis not present

## 2017-07-26 DIAGNOSIS — I10 Essential (primary) hypertension: Secondary | ICD-10-CM | POA: Diagnosis not present

## 2017-08-08 DIAGNOSIS — O3440 Maternal care for other abnormalities of cervix, unspecified trimester: Secondary | ICD-10-CM | POA: Diagnosis not present

## 2017-08-08 DIAGNOSIS — G243 Spasmodic torticollis: Secondary | ICD-10-CM | POA: Diagnosis not present

## 2017-08-08 DIAGNOSIS — E785 Hyperlipidemia, unspecified: Secondary | ICD-10-CM | POA: Diagnosis not present

## 2017-08-08 DIAGNOSIS — R69 Illness, unspecified: Secondary | ICD-10-CM | POA: Diagnosis not present

## 2017-09-23 DIAGNOSIS — M25512 Pain in left shoulder: Secondary | ICD-10-CM | POA: Diagnosis not present

## 2017-09-23 DIAGNOSIS — M7542 Impingement syndrome of left shoulder: Secondary | ICD-10-CM | POA: Diagnosis not present

## 2017-10-24 DIAGNOSIS — M6281 Muscle weakness (generalized): Secondary | ICD-10-CM | POA: Diagnosis not present

## 2017-10-24 DIAGNOSIS — R293 Abnormal posture: Secondary | ICD-10-CM | POA: Diagnosis not present

## 2017-10-24 DIAGNOSIS — M79602 Pain in left arm: Secondary | ICD-10-CM | POA: Diagnosis not present

## 2017-10-31 ENCOUNTER — Ambulatory Visit: Payer: Medicare HMO | Admitting: Urology

## 2017-10-31 ENCOUNTER — Encounter: Payer: Self-pay | Admitting: Urology

## 2017-10-31 VITALS — BP 145/75 | HR 67 | Ht 62.0 in | Wt 120.4 lb

## 2017-10-31 DIAGNOSIS — N3946 Mixed incontinence: Secondary | ICD-10-CM

## 2017-10-31 DIAGNOSIS — R32 Unspecified urinary incontinence: Secondary | ICD-10-CM

## 2017-10-31 LAB — URINALYSIS, COMPLETE
BILIRUBIN UA: NEGATIVE
GLUCOSE, UA: NEGATIVE
KETONES UA: NEGATIVE
Leukocytes, UA: NEGATIVE
NITRITE UA: NEGATIVE
RBC, UA: NEGATIVE
Specific Gravity, UA: >1.03 — ABNORMAL HIGH (ref 1.005–1.030)
UUROB: 0.2 mg/dL (ref 0.2–1.0)
pH, UA: 5.5 (ref 5.0–7.5)

## 2017-10-31 LAB — MICROSCOPIC EXAMINATION: WBC UA: NONE SEEN /HPF (ref 0–5)

## 2017-10-31 MED ORDER — FESOTERODINE FUMARATE ER 4 MG PO TB24: 4.0000 mg | ORAL_TABLET | Freq: Every day | ORAL | 11 refills | Status: DC

## 2017-10-31 NOTE — Progress Notes (Signed)
10/31/2017 2:32 PM   Gabriella Rogers 04-Jan-1934 [id number]  Referring provider: Corky Downs, MD [address] [place], [place] [address]  Chief Complaint  Patient presents with  . Urinary Incontinence    HPI: Dr Apolinar Junes: The patient has urgency incontinence..  She can leak a lot when she goes from a sitting to standing position.  She failed Myrbetriq and she has memory issues.  Dr. B was concerned about antimuscarinics because of her memory and had discussed percutaneous tibial nerve stimulation with her.   Today The patient has urge incontinence and foot on the floor syndrome.  She has mild bedwetting.  She denies stress incontinence or she does it minimal.  She wears 3 pads a day sometimes damp sometimes moderately wet.  She does admit she has some short-term memory issues.  She believes she has had 2 TIAs.  She is currently on Myrbetriq with mild benefit  Modifying factors: There are no other modifying factors  Associated signs and symptoms: There are no other associated signs and symptoms Aggravating and relieving factors: There are no other aggravating or relieving factors Severity: Moderate Duration: Persistent   PMH: Past Medical History:  Diagnosis Date  . Brain disorder 03/27/2014  . Cervical dystonia 01/24/2012  . Clinical depression 11/26/2015  . Dizziness 11/26/2015   Overview:  Chronic, felt secondary to inner ear per ENT evaluation approximately 1996.   . H/O total knee replacement 11/23/2015  . Headache, migraine 11/26/2015  . HLD (hyperlipidemia) 11/26/2015  . Hypercholesteremia   . Osteoporosis, post-menopausal 11/26/2015    Surgical History: Past Surgical History:  Procedure Laterality Date  . ABDOMINAL HYSTERECTOMY    . ANKLE SURGERY Left   . KNEE SURGERY Right     Home Medications:  Allergies as of 10/31/2017      Reactions   Actonel  [risedronate Sodium]    Piper    Pneumococcal Vaccine Other (See Comments)   WEAKNESS, DISORIENTED, "ARM SORE, RED, HURTING"    Risedronate Other (See Comments)   REDNESS IN FACE   Alendronate Rash   Aleve [naproxen Sodium] Rash   Pravastatin Rash      Medication List        Accurate as of 10/31/17  2:32 PM. Always use your most recent med list.          aspirin 81 MG tablet Take by mouth.   CALTRATE 600+D3 SOFT 600-800 MG-UNIT Chew Generic drug:  Calcium Carb-Cholecalciferol Chew by mouth.   clonazePAM 0.5 MG tablet Commonly known as:  KLONOPIN Take 0.5 mg by mouth 2 (two) times daily as needed.   donepezil 10 MG tablet Commonly known as:  ARICEPT Take by mouth.   mirabegron ER 50 MG Tb24 tablet Commonly known as:  MYRBETRIQ Take 1 tablet (50 mg total) by mouth daily.   PAXIL 10 MG tablet Generic drug:  PARoxetine Take by mouth.   rosuvastatin 5 MG tablet Commonly known as:  CRESTOR       Allergies:  Allergies  Allergen Reactions  . Actonel  [Risedronate Sodium]   . Piper   . Pneumococcal Vaccine Other (See Comments)    WEAKNESS, DISORIENTED, "ARM SORE, RED, HURTING"  . Risedronate Other (See Comments)    REDNESS IN FACE  . Alendronate Rash  . Aleve [Naproxen Sodium] Rash  . Pravastatin Rash    Family History: Family History  Problem Relation Age of Onset  . Kidney cancer Neg Hx   . Hematuria Neg Hx     Social  History:  reports that she has never smoked. She has never used smokeless tobacco. She reports that she does not drink alcohol. Her drug history is not on file.  ROS: UROLOGY Frequent Urination?: Yes Hard to postpone urination?: Yes Burning/pain with urination?: No Get up at night to urinate?: Yes Leakage of urine?: Yes Urine stream starts and stops?: No Trouble starting stream?: No Do you have to strain to urinate?: No Blood in urine?: No Urinary tract infection?: No Sexually transmitted disease?: No Injury to kidneys or bladder?: No Painful intercourse?: No Weak stream?: No Currently pregnant?: No Vaginal bleeding?: No Last menstrual period?:  n  Gastrointestinal Nausea?: No Vomiting?: No Indigestion/heartburn?: No Diarrhea?: No Constipation?: No  Constitutional Fever: No Night sweats?: No Weight loss?: No Fatigue?: Yes  Skin Skin rash/lesions?: No Itching?: No  Eyes Blurred vision?: Yes Double vision?: No  Ears/Nose/Throat Sore throat?: No Sinus problems?: Yes  Hematologic/Lymphatic Swollen glands?: No Easy bruising?: No  Cardiovascular Leg swelling?: No Chest pain?: No  Respiratory Cough?: No Shortness of breath?: Yes  Endocrine Excessive thirst?: No  Musculoskeletal Back pain?: No Joint pain?: No  Neurological Headaches?: No Dizziness?: Yes  Psychologic Depression?: No Anxiety?: Yes  Physical Exam: BP (!) 145/75 (BP Location: Right Arm, Patient Position: Sitting, Cuff Size: Normal)   Pulse 67   Ht  (1.575 m)   Wt 120 lb 6.4 oz (54.6 kg)   BMI 22.02 kg/m   Constitutional:  Alert and oriented, No acute distress.  Laboratory Data: Lab Results  Component Value Date   WBC 9.8 07/20/2016   HGB 12.2 07/20/2016   HCT 34.6 (L) 07/20/2016   MCV 99.7 07/20/2016   PLT 337 07/20/2016    Lab Results  Component Value Date   CREATININE 0.98 07/20/2016    No results found for: PSA  No results found for: TESTOSTERONE  No results found for: HGBA1C  Urinalysis    Component Value Date/Time   COLORURINE STRAW (A) 07/20/2016 2359   APPEARANCEUR Clear 05/31/2017 1134   LABSPEC 1.009 07/20/2016 2359   LABSPEC 1.012 02/25/2014 1511   PHURINE 6.0 07/20/2016 2359   GLUCOSEU Negative 05/31/2017 1134   GLUCOSEU Negative 02/25/2014 1511   HGBUR SMALL (A) 07/20/2016 2359   BILIRUBINUR Negative 05/31/2017 1134   BILIRUBINUR Negative 02/25/2014 1511   KETONESUR NEGATIVE 07/20/2016 2359   PROTEINUR Negative 05/31/2017 1134   PROTEINUR NEGATIVE 07/20/2016 2359   NITRITE Negative 05/31/2017 1134   NITRITE NEGATIVE 07/20/2016 2359   LEUKOCYTESUR Negative 05/31/2017 1134    LEUKOCYTESUR Negative 02/25/2014 1511    Pertinent Imaging:   Assessment & Plan: The patient and I spoke about antimuscarinics and CNS side effects.  We talked about percutaneous tibial nerve stimulation.  I explained the situation twice.  When she asked my opinion my plan is to try to antimuscarinics and if she does not reach her treatment goal personally I would do percutaneous tibial nerve stimulation.  Third-party payer issues discussed.  Toviaz 4 mg samples and prescription given.  Reassess in 5 weeks rare CNS side effects discussed  There are no diagnoses linked to this encounter.  Return in about 5 weeks (around 12/05/2017).  Martina Sinner, MD  [facility] [address], [address] [place], [place] [address] [phone]

## 2017-11-02 DIAGNOSIS — M79602 Pain in left arm: Secondary | ICD-10-CM | POA: Diagnosis not present

## 2017-11-11 DIAGNOSIS — G243 Spasmodic torticollis: Secondary | ICD-10-CM | POA: Diagnosis not present

## 2017-12-05 ENCOUNTER — Ambulatory Visit: Payer: Medicare HMO | Admitting: Urology

## 2017-12-05 ENCOUNTER — Encounter: Payer: Self-pay | Admitting: Urology

## 2018-01-30 DIAGNOSIS — S52501A Unspecified fracture of the lower end of right radius, initial encounter for closed fracture: Secondary | ICD-10-CM | POA: Diagnosis not present

## 2018-02-02 DIAGNOSIS — S52501D Unspecified fracture of the lower end of right radius, subsequent encounter for closed fracture with routine healing: Secondary | ICD-10-CM | POA: Diagnosis not present

## 2018-02-09 DIAGNOSIS — S52501D Unspecified fracture of the lower end of right radius, subsequent encounter for closed fracture with routine healing: Secondary | ICD-10-CM | POA: Diagnosis not present

## 2018-02-15 DIAGNOSIS — R69 Illness, unspecified: Secondary | ICD-10-CM | POA: Diagnosis not present

## 2018-02-15 DIAGNOSIS — O3440 Maternal care for other abnormalities of cervix, unspecified trimester: Secondary | ICD-10-CM | POA: Diagnosis not present

## 2018-02-15 DIAGNOSIS — R0602 Shortness of breath: Secondary | ICD-10-CM | POA: Diagnosis not present

## 2018-02-15 DIAGNOSIS — G243 Spasmodic torticollis: Secondary | ICD-10-CM | POA: Diagnosis not present

## 2018-02-21 DIAGNOSIS — S52501D Unspecified fracture of the lower end of right radius, subsequent encounter for closed fracture with routine healing: Secondary | ICD-10-CM | POA: Diagnosis not present

## 2018-02-27 ENCOUNTER — Encounter: Payer: Self-pay | Admitting: Urology

## 2018-02-27 ENCOUNTER — Ambulatory Visit: Payer: Medicare HMO | Admitting: Urology

## 2018-03-14 DIAGNOSIS — S52501D Unspecified fracture of the lower end of right radius, subsequent encounter for closed fracture with routine healing: Secondary | ICD-10-CM | POA: Diagnosis not present

## 2018-03-14 DIAGNOSIS — S52531D Colles' fracture of right radius, subsequent encounter for closed fracture with routine healing: Secondary | ICD-10-CM | POA: Diagnosis not present

## 2018-03-14 DIAGNOSIS — M25631 Stiffness of right wrist, not elsewhere classified: Secondary | ICD-10-CM | POA: Diagnosis not present

## 2018-03-21 DIAGNOSIS — S52531D Colles' fracture of right radius, subsequent encounter for closed fracture with routine healing: Secondary | ICD-10-CM | POA: Diagnosis not present

## 2018-04-01 ENCOUNTER — Other Ambulatory Visit: Payer: Self-pay

## 2018-04-01 ENCOUNTER — Emergency Department: Payer: Medicare HMO

## 2018-04-01 ENCOUNTER — Emergency Department
Admission: EM | Admit: 2018-04-01 | Discharge: 2018-04-01 | Disposition: A | Discharge status: Home / Self Care | Payer: Medicare HMO | Attending: Emergency Medicine | Admitting: Emergency Medicine

## 2018-04-01 ENCOUNTER — Encounter: Payer: Self-pay | Admitting: Emergency Medicine

## 2018-04-01 DIAGNOSIS — M533 Sacrococcygeal disorders, not elsewhere classified: Secondary | ICD-10-CM | POA: Diagnosis not present

## 2018-04-01 DIAGNOSIS — W19XXXA Unspecified fall, initial encounter: Secondary | ICD-10-CM | POA: Insufficient documentation

## 2018-04-01 DIAGNOSIS — Z7982 Long term (current) use of aspirin: Secondary | ICD-10-CM | POA: Diagnosis not present

## 2018-04-01 DIAGNOSIS — M25552 Pain in left hip: Secondary | ICD-10-CM | POA: Diagnosis present

## 2018-04-01 DIAGNOSIS — Z96659 Presence of unspecified artificial knee joint: Secondary | ICD-10-CM | POA: Insufficient documentation

## 2018-04-01 DIAGNOSIS — I471 Supraventricular tachycardia: Secondary | ICD-10-CM | POA: Insufficient documentation

## 2018-04-01 DIAGNOSIS — Z79899 Other long term (current) drug therapy: Secondary | ICD-10-CM | POA: Insufficient documentation

## 2018-04-01 DIAGNOSIS — S3993XA Unspecified injury of pelvis, initial encounter: Secondary | ICD-10-CM | POA: Diagnosis not present

## 2018-04-01 DIAGNOSIS — R102 Pelvic and perineal pain: Secondary | ICD-10-CM | POA: Diagnosis not present

## 2018-04-01 LAB — BASIC METABOLIC PANEL
Anion gap: 7 (ref 5–15)
BUN: 16 mg/dL (ref 8–23)
CO2: 26 mmol/L (ref 22–32)
CREATININE: 0.85 mg/dL (ref 0.44–1.00)
Calcium: 9 mg/dL (ref 8.9–10.3)
Chloride: 106 mmol/L (ref 98–111)
GFR calc Af Amer: >60 mL/min (ref >60)
GLUCOSE: 99 mg/dL (ref 70–99)
Potassium: 4 mmol/L (ref 3.5–5.1)
SODIUM: 139 mmol/L (ref 135–145)

## 2018-04-01 LAB — CBC
HCT: 37.3 % (ref 36.0–46.0)
Hemoglobin: 12.6 g/dL (ref 12.0–15.0)
MCH: 34.4 pg — AB (ref 26.0–34.0)
MCHC: 33.8 g/dL (ref 30.0–36.0)
MCV: 101.9 fL — ABNORMAL HIGH (ref 80.0–100.0)
Platelets: 328 10*3/uL (ref 150–400)
RBC: 3.66 MIL/uL — ABNORMAL LOW (ref 3.87–5.11)
RDW: 12.5 % (ref 11.5–15.5)
WBC: 5.5 10*3/uL (ref 4.0–10.5)
nRBC: 0 % (ref 0.0–0.2)

## 2018-04-01 LAB — TROPONIN I

## 2018-04-01 NOTE — ED Provider Notes (Signed)
[facility] Emergency Department Provider Note   ____________________________________________    I have reviewed the triage vital signs and the nursing notes.   HISTORY  Chief Complaint Fall     HPI Gabriella Rogers is a 84 y.o. female who presented for a fall.  Patient reports that she fell backwards just prior to arrival and injured her left hip.  She reports she is able to move her left leg.  She reports that she has had falls for years and no one has been able to discover why she has them.  She denies lightheadedness.  No chest pain or palpitations.  No nausea or vomiting.  No head injury or neck injury.  No back pain.  No extremity injuries.  Has not take anything for this.  Planes of pain in her left hip/tailbone is moderate in nature and aching   Past Medical History:  Diagnosis Date  . Brain disorder 03/27/2014  . Cervical dystonia 01/24/2012  . Clinical depression 11/26/2015  . Dizziness 11/26/2015   Overview:  Chronic, felt secondary to inner ear per ENT evaluation approximately 1996.   . H/O total knee replacement 11/23/2015  . Headache, migraine 11/26/2015  . HLD (hyperlipidemia) 11/26/2015  . Hypercholesteremia   . Osteoporosis, post-menopausal 11/26/2015    Patient Active Problem List   Diagnosis Date Noted  . Depression 11/26/2015  . Dizziness 11/26/2015  . Hyperlipidemia, unspecified 11/26/2015  . Headache, migraine 11/26/2015  . Osteoporosis, post-menopausal 11/26/2015  . H/O total knee replacement 11/23/2015  . Arthropathy, traumatic, knee 03/05/2015  . Brain disorder 03/27/2014  . Cervical dystonia 01/24/2012    Past Surgical History:  Procedure Laterality Date  . ABDOMINAL HYSTERECTOMY    . ANKLE SURGERY Left   . KNEE SURGERY Right     Prior to Admission medications   Medication Sig Start Date End Date Taking? Authorizing Provider  aspirin 81 MG tablet Take by mouth. 12/23/09   [provider]  Calcium  Carb-Cholecalciferol (CALTRATE 600+D3 SOFT) 600-800 MG-UNIT CHEW Chew by mouth. 12/23/09   [provider]  clonazePAM (KLONOPIN) 0.5 MG tablet Take 0.5 mg by mouth 2 (two) times daily as needed.     [provider]  donepezil (ARICEPT) 10 MG tablet Take by mouth. 02/03/15   [provider]  fesoterodine (TOVIAZ) 4 MG TB24 tablet Take 1 tablet (4 mg total) by mouth daily. 10/31/17   Alfredo Martinez, MD  mirabegron ER (MYRBETRIQ) 50 MG TB24 tablet Take 1 tablet (50 mg total) by mouth daily. 04/27/16   [name], MD  PARoxetine (PAXIL) 10 MG tablet Take by mouth. 07/29/14   [provider]  rosuvastatin (CRESTOR) 5 MG tablet  09/22/17   [provider]     Allergies Actonel  [risedronate sodium]; Piper; Pneumococcal vaccine; Risedronate; Alendronate; Aleve [naproxen sodium]; and Pravastatin  Family History  Problem Relation Age of Onset  . Kidney cancer Neg Hx   . Hematuria Neg Hx     Social History Social History   Tobacco Use  . Smoking status: Never Smoker  . Smokeless tobacco: Never Used  Substance Use Topics  . Alcohol use: No  . Drug use: Not on file    Review of Systems  Constitutional: No dizziness Eyes: No visual changes.  ENT: No neck pain Cardiovascular: No chest wall pain Respiratory: Denies shortness of breath. Gastrointestinal: No abdominal pain.  No nausea, no vomiting.   Genitourinary: No groin injury Musculoskeletal: As above Skin: Negative for  laceration Neurological: Negative for headaches or weakness   ____________________________________________   PHYSICAL EXAM:  VITAL SIGNS: ED Triage Vitals  Enc Vitals Group     BP 04/01/18 1630 100/86     Pulse Rate 04/01/18 1612 (!) 133     Resp 04/01/18 1612 18     Temp 04/01/18 1612 (!) 97.3 F (36.3 C)     Temp Source 04/01/18 1612 Oral     SpO2 04/01/18 1612 97 %     Weight 04/01/18 1614 54.6 kg (120 lb 5.9 oz)     Height --      Head Circumference --        Peak Flow --      Pain Score 04/01/18 1614 5     Pain Loc --      Pain Edu? --      Excl. in GC? --     Constitutional: Alert and oriented.  Eyes: Conjunctivae are normal.  Head: Atraumatic.  No hematomas Nose: No congestion/rhinnorhea. Mouth/Throat: Mucous membranes are moist.   Neck:  Painless ROM, no vertebral tenderness to palpation Cardiovascular: Normal rate, regular rhythm. Grossly normal heart sounds.  Good peripheral circulation.  No chest wall tenderness palpation Respiratory: Normal respiratory effort.  No retractions. Lungs CTAB. Gastrointestinal: Soft and nontender. No distention.    Musculoskeletal: Able to range all extremity's well without pain.  No pain with axial load on both hips.  No vertebral tenderness palpation.  Warm and well perfused extremities. Neurologic:  Normal speech and language. No gross focal neurologic deficits are appreciated.  Skin:  Skin is warm, dry and intact. No rash noted. Psychiatric: Mood and affect are normal. Speech and behavior are normal.  ____________________________________________   LABS (all labs ordered are listed, but only abnormal results are displayed)  Labs Reviewed  CBC - Abnormal; Notable for the following components:      Result Value   RBC 3.66 (*)    MCV 101.9 (*)    MCH 34.4 (*)    All other components within normal limits  BASIC METABOLIC PANEL  TROPONIN I  URINALYSIS, COMPLETE (UACMP) WITH MICROSCOPIC  CBG MONITORING, ED   ____________________________________________  EKG  ED ECG REPORT I, Jene Every, the attending physician, personally viewed and interpreted this ECG.  Date: 04/01/2018  Rhythm: SVT QRS Axis: normal Intervals: normal ST/T Wave abnormalities: Nonspecific changes Narrative Interpretation: no evidence of acute ischemia  ____________________________________________  RADIOLOGY  pelvisx-ray Coccyx  x-ray ____________________________________________   PROCEDURES  Procedure(s) performed: No  Procedures   Critical Care performed: No ____________________________________________   INITIAL IMPRESSION / ASSESSMENT AND PLAN / ED COURSE  Pertinent labs & imaging results that were available during my care of the patient were reviewed by me and considered in my medical decision making (see chart for details).  Patient presents after fall, she denies dizziness.  No LOC.  She reports this has happened multiple times over the years.  She does not know what causes it.  She complains of pain in her left tailbone, no pain with axial load on both hips, doubt hip fracture.  Suspicious for possible pelvis or coccyx injury.  Pending x-rays, labs.  Initial EKG concerning for SVT, this may be a cause of her falls, her heart rate is normalized however.  Discussed need for follow-up with cardiology given extremely intermittent symptoms and possible Holter monitor.  Lab work is unremarkable.  X-rays are negative for fractures.  Supportive care    ____________________________________________   FINAL CLINICAL IMPRESSION(S) /  ED DIAGNOSES  Final diagnoses:  Fall, initial encounter  SVT (supraventricular tachycardia) (HCC)        Note:  This document was prepared using Dragon voice recognition software and may include unintentional dictation errors.    Jene Every, MD 04/01/18 2052

## 2018-04-01 NOTE — ED Triage Notes (Addendum)
Pt to ed with c/o fall today while at home.  Pt states she falls frequently but can't explain why.  Pt reports she fell backwards landing on her "bottom"  Pt alert and oriented at this time. Pt HR on monitor is 130's

## 2018-04-01 NOTE — ED Notes (Signed)
Patient denies pain and is resting comfortably.  

## 2018-04-10 DIAGNOSIS — I471 Supraventricular tachycardia: Secondary | ICD-10-CM | POA: Diagnosis not present

## 2018-04-10 DIAGNOSIS — R296 Repeated falls: Secondary | ICD-10-CM | POA: Diagnosis not present

## 2018-04-10 DIAGNOSIS — E785 Hyperlipidemia, unspecified: Secondary | ICD-10-CM | POA: Diagnosis not present

## 2018-04-11 DIAGNOSIS — S52531D Colles' fracture of right radius, subsequent encounter for closed fracture with routine healing: Secondary | ICD-10-CM | POA: Diagnosis not present

## 2018-04-13 DIAGNOSIS — G243 Spasmodic torticollis: Secondary | ICD-10-CM | POA: Diagnosis not present

## 2018-04-17 DIAGNOSIS — M8588 Other specified disorders of bone density and structure, other site: Secondary | ICD-10-CM | POA: Diagnosis not present

## 2018-04-17 DIAGNOSIS — M85852 Other specified disorders of bone density and structure, left thigh: Secondary | ICD-10-CM | POA: Diagnosis not present

## 2018-04-17 DIAGNOSIS — Z780 Asymptomatic menopausal state: Secondary | ICD-10-CM | POA: Diagnosis not present

## 2018-04-17 DIAGNOSIS — M810 Age-related osteoporosis without current pathological fracture: Secondary | ICD-10-CM | POA: Diagnosis not present

## 2018-04-20 DIAGNOSIS — E7800 Pure hypercholesterolemia, unspecified: Secondary | ICD-10-CM | POA: Diagnosis not present

## 2018-04-20 DIAGNOSIS — I471 Supraventricular tachycardia: Secondary | ICD-10-CM | POA: Diagnosis not present

## 2018-04-24 ENCOUNTER — Ambulatory Visit: Payer: Medicare HMO | Admitting: Urology

## 2018-05-04 DIAGNOSIS — B351 Tinea unguium: Secondary | ICD-10-CM | POA: Diagnosis not present

## 2018-05-04 DIAGNOSIS — M79674 Pain in right toe(s): Secondary | ICD-10-CM | POA: Diagnosis not present

## 2018-05-04 DIAGNOSIS — M79675 Pain in left toe(s): Secondary | ICD-10-CM | POA: Diagnosis not present

## 2018-05-15 ENCOUNTER — Other Ambulatory Visit: Payer: Self-pay

## 2018-05-15 DIAGNOSIS — S92512A Displaced fracture of proximal phalanx of left lesser toe(s), initial encounter for closed fracture: Secondary | ICD-10-CM | POA: Diagnosis not present

## 2018-05-15 DIAGNOSIS — S62653A Nondisplaced fracture of medial phalanx of left middle finger, initial encounter for closed fracture: Secondary | ICD-10-CM | POA: Diagnosis not present

## 2018-05-15 DIAGNOSIS — S6992XA Unspecified injury of left wrist, hand and finger(s), initial encounter: Secondary | ICD-10-CM | POA: Diagnosis not present

## 2018-05-15 DIAGNOSIS — W010XXA Fall on same level from slipping, tripping and stumbling without subsequent striking against object, initial encounter: Secondary | ICD-10-CM | POA: Diagnosis not present

## 2018-05-15 MED ORDER — MIRABEGRON ER 50 MG PO TB24: 50.0000 mg | ORAL_TABLET | Freq: Every day | ORAL | 3 refills | Status: DC

## 2018-05-16 DIAGNOSIS — H2513 Age-related nuclear cataract, bilateral: Secondary | ICD-10-CM | POA: Diagnosis not present

## 2018-05-23 DIAGNOSIS — S62653A Nondisplaced fracture of medial phalanx of left middle finger, initial encounter for closed fracture: Secondary | ICD-10-CM | POA: Diagnosis not present

## 2018-05-23 DIAGNOSIS — M25542 Pain in joints of left hand: Secondary | ICD-10-CM | POA: Diagnosis not present

## 2018-06-06 DIAGNOSIS — S62653A Nondisplaced fracture of medial phalanx of left middle finger, initial encounter for closed fracture: Secondary | ICD-10-CM | POA: Diagnosis not present

## 2018-06-06 DIAGNOSIS — M25542 Pain in joints of left hand: Secondary | ICD-10-CM | POA: Diagnosis not present

## 2018-06-12 ENCOUNTER — Ambulatory Visit: Payer: Medicare HMO | Admitting: Urology

## 2018-06-19 ENCOUNTER — Ambulatory Visit: Payer: Medicare HMO | Admitting: Urology

## 2018-06-19 ENCOUNTER — Encounter: Payer: Self-pay | Admitting: Urology

## 2018-06-19 VITALS — BP 122/74 | HR 74 | Ht 62.0 in | Wt 119.4 lb

## 2018-06-19 DIAGNOSIS — N3946 Mixed incontinence: Secondary | ICD-10-CM

## 2018-06-19 MED ORDER — OXYBUTYNIN CHLORIDE ER 10 MG PO TB24: 10.0000 mg | ORAL_TABLET | Freq: Every day | ORAL | 11 refills | Status: DC

## 2018-06-19 NOTE — Progress Notes (Signed)
06/19/2018 3:43 PM   Gabriella Rogers 07/20/1933 [id number]  Referring provider: Corky DownsMasoud, Javed, MD [address] [place], [place] [address]  Chief Complaint  Patient presents with  . Follow-up    HPI: Dr Apolinar JunesBrandon: The patient has urgency incontinence..  She can leak a lot when she goes from a sitting to standing position.  She failed Myrbetriq and she has memory issues.  Dr. B was concerned about antimuscarinics because of her memory and had discussed percutaneous tibial nerve stimulation with her.   last The patient has urge incontinence and foot on the floor syndrome.  She has mild bedwetting.  She denies stress incontinence or she does it minimal.  She wears 3 pads a day sometimes damp sometimes moderately wet.  She does admit she has some short-term memory issues.  She believes she has had 2 TIAs.  She is currently on Myrbetriq with mild benefit  The patient and I spoke about antimuscarinics and CNS side effects.  We talked about percutaneous tibial nerve stimulation.  I explained the situation twice.  When she asked my opinion my plan is to try to antimuscarinics and if she does not reach her treatment goal personally I would do percutaneous tibial nerve stimulation.  Third-party payer issues discussed.  Toviaz 4 mg samples and prescription given.  Reassess in 5 weeks rare CNS side effects discussed   Frequency stable.  Patient is back on Myrbetriq with minimal benefit.  Clinically not infected.  Urge incontinence persist.  Again memory I think is an issue.   PMH: Past Medical History:  Diagnosis Date  . Brain disorder 03/27/2014  . Cervical dystonia 01/24/2012  . Clinical depression 11/26/2015  . Dizziness 11/26/2015   Overview:  Chronic, felt secondary to inner ear per ENT evaluation approximately 1996.   . H/O total knee replacement 11/23/2015  . Headache, migraine 11/26/2015  . HLD (hyperlipidemia) 11/26/2015  . Hypercholesteremia   . Osteoporosis, post-menopausal 11/26/2015     Surgical History: Past Surgical History:  Procedure Laterality Date  . ABDOMINAL HYSTERECTOMY    . ANKLE SURGERY Left   . KNEE SURGERY Right     Home Medications:  Allergies as of 06/19/2018      Reactions   Actonel  [risedronate Sodium]    Piper    Pneumococcal Vaccine Other (See Comments)   WEAKNESS, DISORIENTED, "ARM SORE, RED, HURTING"   Risedronate Other (See Comments)   REDNESS IN FACE   Alendronate Rash   Aleve [naproxen Sodium] Rash   Pravastatin Rash      Medication List       Accurate as of June 19, 2018  3:43 PM. Always use your most recent med list.        aspirin 81 MG tablet Take by mouth.   clonazePAM 0.5 MG tablet Commonly known as:  KLONOPIN Take 0.5 mg by mouth 2 (two) times daily as needed.   donepezil 10 MG tablet Commonly known as:  ARICEPT Take by mouth.   fesoterodine 4 MG Tb24 tablet Commonly known as:  TOVIAZ Take 1 tablet (4 mg total) by mouth daily.   mirabegron ER 50 MG Tb24 tablet Commonly known as:  MYRBETRIQ Take 1 tablet (50 mg total) by mouth daily.   PAXIL 10 MG tablet Generic drug:  PARoxetine Take by mouth.       Allergies:  Allergies  Allergen Reactions  . Actonel  [Risedronate Sodium]   . Piper   . Pneumococcal Vaccine Other (See Comments)  WEAKNESS, DISORIENTED, "ARM SORE, RED, HURTING"  . Risedronate Other (See Comments)    REDNESS IN FACE  . Alendronate Rash  . Aleve [Naproxen Sodium] Rash  . Pravastatin Rash    Family History: Family History  Problem Relation Age of Onset  . Kidney cancer Neg Hx   . Hematuria Neg Hx     Social History:  reports that she has never smoked. She has never used smokeless tobacco. She reports that she does not drink alcohol. No history on file for drug.  ROS: UROLOGY Frequent Urination?: Yes Hard to postpone urination?: No Burning/pain with urination?: No Get up at night to urinate?: No Leakage of urine?: Yes Urine stream starts and stops?: No Trouble  starting stream?: No Do you have to strain to urinate?: No Blood in urine?: No Urinary tract infection?: No Sexually transmitted disease?: No Injury to kidneys or bladder?: No Painful intercourse?: No Weak stream?: No Currently pregnant?: No Vaginal bleeding?: No Last menstrual period?: n  Gastrointestinal Nausea?: No Vomiting?: No Indigestion/heartburn?: No Diarrhea?: No Constipation?: No  Constitutional Fever: No Night sweats?: No Weight loss?: No Fatigue?: Yes  Skin Skin rash/lesions?: No Itching?: No  Eyes Blurred vision?: No Double vision?: No  Ears/Nose/Throat Sore throat?: No Sinus problems?: No  Hematologic/Lymphatic Swollen glands?: No Easy bruising?: Yes  Cardiovascular Leg swelling?: No Chest pain?: No  Respiratory Cough?: No Shortness of breath?: No  Endocrine Excessive thirst?: No  Musculoskeletal Back pain?: No Joint pain?: No  Neurological Headaches?: No Dizziness?: Yes  Psychologic Depression?: No Anxiety?: Yes  Physical Exam: BP 122/74 (BP Location: Left Arm, Patient Position: Sitting, Cuff Size: Normal)   Pulse 74   Ht 5\' 2"  (1.575 m)   Wt 119 lb 6.4 oz (54.2 kg)   BMI 21.84 kg/m     Laboratory Data: Lab Results  Component Value Date   WBC 5.5 04/01/2018   HGB 12.6 04/01/2018   HCT 37.3 04/01/2018   MCV 101.9 (H) 04/01/2018   PLT 328 04/01/2018    Lab Results  Component Value Date   CREATININE 0.85 04/01/2018    No results found for: PSA  No results found for: TESTOSTERONE  No results found for: HGBA1C  Urinalysis    Component Value Date/Time   COLORURINE STRAW (A) 07/20/2016 2359   APPEARANCEUR Clear 10/31/2017 1420   LABSPEC 1.009 07/20/2016 2359   LABSPEC 1.012 02/25/2014 1511   PHURINE 6.0 07/20/2016 2359   GLUCOSEU Negative 10/31/2017 1420   GLUCOSEU Negative 02/25/2014 1511   HGBUR SMALL (A) 07/20/2016 2359   BILIRUBINUR Negative 10/31/2017 1420   BILIRUBINUR Negative 02/25/2014 1511    KETONESUR NEGATIVE 07/20/2016 2359   PROTEINUR Trace (A) 10/31/2017 1420   PROTEINUR NEGATIVE 07/20/2016 2359   NITRITE Negative 10/31/2017 1420   NITRITE NEGATIVE 07/20/2016 2359   LEUKOCYTESUR Negative 10/31/2017 1420   LEUKOCYTESUR Negative 02/25/2014 1511    Pertinent Imaging:   Assessment & Plan: Reassess in 10 weeks on oxybutynin ER 10 mg.  I mention percutaneous tibial nerve stimulation and she has not thought about it but I will review it next time again with her if her symptoms persist.  There are no diagnoses linked to this encounter.  No follow-ups on file.  Martina SinnerScott A Lucille Crichlow, MD  [facility] [address], [address] [place], [place] [address] [phone]

## 2018-06-19 NOTE — Addendum Note (Signed)
Addended by: Garfield CorneaMABRY, JASMINE L on: 06/19/2018 03:44 PM   Modules accepted: Orders

## 2018-06-26 DIAGNOSIS — G243 Spasmodic torticollis: Secondary | ICD-10-CM | POA: Diagnosis not present

## 2018-06-26 DIAGNOSIS — R69 Illness, unspecified: Secondary | ICD-10-CM | POA: Diagnosis not present

## 2018-06-26 DIAGNOSIS — W19XXXA Unspecified fall, initial encounter: Secondary | ICD-10-CM | POA: Diagnosis not present

## 2018-06-26 DIAGNOSIS — O3440 Maternal care for other abnormalities of cervix, unspecified trimester: Secondary | ICD-10-CM | POA: Diagnosis not present

## 2018-08-14 ENCOUNTER — Ambulatory Visit: Payer: Medicare HMO | Admitting: Urology

## 2018-08-14 ENCOUNTER — Encounter: Payer: Self-pay | Admitting: Urology

## 2018-08-14 VITALS — BP 146/79 | HR 61 | Ht 63.0 in | Wt 122.0 lb

## 2018-08-14 DIAGNOSIS — N3946 Mixed incontinence: Secondary | ICD-10-CM | POA: Diagnosis not present

## 2018-08-14 MED ORDER — TOLTERODINE TARTRATE ER 4 MG PO CP24: 4.0000 mg | ORAL_CAPSULE | Freq: Every day | ORAL | 11 refills | Status: DC

## 2018-08-14 NOTE — Progress Notes (Signed)
08/14/2018 11:36 AM   Gabriella Rogers [birth year] [id number]  Referring provider: Corky Downs, MD [address] [place], [place] [address]  Chief Complaint  Patient presents with  . mixed incontinence    HPI: Dr Keane Scrape patient has urgency incontinence.. She can leak a lot when she goes from a sitting to standing position. She failed Myrbetriq and she has memory issues.Dr. B was concerned about antimuscarinics because of her memory and had discussed percutaneous tibial nerve stimulation with her.  last The patient has urge incontinence and foot on the floor syndrome. She has mild bedwetting. She denies stress incontinence or she does it minimal. She wears 3 pads a day sometimes damp sometimes moderately wet. She does admit she has some short-term memory issues. She believes she has had 2 TIAs. She is currently on Myrbetriq with mild benefit  The patient and I spoke about antimuscarinics and CNS side effects. We talked about percutaneous tibial nerve stimulation. I explained the situation twice. When she asked my opinion my plan is to try to antimuscarinics and if she does not reach her treatment goal personally I would do percutaneous tibial nerve stimulation. Third-party payer issues discussed. Toviaz 4 mg samples and prescription given. Reassess in 5 weeks rare CNS side effects discussed  Frequency stable.  Patient is back on Myrbetriq with minimal benefit.  Clinically not infected.  Urge incontinence persist.  Again memory I think is an issue.  Today I gave her oxybutynin ER 10 mg last time.  She had not thought about the percutaneous tibial nerve stimulation.  He still has urge incontinence.  History again a little bit difficult and a bit circular.  Clinically not infected  I went over percutaneous tibial nerve stimulation and gave her handout   PMH: Past Medical History:  Diagnosis Date  . Brain disorder 03/27/2014  . Cervical dystonia 01/24/2012  .  Clinical depression 11/26/2015  . Dizziness 11/26/2015   Overview:  Chronic, felt secondary to inner ear per ENT evaluation approximately 1996.   . H/O total knee replacement 11/23/2015  . Headache, migraine 11/26/2015  . HLD (hyperlipidemia) 11/26/2015  . Hypercholesteremia   . Osteoporosis, post-menopausal 11/26/2015    Surgical History: Past Surgical History:  Procedure Laterality Date  . ABDOMINAL HYSTERECTOMY    . ANKLE SURGERY Left   . KNEE SURGERY Right     Home Medications:  Allergies as of 08/14/2018      Reactions   Actonel  [risedronate Sodium]    Piper    Pneumococcal Vaccine Other (See Comments)   WEAKNESS, DISORIENTED, "ARM SORE, RED, HURTING"   Risedronate Other (See Comments)   REDNESS IN FACE   Alendronate Rash   Aleve [naproxen Sodium] Rash   Pravastatin Rash      Medication List       Accurate as of August 14, 2018 11:36 AM. Always use your most recent med list.        aspirin 81 MG tablet Take by mouth.   clonazePAM 0.5 MG tablet Commonly known as:  KLONOPIN Take 0.5 mg by mouth 2 (two) times daily as needed.   donepezil 10 MG tablet Commonly known as:  ARICEPT Take by mouth.   famotidine 20 MG tablet Commonly known as:  PEPCID   fesoterodine 4 MG Tb24 tablet Commonly known as:  TOVIAZ Take 1 tablet (4 mg total) by mouth daily.   mirabegron ER 50 MG Tb24 tablet Commonly known as:  MYRBETRIQ Take 1 tablet (50 mg total) by mouth  daily.   oxybutynin 10 MG 24 hr tablet Commonly known as:  DITROPAN-XL Take 1 tablet (10 mg total) by mouth daily.   PAXIL 10 MG tablet Generic drug:  PARoxetine Take by mouth.   tolterodine 4 MG 24 hr capsule Commonly known as:  DETROL LA Take 1 capsule (4 mg total) by mouth daily.       Allergies:  Allergies  Allergen Reactions  . Actonel  [Risedronate Sodium]   . Piper   . Pneumococcal Vaccine Other (See Comments)    WEAKNESS, DISORIENTED, "ARM SORE, RED, HURTING"  . Risedronate Other (See Comments)      REDNESS IN FACE  . Alendronate Rash  . Aleve [Naproxen Sodium] Rash  . Pravastatin Rash    Family History: Family History  Problem Relation Age of Onset  . Kidney cancer Neg Hx   . Hematuria Neg Hx     Social History:  reports that she has never smoked. She has never used smokeless tobacco. She reports that she does not drink alcohol. No history on file for drug.  ROS: UROLOGY Frequent Urination?: Yes Hard to postpone urination?: Yes Burning/pain with urination?: No Get up at night to urinate?: Yes Leakage of urine?: Yes Urine stream starts and stops?: No Trouble starting stream?: No Do you have to strain to urinate?: No Blood in urine?: No Urinary tract infection?: No Sexually transmitted disease?: No Injury to kidneys or bladder?: No Painful intercourse?: No Weak stream?: No Currently pregnant?: No Vaginal bleeding?: No Last menstrual period?: n  Gastrointestinal Nausea?: No Vomiting?: No Indigestion/heartburn?: No Diarrhea?: No Constipation?: No  Constitutional Fever: No Night sweats?: No Weight loss?: No Fatigue?: Yes  Skin Skin rash/lesions?: No Itching?: No  Eyes Blurred vision?: No Double vision?: No  Ears/Nose/Throat Sore throat?: No Sinus problems?: No  Hematologic/Lymphatic Swollen glands?: No Easy bruising?: Yes  Cardiovascular Leg swelling?: No Chest pain?: No  Respiratory Cough?: No Shortness of breath?: Yes  Endocrine Excessive thirst?: No  Musculoskeletal Back pain?: Yes Joint pain?: No  Neurological Headaches?: No Dizziness?: Yes  Psychologic Depression?: No Anxiety?: Yes  Physical Exam: BP (!) 146/79 (BP Location: Left Arm, Patient Position: Sitting)   Pulse 61   Ht 5\' 3"  (1.6 m)   Wt 122 lb (55.3 kg)   BMI 21.61 kg/m   Constitutional:  Alert and oriented, No acute distress.  Lab Results  Component Value Date   WBC 5.5 04/01/2018   HGB 12.6 04/01/2018   HCT 37.3 04/01/2018   MCV 101.9 (H)  04/01/2018   PLT 328 04/01/2018    Lab Results  Component Value Date   CREATININE 0.85 04/01/2018    No results found for: PSA  No results found for: TESTOSTERONE  No results found for: HGBA1C  Urinalysis    Component Value Date/Time   COLORURINE STRAW (A) 07/20/2016 2359   APPEARANCEUR Clear 10/31/2017 1420   LABSPEC 1.009 07/20/2016 2359   LABSPEC 1.012 02/25/2014 1511   PHURINE 6.0 07/20/2016 2359   GLUCOSEU Negative 10/31/2017 1420   GLUCOSEU Negative 02/25/2014 1511   HGBUR SMALL (A) 07/20/2016 2359   BILIRUBINUR Negative 10/31/2017 1420   BILIRUBINUR Negative 02/25/2014 1511   KETONESUR NEGATIVE 07/20/2016 2359   PROTEINUR Trace (A) 10/31/2017 1420   PROTEINUR NEGATIVE 07/20/2016 2359   NITRITE Negative 10/31/2017 1420   NITRITE NEGATIVE 07/20/2016 2359   LEUKOCYTESUR Negative 10/31/2017 1420   LEUKOCYTESUR Negative 02/25/2014 1511    Pertinent Imaging:   Assessment & Plan: At her request I called  in Detrol LA 4 mg a 30 tablets 11 refills.  I think we need to have reasonable goals.  I gave her handout of urgent PC.  She will call if she wishes to have this.  It may be difficult to reach her treatment goals and I think her memory clinically is an issue.  There are no diagnoses linked to this encounter.  No follow-ups on file.  Martina Sinner, MD  [facility] [address] [place], [place] [address] [phone]

## 2018-08-17 DIAGNOSIS — R69 Illness, unspecified: Secondary | ICD-10-CM | POA: Diagnosis not present

## 2018-08-17 DIAGNOSIS — G243 Spasmodic torticollis: Secondary | ICD-10-CM | POA: Diagnosis not present

## 2018-08-17 DIAGNOSIS — Y92009 Unspecified place in unspecified non-institutional (private) residence as the place of occurrence of the external cause: Secondary | ICD-10-CM | POA: Diagnosis not present

## 2018-08-17 DIAGNOSIS — W19XXXA Unspecified fall, initial encounter: Secondary | ICD-10-CM | POA: Diagnosis not present

## 2018-09-21 DIAGNOSIS — W19XXXA Unspecified fall, initial encounter: Secondary | ICD-10-CM | POA: Diagnosis not present

## 2018-09-21 DIAGNOSIS — G243 Spasmodic torticollis: Secondary | ICD-10-CM | POA: Diagnosis not present

## 2018-09-21 DIAGNOSIS — E785 Hyperlipidemia, unspecified: Secondary | ICD-10-CM | POA: Diagnosis not present

## 2018-09-21 DIAGNOSIS — Y92009 Unspecified place in unspecified non-institutional (private) residence as the place of occurrence of the external cause: Secondary | ICD-10-CM | POA: Diagnosis not present

## 2018-10-16 DIAGNOSIS — R350 Frequency of micturition: Secondary | ICD-10-CM | POA: Diagnosis not present

## 2018-11-01 DIAGNOSIS — W19XXXA Unspecified fall, initial encounter: Secondary | ICD-10-CM | POA: Diagnosis not present

## 2018-11-01 DIAGNOSIS — N390 Urinary tract infection, site not specified: Secondary | ICD-10-CM | POA: Diagnosis not present

## 2018-11-01 DIAGNOSIS — E785 Hyperlipidemia, unspecified: Secondary | ICD-10-CM | POA: Diagnosis not present

## 2018-11-01 DIAGNOSIS — Y92009 Unspecified place in unspecified non-institutional (private) residence as the place of occurrence of the external cause: Secondary | ICD-10-CM | POA: Diagnosis not present

## 2018-11-22 DIAGNOSIS — E785 Hyperlipidemia, unspecified: Secondary | ICD-10-CM | POA: Diagnosis not present

## 2018-11-22 DIAGNOSIS — R69 Illness, unspecified: Secondary | ICD-10-CM | POA: Diagnosis not present

## 2018-11-22 DIAGNOSIS — R109 Unspecified abdominal pain: Secondary | ICD-10-CM | POA: Diagnosis not present

## 2018-11-22 DIAGNOSIS — O3440 Maternal care for other abnormalities of cervix, unspecified trimester: Secondary | ICD-10-CM | POA: Diagnosis not present

## 2018-11-24 ENCOUNTER — Other Ambulatory Visit: Payer: Self-pay | Admitting: Internal Medicine

## 2018-11-24 DIAGNOSIS — R109 Unspecified abdominal pain: Secondary | ICD-10-CM

## 2018-11-29 ENCOUNTER — Ambulatory Visit
Admission: RE | Admit: 2018-11-29 | Discharge: 2018-11-29 | Disposition: A | Discharge status: Home / Self Care | Payer: Medicare HMO | Source: Ambulatory Visit | Attending: Internal Medicine | Admitting: Internal Medicine

## 2018-11-29 ENCOUNTER — Other Ambulatory Visit: Payer: Self-pay

## 2018-11-29 DIAGNOSIS — R109 Unspecified abdominal pain: Secondary | ICD-10-CM | POA: Insufficient documentation

## 2018-12-06 DIAGNOSIS — G243 Spasmodic torticollis: Secondary | ICD-10-CM | POA: Diagnosis not present

## 2018-12-06 DIAGNOSIS — E785 Hyperlipidemia, unspecified: Secondary | ICD-10-CM | POA: Diagnosis not present

## 2018-12-26 DIAGNOSIS — G243 Spasmodic torticollis: Secondary | ICD-10-CM | POA: Diagnosis not present

## 2019-01-02 DIAGNOSIS — Z0000 Encounter for general adult medical examination without abnormal findings: Secondary | ICD-10-CM | POA: Diagnosis not present

## 2019-01-02 DIAGNOSIS — R69 Illness, unspecified: Secondary | ICD-10-CM | POA: Diagnosis not present

## 2019-01-02 DIAGNOSIS — W19XXXA Unspecified fall, initial encounter: Secondary | ICD-10-CM | POA: Diagnosis not present

## 2019-01-02 DIAGNOSIS — Y92009 Unspecified place in unspecified non-institutional (private) residence as the place of occurrence of the external cause: Secondary | ICD-10-CM | POA: Diagnosis not present

## 2019-01-02 DIAGNOSIS — O3440 Maternal care for other abnormalities of cervix, unspecified trimester: Secondary | ICD-10-CM | POA: Diagnosis not present

## 2019-01-15 DIAGNOSIS — H2513 Age-related nuclear cataract, bilateral: Secondary | ICD-10-CM | POA: Diagnosis not present

## 2019-01-18 DIAGNOSIS — N390 Urinary tract infection, site not specified: Secondary | ICD-10-CM | POA: Diagnosis not present

## 2019-01-18 DIAGNOSIS — W19XXXA Unspecified fall, initial encounter: Secondary | ICD-10-CM | POA: Diagnosis not present

## 2019-01-18 DIAGNOSIS — R2689 Other abnormalities of gait and mobility: Secondary | ICD-10-CM | POA: Diagnosis not present

## 2019-01-18 DIAGNOSIS — G243 Spasmodic torticollis: Secondary | ICD-10-CM | POA: Diagnosis not present

## 2019-02-22 DIAGNOSIS — E785 Hyperlipidemia, unspecified: Secondary | ICD-10-CM | POA: Diagnosis not present

## 2019-02-22 DIAGNOSIS — Y92009 Unspecified place in unspecified non-institutional (private) residence as the place of occurrence of the external cause: Secondary | ICD-10-CM | POA: Diagnosis not present

## 2019-02-22 DIAGNOSIS — Z008 Encounter for other general examination: Secondary | ICD-10-CM | POA: Diagnosis not present

## 2019-02-22 DIAGNOSIS — R0600 Dyspnea, unspecified: Secondary | ICD-10-CM | POA: Diagnosis not present

## 2019-02-22 DIAGNOSIS — G243 Spasmodic torticollis: Secondary | ICD-10-CM | POA: Diagnosis not present

## 2019-04-03 DIAGNOSIS — M109 Gout, unspecified: Secondary | ICD-10-CM | POA: Diagnosis not present

## 2019-04-03 DIAGNOSIS — G243 Spasmodic torticollis: Secondary | ICD-10-CM | POA: Diagnosis not present

## 2019-04-03 DIAGNOSIS — R69 Illness, unspecified: Secondary | ICD-10-CM | POA: Diagnosis not present

## 2019-04-10 DIAGNOSIS — M109 Gout, unspecified: Secondary | ICD-10-CM | POA: Diagnosis not present

## 2019-04-10 DIAGNOSIS — G243 Spasmodic torticollis: Secondary | ICD-10-CM | POA: Diagnosis not present

## 2019-04-10 DIAGNOSIS — R69 Illness, unspecified: Secondary | ICD-10-CM | POA: Diagnosis not present

## 2019-04-10 DIAGNOSIS — Z2821 Immunization not carried out because of patient refusal: Secondary | ICD-10-CM | POA: Diagnosis not present

## 2019-04-30 ENCOUNTER — Ambulatory Visit: Payer: Medicare HMO | Admitting: Urology

## 2019-05-29 DIAGNOSIS — G243 Spasmodic torticollis: Secondary | ICD-10-CM | POA: Diagnosis not present

## 2019-05-29 DIAGNOSIS — G3184 Mild cognitive impairment, so stated: Secondary | ICD-10-CM | POA: Diagnosis not present

## 2019-06-27 DIAGNOSIS — E785 Hyperlipidemia, unspecified: Secondary | ICD-10-CM | POA: Diagnosis not present

## 2019-06-27 DIAGNOSIS — R399 Unspecified symptoms and signs involving the genitourinary system: Secondary | ICD-10-CM | POA: Diagnosis not present

## 2019-06-27 DIAGNOSIS — R69 Illness, unspecified: Secondary | ICD-10-CM | POA: Diagnosis not present

## 2019-06-27 DIAGNOSIS — O3440 Maternal care for other abnormalities of cervix, unspecified trimester: Secondary | ICD-10-CM | POA: Diagnosis not present

## 2019-07-16 DIAGNOSIS — H2513 Age-related nuclear cataract, bilateral: Secondary | ICD-10-CM | POA: Diagnosis not present

## 2019-11-22 ENCOUNTER — Ambulatory Visit: Payer: Medicare HMO | Admitting: Internal Medicine

## 2019-12-06 ENCOUNTER — Ambulatory Visit (INDEPENDENT_AMBULATORY_CARE_PROVIDER_SITE_OTHER): Payer: Medicare HMO | Admitting: Internal Medicine

## 2019-12-06 ENCOUNTER — Encounter: Payer: Self-pay | Admitting: Internal Medicine

## 2019-12-06 ENCOUNTER — Other Ambulatory Visit: Payer: Self-pay

## 2019-12-06 VITALS — BP 127/68 | HR 91 | Ht 62.5 in | Wt 122.2 lb

## 2019-12-06 DIAGNOSIS — G243 Spasmodic torticollis: Secondary | ICD-10-CM | POA: Diagnosis not present

## 2019-12-06 DIAGNOSIS — N3000 Acute cystitis without hematuria: Secondary | ICD-10-CM

## 2019-12-06 DIAGNOSIS — E782 Mixed hyperlipidemia: Secondary | ICD-10-CM

## 2019-12-06 DIAGNOSIS — R309 Painful micturition, unspecified: Secondary | ICD-10-CM | POA: Diagnosis not present

## 2019-12-06 DIAGNOSIS — R42 Dizziness and giddiness: Secondary | ICD-10-CM | POA: Diagnosis not present

## 2019-12-06 LAB — POCT URINALYSIS DIPSTICK
Bilirubin, UA: NEGATIVE
Blood, UA: NEGATIVE
Glucose, UA: NEGATIVE
Nitrite, UA: POSITIVE
Protein, UA: NEGATIVE
Spec Grav, UA: >=1.03 — AB (ref 1.010–1.025)
Urobilinogen, UA: >=8 E.U./dL — AB
pH, UA: 6 (ref 5.0–8.0)

## 2019-12-06 MED ORDER — NITROFURANTOIN MACROCRYSTAL 50 MG PO CAPS: 50.0000 mg | ORAL_CAPSULE | Freq: Two times a day (BID) | ORAL | 0 refills | Status: AC

## 2019-12-06 NOTE — Progress Notes (Signed)
Established Patient Office Visit  Subjective:  Patient ID: Gabriella Rogers, female    DOB: 09/17/1933  Age: 86 y.o. MRN: [id number]  CC:  Chief Complaint  Patient presents with  . Urinary Tract Infection    Burning and stinging with urination, also frequency    HPI  Gabriella Rogers presents for burning urination.  Past Medical History:  Diagnosis Date  . Brain disorder 03/27/2014  . Cervical dystonia 01/24/2012  . Clinical depression 11/26/2015  . Dizziness 11/26/2015   Overview:  Chronic, felt secondary to inner ear per ENT evaluation approximately 1996.   . H/O total knee replacement 11/23/2015  . Headache, migraine 11/26/2015  . HLD (hyperlipidemia) 11/26/2015  . Hypercholesteremia   . Osteoporosis, post-menopausal 11/26/2015    Past Surgical History:  Procedure Laterality Date  . ABDOMINAL HYSTERECTOMY    . ANKLE SURGERY Left   . KNEE SURGERY Right     Family History  Problem Relation Age of Onset  . Kidney cancer Neg Hx   . Hematuria Neg Hx     Social History   Socioeconomic History  . Marital status: Married    Spouse name: Not on file  . Number of children: Not on file  . Years of education: Not on file  . Highest education level: Not on file  Occupational History  . Not on file  Tobacco Use  . Smoking status: Never Smoker  . Smokeless tobacco: Never Used  Substance and Sexual Activity  . Alcohol use: No  . Drug use: Not on file  . Sexual activity: Not on file  Other Topics Concern  . Not on file  Social History Narrative  . Not on file   Social Determinants of Health   Financial Resource Strain:   . Difficulty of Paying Living Expenses:   Food Insecurity:   . Worried About Programme researcher, broadcasting/film/video in the Last Year:   . Barista in the Last Year:   Transportation Needs:   . Freight forwarder (Medical):   Marland Kitchen Lack of Transportation (Non-Medical):   Physical Activity:   . Days of Exercise per Week:   . Minutes of Exercise per Session:     Stress:   . Feeling of Stress :   Social Connections:   . Frequency of Communication with Friends and Family:   . Frequency of Social Gatherings with Friends and Family:   . Attends Religious Services:   . Active Member of Clubs or Organizations:   . Attends Banker Meetings:   Marland Kitchen Marital Status:   Intimate Partner Violence:   . Fear of Current or Ex-Partner:   . Emotionally Abused:   Marland Kitchen Physically Abused:   . Sexually Abused:      Current Outpatient Medications:  .  clonazePAM (KLONOPIN) 0.5 MG tablet, Take 0.5 mg by mouth 2 (two) times daily as needed. , Disp: , Rfl:  .  donepezil (ARICEPT) 10 MG tablet, Take by mouth., Disp: , Rfl:  .  fesoterodine (TOVIAZ) 4 MG TB24 tablet, Take 1 tablet (4 mg total) by mouth daily., Disp: 30 tablet, Rfl: 11 .  mirabegron ER (MYRBETRIQ) 50 MG TB24 tablet, Take 1 tablet (50 mg total) by mouth daily., Disp: 90 tablet, Rfl: 3 .  tolterodine (DETROL LA) 4 MG 24 hr capsule, Take 1 capsule (4 mg total) by mouth daily., Disp: 30 capsule, Rfl: 11 .  nitrofurantoin (MACRODANTIN) 50 MG capsule, Take 1 capsule (50 mg total) by mouth  in the morning and at bedtime for 7 days., Disp: 14 capsule, Rfl: 0   Allergies  Allergen Reactions  . Actonel  [Risedronate Sodium]   . Piper   . Pneumococcal Vaccine Other (See Comments)    WEAKNESS, DISORIENTED, "ARM SORE, RED, HURTING"  . Risedronate Other (See Comments)    REDNESS IN FACE  . Alendronate Rash  . Aleve [Naproxen Sodium] Rash  . Pravastatin Rash    ROS Review of Systems ROS   Review of Systems  Constitutional: Negative for chills, diaphoresis, fever and malaise/fatigue.  HENT: Negative for congestion, ear pain and sore throat.   Respiratory: Negative for cough, shortness of breath, wheezing Cardiovascular: Negative for chest pain, palpitations, peripheral edema, orthopnea, PND Gastrointestinal: Negative for abdominal pain, constipation, diarrhea, nausea and vomiting.  Genitourinary:  + for dysuria,- incontinence,+ hematuria  Musculoskeletal: Negative for myalgias. Negative for arthralgias. Skin: Negative for rashes or pruritus Neurological: Negative for dizziness, syncope, headaches, focal weakness, altered sensation  Psychiatric/Behavioral: Negative for depression and anxiety.       Objective:    Physical Exam Constitutional:      Appearance: She is normal weight.  HENT:     Nose: Nose normal.  Eyes:     Pupils: Pupils are equal, round, and reactive to light.  Cardiovascular:     Pulses: Normal pulses.     Heart sounds: No murmur heard.  No gallop.   Pulmonary:     Effort: Pulmonary effort is normal.  Abdominal:     General: Abdomen is flat.     Palpations: Abdomen is soft.  Musculoskeletal:     Cervical back: Tenderness present.  Neurological:     Mental Status: She is alert.       BP 127/68   Pulse 91   Ht 5' 2.5" (1.588 m)   Wt 122 lb 3.2 oz (55.4 kg)   BMI 21.99 kg/m  Wt Readings from Last 3 Encounters:  12/06/19 122 lb 3.2 oz (55.4 kg)  08/14/18 122 lb (55.3 kg)  06/19/18 119 lb 6.4 oz (54.2 kg)     Health Maintenance Due  Topic Date Due  . COVID-19 Vaccine (1) Never done  . TETANUS/TDAP  Never done  . PNA vac Low Risk Adult (2 of 2 - PCV13) 08/12/2011    There are no preventive care reminders to display for this patient.  No results found for: TSH Lab Results  Component Value Date   WBC 5.5 04/01/2018   HGB 12.6 04/01/2018   HCT 37.3 04/01/2018   MCV 101.9 (H) 04/01/2018   PLT 328 04/01/2018   Lab Results  Component Value Date   NA 139 04/01/2018   K 4.0 04/01/2018   CO2 26 04/01/2018   GLUCOSE 99 04/01/2018   BUN 16 04/01/2018   CREATININE 0.85 04/01/2018   BILITOT 0.3 02/25/2014   ALKPHOS 84 02/25/2014   AST 31 02/25/2014   ALT 22 02/25/2014   PROT 7.6 02/25/2014   ALBUMIN 3.5 02/25/2014   CALCIUM 9.0 04/01/2018   ANIONGAP 7 04/01/2018   No results found for: CHOL No results found for: HDL No results  found for: LDLCALC No results found for: TRIG No results found for: CHOLHDL No results found for: HGBA1C    Assessment & Plan:   Problem List Items Addressed This Visit      Nervous and Auditory   Cervical dystonia    Chronic problem        Other   Dizziness  present time patient is stable      Hyperlipidemia, unspecified    Follow diet for control of cholesterol       Other Visit Diagnoses    Acute cystitis without hematuria    -  Primary   Relevant Medications   nitrofurantoin (MACRODANTIN) 50 MG capsule   Painful urination       Relevant Orders   POCT urinalysis dipstick (Completed)      Meds ordered this encounter  Medications  . nitrofurantoin (MACRODANTIN) 50 MG capsule    Sig: Take 1 capsule (50 mg total) by mouth in the morning and at bedtime for 7 days.    Dispense:  14 capsule    Refill:  0   1. Painful urination  - POCT urinalysis dipstick  2. Acute cystitis without hematuria  - nitrofurantoin (MACRODANTIN) 50 MG capsule; Take 1 capsule (50 mg total) by mouth in the morning and at bedtime for 7 days.  Dispense: 14 capsule; Refill: 0  3. Cervical dystonia   4. Dizziness   5. Moderate mixed hyperlipidemia not requiring statin therapy  Follow-up: Return in about 4 weeks (around 01/03/2020).    Corky Downs, MD

## 2019-12-06 NOTE — Assessment & Plan Note (Signed)
present time patient is stable

## 2019-12-06 NOTE — Assessment & Plan Note (Signed)
Chronic problem. 

## 2019-12-06 NOTE — Assessment & Plan Note (Signed)
Follow diet for control of cholesterol

## 2019-12-31 ENCOUNTER — Ambulatory Visit: Payer: Medicare HMO | Admitting: Internal Medicine

## 2020-01-25 ENCOUNTER — Other Ambulatory Visit: Payer: Self-pay

## 2020-01-25 ENCOUNTER — Encounter: Payer: Self-pay | Admitting: Emergency Medicine

## 2020-01-25 ENCOUNTER — Emergency Department: Payer: Medicare HMO

## 2020-01-25 ENCOUNTER — Inpatient Hospital Stay
Admission: EM | Admit: 2020-01-25 | Discharge: 2020-02-01 | DRG: 065 | Disposition: A | Discharge status: Skilled Nursing Facility | Payer: Medicare HMO | Attending: Internal Medicine | Admitting: Internal Medicine

## 2020-01-25 DIAGNOSIS — G3184 Mild cognitive impairment, so stated: Secondary | ICD-10-CM

## 2020-01-25 DIAGNOSIS — Z79899 Other long term (current) drug therapy: Secondary | ICD-10-CM

## 2020-01-25 DIAGNOSIS — Z20822 Contact with and (suspected) exposure to covid-19: Secondary | ICD-10-CM | POA: Diagnosis present

## 2020-01-25 DIAGNOSIS — Z96659 Presence of unspecified artificial knee joint: Secondary | ICD-10-CM | POA: Diagnosis present

## 2020-01-25 DIAGNOSIS — Z886 Allergy status to analgesic agent status: Secondary | ICD-10-CM

## 2020-01-25 DIAGNOSIS — G243 Spasmodic torticollis: Secondary | ICD-10-CM | POA: Diagnosis present

## 2020-01-25 DIAGNOSIS — I10 Essential (primary) hypertension: Secondary | ICD-10-CM | POA: Diagnosis present

## 2020-01-25 DIAGNOSIS — I6322 Cerebral infarction due to unspecified occlusion or stenosis of basilar arteries: Principal | ICD-10-CM | POA: Diagnosis present

## 2020-01-25 DIAGNOSIS — R1310 Dysphagia, unspecified: Secondary | ICD-10-CM | POA: Diagnosis present

## 2020-01-25 DIAGNOSIS — G249 Dystonia, unspecified: Secondary | ICD-10-CM | POA: Diagnosis present

## 2020-01-25 DIAGNOSIS — I639 Cerebral infarction, unspecified: Secondary | ICD-10-CM | POA: Diagnosis present

## 2020-01-25 DIAGNOSIS — I951 Orthostatic hypotension: Secondary | ICD-10-CM | POA: Diagnosis present

## 2020-01-25 DIAGNOSIS — E7800 Pure hypercholesterolemia, unspecified: Secondary | ICD-10-CM | POA: Diagnosis present

## 2020-01-25 DIAGNOSIS — F0390 Unspecified dementia without behavioral disturbance: Secondary | ICD-10-CM | POA: Diagnosis present

## 2020-01-25 DIAGNOSIS — G8194 Hemiplegia, unspecified affecting left nondominant side: Secondary | ICD-10-CM | POA: Diagnosis present

## 2020-01-25 DIAGNOSIS — R29810 Facial weakness: Secondary | ICD-10-CM | POA: Diagnosis present

## 2020-01-25 DIAGNOSIS — Z888 Allergy status to other drugs, medicaments and biological substances status: Secondary | ICD-10-CM

## 2020-01-25 DIAGNOSIS — R4781 Slurred speech: Secondary | ICD-10-CM | POA: Diagnosis present

## 2020-01-25 DIAGNOSIS — R29703 NIHSS score 3: Secondary | ICD-10-CM | POA: Diagnosis present

## 2020-01-25 DIAGNOSIS — M810 Age-related osteoporosis without current pathological fracture: Secondary | ICD-10-CM | POA: Diagnosis present

## 2020-01-25 DIAGNOSIS — E785 Hyperlipidemia, unspecified: Secondary | ICD-10-CM | POA: Diagnosis present

## 2020-01-25 DIAGNOSIS — Z887 Allergy status to serum and vaccine status: Secondary | ICD-10-CM

## 2020-01-25 LAB — CBC
HCT: 36.1 % (ref 36.0–46.0)
Hemoglobin: 12.4 g/dL (ref 12.0–15.0)
MCH: 34.5 pg — ABNORMAL HIGH (ref 26.0–34.0)
MCHC: 34.3 g/dL (ref 30.0–36.0)
MCV: 100.6 fL — ABNORMAL HIGH (ref 80.0–100.0)
Platelets: 316 10*3/uL (ref 150–400)
RBC: 3.59 MIL/uL — ABNORMAL LOW (ref 3.87–5.11)
RDW: 13.2 % (ref 11.5–15.5)
WBC: 5.5 10*3/uL (ref 4.0–10.5)
nRBC: 0 % (ref 0.0–0.2)

## 2020-01-25 LAB — DIFFERENTIAL
Abs Immature Granulocytes: 0.01 10*3/uL (ref 0.00–0.07)
Basophils Absolute: 0.1 10*3/uL (ref 0.0–0.1)
Basophils Relative: 1 %
Eosinophils Absolute: 0.1 10*3/uL (ref 0.0–0.5)
Eosinophils Relative: 1 %
Immature Granulocytes: 0 %
Lymphocytes Relative: 27 %
Lymphs Abs: 1.5 10*3/uL (ref 0.7–4.0)
Monocytes Absolute: 0.4 10*3/uL (ref 0.1–1.0)
Monocytes Relative: 8 %
Neutro Abs: 3.5 10*3/uL (ref 1.7–7.7)
Neutrophils Relative %: 63 %

## 2020-01-25 LAB — COMPREHENSIVE METABOLIC PANEL
ALT: 14 U/L (ref 0–44)
AST: 21 U/L (ref 15–41)
Albumin: 4.2 g/dL (ref 3.5–5.0)
Alkaline Phosphatase: 72 U/L (ref 38–126)
Anion gap: 9 (ref 5–15)
BUN: 15 mg/dL (ref 8–23)
CO2: 24 mmol/L (ref 22–32)
Calcium: 9.1 mg/dL (ref 8.9–10.3)
Chloride: 105 mmol/L (ref 98–111)
Creatinine, Ser: 0.7 mg/dL (ref 0.44–1.00)
GFR calc Af Amer: >60 mL/min (ref >60)
GFR calc non Af Amer: >60 mL/min (ref >60)
Glucose, Bld: 97 mg/dL (ref 70–99)
Potassium: 4.3 mmol/L (ref 3.5–5.1)
Sodium: 138 mmol/L (ref 135–145)
Total Bilirubin: 0.9 mg/dL (ref 0.3–1.2)
Total Protein: 7.9 g/dL (ref 6.5–8.1)

## 2020-01-25 LAB — APTT: aPTT: 26 seconds (ref 24–36)

## 2020-01-25 LAB — PROTIME-INR
INR: 1 (ref 0.8–1.2)
Prothrombin Time: 12.8 seconds (ref 11.4–15.2)

## 2020-01-25 NOTE — ED Provider Notes (Signed)
[facility] Emergency Department Provider Note  ____________________________________________   I have reviewed the triage vital signs and the nursing notes.   HISTORY  Chief Complaint Weakness   History limited by and level 5 caveat due to: Dementia   HPI ROWEN HUR is a 86 y.o. female who presents to the emergency department today because of concerns for slurred speech, difficulty with walking and right hand numbness.  Patient does have history of dementia and her only complaint is hand numbness.  She was seen by EMS yesterday however refused transport at that time.  The patient denies any headache or chest pain.  Records reviewed. Per medical record review patient has a history of dementia.  Past Medical History:  Diagnosis Date  . Brain disorder 03/27/2014  . Cervical dystonia 01/24/2012  . Clinical depression 11/26/2015  . Dizziness 11/26/2015   Overview:  Chronic, felt secondary to inner ear per ENT evaluation approximately 1996.   . H/O total knee replacement 11/23/2015  . Headache, migraine 11/26/2015  . HLD (hyperlipidemia) 11/26/2015  . Hypercholesteremia   . Osteoporosis, post-menopausal 11/26/2015    Patient Active Problem List   Diagnosis Date Noted  . Depression 11/26/2015  . Dizziness 11/26/2015  . Hyperlipidemia, unspecified 11/26/2015  . Headache, migraine 11/26/2015  . Osteoporosis, post-menopausal 11/26/2015  . H/O total knee replacement 11/23/2015  . Arthropathy, traumatic, knee 03/05/2015  . Brain disorder 03/27/2014  . Cervical dystonia 01/24/2012    Past Surgical History:  Procedure Laterality Date  . ABDOMINAL HYSTERECTOMY    . ANKLE SURGERY Left   . KNEE SURGERY Right     Prior to Admission medications   Medication Sig Start Date End Date Taking? Authorizing Provider  clonazePAM (KLONOPIN) 0.5 MG tablet Take 0.5 mg by mouth 2 (two) times daily as needed.     [provider]  donepezil (ARICEPT) 10 MG tablet Take  by mouth. 02/03/15   [provider]  fesoterodine (TOVIAZ) 4 MG TB24 tablet Take 1 tablet (4 mg total) by mouth daily. 10/31/17   Alfredo Martinez, MD  mirabegron ER (MYRBETRIQ) 50 MG TB24 tablet Take 1 tablet (50 mg total) by mouth daily. 05/15/18   [name], MD  tolterodine (DETROL LA) 4 MG 24 hr capsule Take 1 capsule (4 mg total) by mouth daily. 08/14/18   Alfredo Martinez, MD    Allergies Actonel  [risedronate sodium], Piper, Pneumococcal vaccine, Risedronate, Alendronate, Aleve [naproxen sodium], and Pravastatin  Family History  Problem Relation Age of Onset  . Kidney cancer Neg Hx   . Hematuria Neg Hx     Social History Social History   Tobacco Use  . Smoking status: Never Smoker  . Smokeless tobacco: Never Used  Substance Use Topics  . Alcohol use: No  . Drug use: Not on file    Review of Systems Constitutional: No fever/chills Eyes: No visual changes. ENT: No sore throat. Cardiovascular: Denies chest pain. Respiratory: Denies shortness of breath. Gastrointestinal: No abdominal pain.  No nausea, no vomiting.  No diarrhea.   Genitourinary: Negative for dysuria. Musculoskeletal: Negative for back pain. Skin: Negative for rash. Neurological: Positive for right hand numbness ____________________________________________   PHYSICAL EXAM:  VITAL SIGNS: ED Triage Vitals  Enc Vitals Group     BP 01/25/20 1148 (!) 111/56     Pulse Rate 01/25/20 1148 65     Resp 01/25/20 1148 16     Temp 01/25/20 1148 98 F (36.7 C)     Temp Source 01/25/20  1148 Oral     SpO2 01/25/20 1148 98 %     Weight 01/25/20 1149 122 lb (55.3 kg)     Height 01/25/20 1149 5\' 3"  (1.6 m)     Head Circumference --      Peak Flow --      Pain Score 01/25/20 1148 0   Constitutional: Awake and alert. Not completely oriented to events. Eyes: Conjunctivae are normal.  ENT      Head: Normocephalic and atraumatic.      Nose: No congestion/rhinnorhea.      Mouth/Throat: Mucous  membranes are moist.      Neck: No stridor. Hematological/Lymphatic/Immunilogical: No cervical lymphadenopathy. Cardiovascular: Normal rate, regular rhythm.  No murmurs, rubs, or gallops.  Respiratory: Normal respiratory effort without tachypnea nor retractions. Breath sounds are clear and equal bilaterally. No wheezes/rales/rhonchi. Gastrointestinal: Soft and non tender. No rebound. No guarding.  Genitourinary: Deferred Musculoskeletal: Normal range of motion in all extremities. No lower extremity edema. Neurologic:  Dementia. No obvious slurred speech. Moving all extremities. Sensation appears to be intact over all finger pads to right hand. Strength 5/5 in upper and lower extremities. Skin:  Skin is warm, dry and intact. No rash noted. Psychiatric: Mood and affect are normal. Speech and behavior are normal. Patient exhibits appropriate insight and judgment.  ____________________________________________    LABS (pertinent positives/negatives)  CMP wnl CBC wbc 5.5, hgb 12.4, plt 316  ____________________________________________   EKG  I, 03/26/20, attending physician, personally viewed and interpreted this EKG  EKG Time: 1153 Rate: 63 Rhythm: normal sinus rhythm Axis: normal Intervals: qtc 454 QRS: narrow ST changes: no st elevation Impression: normal ekg  ____________________________________________    RADIOLOGY  CT head No acute intracranial findings  ____________________________________________   PROCEDURES  Procedures  ____________________________________________   INITIAL IMPRESSION / ASSESSMENT AND PLAN / ED COURSE  Pertinent labs & imaging results that were available during my care of the patient were reviewed by me and considered in my medical decision making (see chart for details).   Patient presents to the emergency department today with complaints of slurred speech, extremity weakness and right hand numbness.  On exam no obvious neurologic  deficits save for some dementia.  CT scan was performed which was negative.  However given complaints will obtain MRI to evaluate for acute stroke.  ___________________________________________   FINAL CLINICAL IMPRESSION(S) / ED DIAGNOSES  Right hand numbness  Note: This dictation was prepared with Dragon dictation. Any transcriptional errors that result from this process are unintentional     Phineas Semen, MD 01/25/20 2313

## 2020-01-25 NOTE — ED Notes (Addendum)
ED Provider at bedside.  Pt reports "not needing a cat scan now"at this time, pt c/o right hand numbness att and for a unknown time period, pt no pain or recent fever or illness, denies pain   CMS intact to to right hand

## 2020-01-25 NOTE — ED Notes (Signed)
To to MRI att

## 2020-01-25 NOTE — ED Triage Notes (Addendum)
Pt here for right hand numbness and difficulty walking.  Sx started 2 days ago.  Also c/o pain behind left eye.  Pt is alert and oriented at this time. VSS. Speech clear. NAD

## 2020-01-25 NOTE — ED Notes (Signed)
Gabriella Rogers, MRI on phone att

## 2020-01-25 NOTE — ED Notes (Signed)
Pt updated on wait times 

## 2020-01-25 NOTE — ED Notes (Signed)
[name],[name] Spouse [phone]    HIPAA compliant message left  Pt reports that daughter is Shon Hough and works in Endoscopy, no contact found for this person

## 2020-01-25 NOTE — ED Notes (Signed)
Husband at bedside reports that pt had right side weakness with inability to stand yesterday, s/sx resolved and pt refused EMS transport, this am prior to arrival pt then had similar s/sx with right side leaning and weakness; pt has recent hx with Dr Clelia Croft of short term memory loss (dementia)

## 2020-01-26 ENCOUNTER — Observation Stay
Admit: 2020-01-26 | Discharge: 2020-01-26 | Disposition: A | Discharge status: Home / Self Care | Payer: Medicare HMO | Attending: Internal Medicine | Admitting: Internal Medicine

## 2020-01-26 ENCOUNTER — Observation Stay: Payer: Medicare HMO

## 2020-01-26 ENCOUNTER — Encounter: Payer: Self-pay | Admitting: Internal Medicine

## 2020-01-26 DIAGNOSIS — Z888 Allergy status to other drugs, medicaments and biological substances status: Secondary | ICD-10-CM | POA: Diagnosis not present

## 2020-01-26 DIAGNOSIS — Z79899 Other long term (current) drug therapy: Secondary | ICD-10-CM | POA: Diagnosis not present

## 2020-01-26 DIAGNOSIS — G3184 Mild cognitive impairment, so stated: Secondary | ICD-10-CM

## 2020-01-26 DIAGNOSIS — G243 Spasmodic torticollis: Secondary | ICD-10-CM

## 2020-01-26 DIAGNOSIS — R29810 Facial weakness: Secondary | ICD-10-CM | POA: Diagnosis present

## 2020-01-26 DIAGNOSIS — Z886 Allergy status to analgesic agent status: Secondary | ICD-10-CM | POA: Diagnosis not present

## 2020-01-26 DIAGNOSIS — G8194 Hemiplegia, unspecified affecting left nondominant side: Secondary | ICD-10-CM | POA: Diagnosis present

## 2020-01-26 DIAGNOSIS — E785 Hyperlipidemia, unspecified: Secondary | ICD-10-CM | POA: Diagnosis present

## 2020-01-26 DIAGNOSIS — R1310 Dysphagia, unspecified: Secondary | ICD-10-CM | POA: Diagnosis present

## 2020-01-26 DIAGNOSIS — M810 Age-related osteoporosis without current pathological fracture: Secondary | ICD-10-CM | POA: Diagnosis present

## 2020-01-26 DIAGNOSIS — Z20822 Contact with and (suspected) exposure to covid-19: Secondary | ICD-10-CM | POA: Diagnosis present

## 2020-01-26 DIAGNOSIS — R29703 NIHSS score 3: Secondary | ICD-10-CM | POA: Diagnosis present

## 2020-01-26 DIAGNOSIS — G249 Dystonia, unspecified: Secondary | ICD-10-CM | POA: Diagnosis present

## 2020-01-26 DIAGNOSIS — R4781 Slurred speech: Secondary | ICD-10-CM | POA: Diagnosis present

## 2020-01-26 DIAGNOSIS — I639 Cerebral infarction, unspecified: Secondary | ICD-10-CM | POA: Diagnosis present

## 2020-01-26 DIAGNOSIS — Z96659 Presence of unspecified artificial knee joint: Secondary | ICD-10-CM | POA: Diagnosis present

## 2020-01-26 DIAGNOSIS — E7800 Pure hypercholesterolemia, unspecified: Secondary | ICD-10-CM | POA: Diagnosis present

## 2020-01-26 DIAGNOSIS — I10 Essential (primary) hypertension: Secondary | ICD-10-CM | POA: Diagnosis present

## 2020-01-26 DIAGNOSIS — I6322 Cerebral infarction due to unspecified occlusion or stenosis of basilar arteries: Secondary | ICD-10-CM | POA: Diagnosis present

## 2020-01-26 DIAGNOSIS — F0390 Unspecified dementia without behavioral disturbance: Secondary | ICD-10-CM | POA: Diagnosis present

## 2020-01-26 DIAGNOSIS — Z887 Allergy status to serum and vaccine status: Secondary | ICD-10-CM | POA: Diagnosis not present

## 2020-01-26 DIAGNOSIS — I951 Orthostatic hypotension: Secondary | ICD-10-CM | POA: Diagnosis present

## 2020-01-26 LAB — LIPID PANEL
Cholesterol: 274 mg/dL — ABNORMAL HIGH (ref 0–200)
HDL: 66 mg/dL (ref >40)
LDL Cholesterol: 195 mg/dL — ABNORMAL HIGH (ref 0–99)
Total CHOL/HDL Ratio: 4.2 RATIO
Triglycerides: 65 mg/dL (ref <150)
VLDL: 13 mg/dL (ref 0–40)

## 2020-01-26 LAB — SARS CORONAVIRUS 2 BY RT PCR (HOSPITAL ORDER, PERFORMED IN ~~LOC~~ HOSPITAL LAB): SARS Coronavirus 2: NEGATIVE

## 2020-01-26 LAB — HEMOGLOBIN A1C
Hgb A1c MFr Bld: 5.6 % (ref 4.8–5.6)
Mean Plasma Glucose: 114.02 mg/dL

## 2020-01-26 MED ORDER — STROKE: EARLY STAGES OF RECOVERY BOOK: Freq: Once | Status: DC

## 2020-01-26 MED ORDER — ASPIRIN 81 MG PO CHEW
324.0000 mg | CHEWABLE_TABLET | Freq: Once | ORAL | Status: AC
  Administered 2020-01-26: 324 mg via ORAL

## 2020-01-26 MED ORDER — ACETAMINOPHEN 325 MG PO TABS
650.0000 mg | ORAL_TABLET | ORAL | Status: DC | PRN
  Administered 2020-01-27 – 2020-01-31 (×5): 650 mg via ORAL
  Filled 2020-01-26 (×5): qty 2

## 2020-01-26 MED ORDER — ASPIRIN EC 81 MG PO TBEC
81.0000 mg | DELAYED_RELEASE_TABLET | Freq: Every day | ORAL | Status: DC
  Administered 2020-01-27: 81 mg via ORAL
  Filled 2020-01-26: qty 1

## 2020-01-26 MED ORDER — CLOPIDOGREL BISULFATE 75 MG PO TABS
300.0000 mg | ORAL_TABLET | Freq: Once | ORAL | Status: AC
  Administered 2020-01-26: 300 mg via ORAL
  Filled 2020-01-26: qty 4

## 2020-01-26 MED ORDER — ATORVASTATIN CALCIUM 20 MG PO TABS: 40.0000 mg | ORAL_TABLET | Freq: Every day | ORAL | Status: DC

## 2020-01-26 MED ORDER — CLONAZEPAM 0.5 MG PO TABS
0.5000 mg | ORAL_TABLET | Freq: Two times a day (BID) | ORAL | Status: DC | PRN
  Administered 2020-01-27 – 2020-01-31 (×7): 0.5 mg via ORAL
  Filled 2020-01-26 (×7): qty 1

## 2020-01-26 MED ORDER — LORAZEPAM 2 MG/ML IJ SOLN
INTRAMUSCULAR | Status: AC
  Administered 2020-01-26: 0.5 mg via INTRAVENOUS
  Filled 2020-01-26: qty 1

## 2020-01-26 MED ORDER — ATORVASTATIN CALCIUM 80 MG PO TABS
80.0000 mg | ORAL_TABLET | Freq: Every day | ORAL | Status: DC
  Administered 2020-01-26 – 2020-01-31 (×6): 80 mg via ORAL
  Filled 2020-01-26 (×7): qty 1

## 2020-01-26 MED ORDER — DONEPEZIL HCL 5 MG PO TABS
10.0000 mg | ORAL_TABLET | Freq: Every day | ORAL | Status: DC
  Administered 2020-01-26 – 2020-01-31 (×6): 10 mg via ORAL
  Filled 2020-01-26 (×8): qty 2

## 2020-01-26 MED ORDER — SODIUM CHLORIDE 0.9 % IV SOLN: INTRAVENOUS | Status: DC

## 2020-01-26 MED ORDER — IOHEXOL 350 MG/ML SOLN
75.0000 mL | Freq: Once | INTRAVENOUS | Status: AC | PRN
  Administered 2020-01-26: 75 mL via INTRAVENOUS

## 2020-01-26 MED ORDER — ACETAMINOPHEN 650 MG RE SUPP: 650.0000 mg | RECTAL | Status: DC | PRN

## 2020-01-26 MED ORDER — CLOPIDOGREL BISULFATE 75 MG PO TABS
75.0000 mg | ORAL_TABLET | Freq: Every day | ORAL | Status: DC
  Administered 2020-01-27 – 2020-02-01 (×6): 75 mg via ORAL
  Filled 2020-01-26 (×6): qty 1

## 2020-01-26 MED ORDER — ACETAMINOPHEN 160 MG/5ML PO SOLN
650.0000 mg | ORAL | Status: DC | PRN
  Filled 2020-01-26: qty 20.3

## 2020-01-26 MED ORDER — LORAZEPAM 2 MG/ML IJ SOLN: 0.5000 mg | Freq: Once | INTRAMUSCULAR | Status: AC

## 2020-01-26 NOTE — ED Notes (Signed)
Attempted to obtain 20 G IV via [place]. Vein blew.

## 2020-01-26 NOTE — ED Provider Notes (Signed)
MRI shows acute bilateral pontine CVA. ASA given. Will admit to medicine. Family and pt updated at bedside.   Shaune Pollack, MD 01/26/20 [phone]

## 2020-01-26 NOTE — ED Notes (Signed)
RN to bedside, pt noted to be trying to climb out of stretcher with bed alarm alarming. Pt redirected, repositioned in bed, blankets adjusted, stretcher locked, call bell in reach, bed alarm in place.

## 2020-01-26 NOTE — ED Notes (Signed)
Lab called to draw AM labs d/t patient being a hard stick and self removing previous IV.

## 2020-01-26 NOTE — ED Notes (Signed)
Pt resting at this time, NAD noted, stretcher locked in lowest position, call bell within reach, bed alarm on

## 2020-01-26 NOTE — Progress Notes (Signed)
*  PRELIMINARY RESULTS* Echocardiogram 2D Echocardiogram has been performed.  Gabriella Rogers 01/26/2020, 2:23 PM

## 2020-01-26 NOTE — ED Notes (Signed)
MD at bedside with husband on speaker phone trying to convince pt to go to CT. Pt states "this tests are making my stomach hurt and nervous and I'm cold."

## 2020-01-26 NOTE — Progress Notes (Signed)
PT Cancellation Note  Patient Details Name: INICE SANLUIS MRN: [id number] DOB: Feb 14, 1934   Cancelled Treatment:    Reason Eval/Treat Not Completed: Other (comment).  PT consult received.  Chart reviewed.  RN reports plan to perform COVID testing at this time.  Will re-attempt PT evaluation at a later date/time as able.  Hendricks Limes, PT 01/26/20, 4:05 PM

## 2020-01-26 NOTE — Progress Notes (Signed)
She is confused unable to provide any significant history.  She has underlying dementia.  Her daughter (healthcare power of attorney), was on speaker phone with the neurologist during my encounter with the patient.  Patient has bilateral pontine infarcts, high-grade mid basilar artery stenosis, high-grade proximal A2 right anterior cerebral artery stenosis and moderate stenosis within the distal M1 right middle cerebral artery.  She has been started on dual antiplatelet therapy, aspirin and Plavix for acute stroke.  Allow permissive hypertension.  Speech therapist recommended dysphagia three diet.  Continue current management.  Plan of care was discussed with neurologist, Dr. Derry Lory

## 2020-01-26 NOTE — Consult Note (Addendum)
NEUROLOGY CONSULTATION NOTE   Date of service: January 26, 2020 Patient Name: Gabriella Rogers MRN:  [id number] DOB:  02/18/1934 Reason for consult: "Acute pontine strokes BL"  History of Present Illness  Gabriella Rogers is a 86 y.o. female with PMH significant for mild cognitive impairment, cervical dystonia on botox and clonazepam, migraine, dizziness, depression, HLD, osteoporosis who presents with 2 day hx of R hand numbness and difficulty walking.  Patient declined to provide any history and refused examination today.  Hx obtained from husband on the phone. Husband reports that thursday mid afternoon, patient did not feel good. Went to lie down on the bed. When attempted to get, she could not get up from the bed. She seemed weak. Husband gave her walker but she couldnot support herself and could not seem to figure out how to put one foot in front of the other.  Husband called 911 on thursday afternoon after she was noted to have slurred speech. Husband had to carry her to the bathroom. She started to walk by the time EMS came in. Her SBP was elevated when EMS came in and slurred speech had resolved. Husband not sure how high the SBP was. EMS offered to take her to the ED but patient did not want to go to the ED. They suggested talking to primary care. Husband called PCP on Friday morning and was told by PCP office to take her to the ED. Husband brought her to the ED on Friday morning.  Workup [facility] with [facility] with no acute abnormalities, MRI Brain showed BL pontine infarcts.  Husband reports that daughter Ms. Gabriella Rogers is the HCPOA. Her phone number provided by the husband is [phone]). I attmepted to call her twice with no answer. I was eventually able to reach out to Ms. Gabriella Rogers and after multiple discussions with patient and her husband and her daughter, we were eventually able to convince patient to get the CT angios.  She does not take a baby aspirin at home. Husband not sure  if there is family hx of stroke in her parents or siblings.  NIHSS of 3 for sensory loss, did not know the month and mild facial palsy - mRS of 3 at baseline. - out of tPA window at presentation    ROS   Constitutional Denies weight loss, fever and chills.  HEENT Denies changes in vision and hearing.  Respiratory Denies SOB and cough.  CV Denies palpitations and CP  GI Denies abdominal pain, nausea, vomiting and diarrhea.  GU Denies dysuria and urinary frequency.  MSK Denies myalgia and joint pain.  Skin Denies rash and pruritus.  Neurological Denies headache and syncope.  Psychiatric Denies recent changes in mood. Denies anxiety and depression.   Past History   Past Medical History:  Diagnosis Date  . Brain disorder 03/27/2014  . Cervical dystonia 01/24/2012  . Clinical depression 11/26/2015  . Dizziness 11/26/2015   Overview:  Chronic, felt secondary to inner ear per ENT evaluation approximately 1996.   . H/O total knee replacement 11/23/2015  . Headache, migraine 11/26/2015  . HLD (hyperlipidemia) 11/26/2015  . Hypercholesteremia   . Osteoporosis, post-menopausal 11/26/2015   Past Surgical History:  Procedure Laterality Date  . ABDOMINAL HYSTERECTOMY    . ANKLE SURGERY Left   . KNEE SURGERY Right    Family History  Problem Relation Age of Onset  . Kidney cancer Neg Hx   . Hematuria Neg Hx    Social History  Socioeconomic History  . Marital status: Married    Spouse name: Not on file  . Number of children: Not on file  . Years of education: Not on file  . Highest education level: Not on file  Occupational History  . Not on file  Tobacco Use  . Smoking status: Never Smoker  . Smokeless tobacco: Never Used  Substance and Sexual Activity  . Alcohol use: No  . Drug use: Not on file  . Sexual activity: Not on file  Other Topics Concern  . Not on file  Social History Narrative  . Not on file   Social Determinants of Health   Financial Resource Strain:   .  Difficulty of Paying Living Expenses:   Food Insecurity:   . Worried About Programme researcher, broadcasting/film/video in the Last Year:   . Barista in the Last Year:   Transportation Needs:   . Freight forwarder (Medical):   Marland Kitchen Lack of Transportation (Non-Medical):   Physical Activity:   . Days of Exercise per Week:   . Minutes of Exercise per Session:   Stress:   . Feeling of Stress :   Social Connections:   . Frequency of Communication with Friends and Family:   . Frequency of Social Gatherings with Friends and Family:   . Attends Religious Services:   . Active Member of Clubs or Organizations:   . Attends Banker Meetings:   Marland Kitchen Marital Status:    Allergies  Allergen Reactions  . Actonel  [Risedronate Sodium]   . Piper   . Pneumococcal Vaccine Other (See Comments)    WEAKNESS, DISORIENTED, "ARM SORE, RED, HURTING"  . Risedronate Other (See Comments)    REDNESS IN FACE  . Alendronate Rash  . Aleve [Naproxen Sodium] Rash  . Pravastatin Rash    Medications   Current Facility-Administered Medications  Medication Dose Route Frequency Provider Last Rate Last Admin  .  stroke: mapping our early stages of recovery book   Does not apply Once Lindajo Royal V, MD      . 0.9 %  sodium chloride infusion   Intravenous Continuous Andris Baumann, MD   Paused at 01/26/20 [phone]  . acetaminophen (TYLENOL) tablet 650 mg  650 mg Oral Q4H PRN Andris Baumann, MD       Or  . acetaminophen (TYLENOL) 160 MG/5ML solution 650 mg  650 mg Per Tube Q4H PRN Andris Baumann, MD       Or  . acetaminophen (TYLENOL) suppository 650 mg  650 mg Rectal Q4H PRN Andris Baumann, MD      . aspirin EC tablet 81 mg  81 mg Oral Daily Lindajo Royal V, MD      . atorvastatin (LIPITOR) tablet 40 mg  40 mg Oral q1800 Andris Baumann, MD      . clonazePAM Scarlette Calico) tablet 0.5 mg  0.5 mg Oral BID PRN Lurene Shadow, MD      . donepezil (ARICEPT) tablet 10 mg  10 mg Oral QHS Lurene Shadow, MD       Current  Outpatient Medications  Medication Sig Dispense Refill Last Dose  . acetaminophen (TYLENOL) 325 MG tablet Take 650 mg by mouth every 6 (six) hours as needed.   prn at prn  . clonazePAM (KLONOPIN) 0.5 MG tablet Take 0.5 mg by mouth 2 (two) times daily as needed.    prn at prn  . donepezil (ARICEPT) 10 MG tablet Take by mouth.  01/24/2020 at 1930  . Multiple Vitamins-Minerals (ONE-A-DAY WOMENS PO) Take 1 tablet by mouth daily.   01/25/2020 at Unknown time     Vitals  Temp:  [98 F (36.7 C)-98.2 F (36.8 C)] 98.2 F (36.8 C) (08/07 0544) Pulse Rate:  [64-78] 78 (08/07 0544) Resp:  [16-18] 18 (08/07 0544) BP: (111-178)/(53-88) 144/87 (08/07 0544) SpO2:  [95 %-100 %] 99 % (08/07 0544) Weight:  [55.3 kg] 55.3 kg (08/06 1149)  Body mass index is 21.61 kg/m.  Physical Exam   REFUSED A FULL EXAM STATING THAT SHE IS TIRED AND JUSTS WANTS EVERYONE TO LEAVE HER ALONE.  General: Laying in bed; in no acute distress.  HENT: Normal external appearance of ears and nose. Neck: - CV: No noticeable JVD. No peripheral edema. Pulmonary: Symmetric Chest rise. Normal respiratory effort. No accessory muscle use noted. Abdomen: - Ext: No cyanosis, edema, or deformity  Skin: No rash. Musculoskeletal: No clubbing noted.  Neurologic Examination  Limited exam as patient declined to participate. Mental status/Cognition: Alert, oriented to self, conversant and refused to answer any more questions for me. Speech/language: Fluent, comprehension intact Cranial nerves:   CN II Pupils equal, tracks face on left and right   CN III,IV,VI Tracks my face.   CN V -   CN VII Mild L facial droop   CN VIII normal hearing to speech   CN IX & X    CN XI    CN XII Midline tongue protrusion   Motor:  Muscle bulk: appears decreased, normal tone. Bilateral upper extremities are 4+ out of 5. LLE is a 4-/5, RLE is a 4/5.  Reflexes:  Right Left Comments  Pectoralis      Biceps (C5/6)     Brachioradialis (C5/6) 2 2     Triceps (C6/7) 1 1    Patellar (L3/4) 1 1    Achilles (S1) 0 0    Hoffman      Plantar mute mute   Jaw jerk    Sensation: Can feel me touching her hands on both sides. Declined further evaluation.  Light touch Decreased in LLE and LUE to touch but normal in face.   Pin prick    Temperature    Vibration   Proprioception    Coordination/Complex Motor:  Ataxia noted on FTN with RUE worse than LUE.  Labs   Lab Results  Component Value Date   NA 138 01/25/2020   K 4.3 01/25/2020   CL 105 01/25/2020   CO2 24 01/25/2020   GLUCOSE 97 01/25/2020   BUN 15 01/25/2020   CREATININE 0.70 01/25/2020   CALCIUM 9.1 01/25/2020   ALBUMIN 4.2 01/25/2020   AST 21 01/25/2020   ALT 14 01/25/2020   ALKPHOS 72 01/25/2020   BILITOT 0.9 01/25/2020   GFRNONAA >60 01/25/2020   GFRAA >60 01/25/2020     Imaging and Diagnostic studies  MRI Brain without contrast: 1. Bilateral acute pontine infarcts, left slightly greater than right. 2. Findings of chronic ischemic microangiopathy.  CT Angio Head and Neck: 1. Acute infarcts within the pons bilaterally were better appreciated on the same-day brain MRI. 2. No interval infarct is identified. 3. Moderate generalized parenchymal atrophy and chronic small vessel ischemic disease.  Impression   TSUYAKO JOLLEY is a 86 y.o. female with PMH significant for with PMH significant for mild cognitive impairment, cervical dystonia on botox and clonazepam, migraine, dizziness, depression, HLD, osteoporosis who presents with 2 day hx of fluctuating R hand numbness, dysarthria and  difficulty walking.  MRI brain demonstrated acute bilateral pontine infarcts and CT angio with short segment high-grade basilar stenosis.  It was very difficult to get the patient to do anything for [place] including a neuro exam and it took [place] 3 hours and discussion of the multiple family members to convince her to get a cerebral angiogram of her head and neck.  This significantly  delayed the vessel imaging and I am still not entirely sure what deficit the patient has.  With my limited examination, I noted a mild right facial droop, left-sided numbness but no dysarthria, no significant arm or leg weakness but patient did not participate well with the strength exam.  I discussed in detail with patient's daughter Ms. Gabriella Hough about potential options including transfer to [facility] for angiogram and possible stenting versus keeping her here and doing medical management with dual antiplatelet therapy.  Taking into consideration her age, her decline over the last few years, personality changes, barely being able to walk, and multiple falls, Ms. Gabriella Hough discussed with her brothers and patient's husband and they came to a conclusion that they would go with medical management at this time.  Etiology of Stroke: Large artery atherosclerosis.  Recommendations   - I ordered Plavix 300 mg loading dose and continuing 75 mg daily.  I also ordered aspirin 325 mg daily.  She should be on this for 3 months and then Aspirin 325mg  only. - Follow-up with Dr. in stroke clinic. -Recommend permissive hypertension and treat SBP> 220 only and hold all home Antihypertensive medications for 48 hours.  Aim for gradual normotension after that. - LDL: 195, started on Atorvastatin 80mg  daily - A1C: pending - TTE: pending - Neuro checks Q2H - PT/OT/ SLP consults -   Ischemic Stroke Core Measures -NIHSS on admission 3 -Patient has been started on Mechanical (SCD's) and Pharmacological (SQ heparin/Lovenox) DVT prophylaxis. -Antiplatelet therapy has been initiated, Aspirin 325mg  daily and plavix 75mg  daily. -Patients LDL 195 and HgbA1c pending were checked and the patient will be discharged on Atorvastatin 80mg  daily. -Dysphagia screening ordered, and will be completed prior to patient receiving oral intake. -Stroke education booklet has been ordered and will be provided by the RN that  includes both written and verbal education to the patient and family regarding ischemic strokes. We have reviewed the patient's personal modifiable risk factors. -Patient is being assessed for Rehab by PT/OT/Speech and PM&R if indicated.  ______________________________________________________________________   This patient is critically ill and at significant risk of neurological worsening, death and care requires constant monitoring of vital signs, hemodynamics,respiratory and cardiac monitoring, neurological assessment, discussion with family, other specialists and medical decision making of high complexity. I spent 80 minutes of neurocritical care time  in the care of  this patient. This was time spent independent of any time provided by nurse practitioner or PA.  Thank you for the opportunity to take part in the care of this patient. If you have any further questions, please contact the neurology consultation attending.  Signed,  Pearlean Brownie [facility] Pager Number 

## 2020-01-26 NOTE — ED Notes (Signed)
CT tech tried to take pt for scan and pt refused. This RN has talked to pt twice about the need for further testing for her stroke and pt keeps repeating "my mother never had to have these tests done" and "it's too early for all these tests." pt informed its after 10am and that it's time to wake up and get this scan performed. Pt refused to CT tech until she saw "in writing the benefits of the test." Admitting MD and neurologist informed.

## 2020-01-26 NOTE — ED Notes (Signed)
Peri care provided, pt placed on purewick.

## 2020-01-26 NOTE — ED Notes (Signed)
Unable to assess pt modified NIH d/t pt not being cooperative. When asked pt name, age and the month, pt states "wouldn't you like to know". Pt asked to grip both of nurse's hands, pt had equal grip, RN requested that pt could let go, pt states "no, I want to hurt you"

## 2020-01-26 NOTE — ED Notes (Signed)
This RN still unable to assess modified NIH d/t pt not being cooperative

## 2020-01-26 NOTE — H&P (Signed)
History and Physical    Gabriella Rogers RXV:[id number] DOB: 11/05/1933 DOA: 01/25/2020  PCP: Corky Downs, MD   Patient coming from: Home  I have personally briefly reviewed patient's old medical records in [facility]  Chief Complaint: Numbness and tingling right arm, slurred speech  HPI: Gabriella Rogers is a 86 y.o. female with medical history significant for mild cognitive impairment, and cervical dystonia, followed by neurology, who presented to the emergency room with 2-day history of right hand numbness and difficulty walking.  EMS went to the home the day prior but patient refused to come to the emergency room.  The symptoms seem to abate but then recurred on the day of arrival and she this time agreed to be brought to the emergency room.  On arrival her main complaint was numbness in the right hand . ED Course: Vitals on arrival were unremarkable.  Blood work also unremarkable.  Head CT showed no acute findings however MRI brain showed bilateral acute pontine infarcts left slightly greater than right as well as chronic ischemic microangiopathy.  Patient was given aspirin, hospitalist consulted for admission.  Review of Systems: Unable to obtain as patient was very confused  Past Medical History:  Diagnosis Date  . Brain disorder 03/27/2014  . Cervical dystonia 01/24/2012  . Clinical depression 11/26/2015  . Dizziness 11/26/2015   Overview:  Chronic, felt secondary to inner ear per ENT evaluation approximately 1996.   . H/O total knee replacement 11/23/2015  . Headache, migraine 11/26/2015  . HLD (hyperlipidemia) 11/26/2015  . Hypercholesteremia   . Osteoporosis, post-menopausal 11/26/2015    Past Surgical History:  Procedure Laterality Date  . ABDOMINAL HYSTERECTOMY    . ANKLE SURGERY Left   . KNEE SURGERY Right      reports that she has never smoked. She has never used smokeless tobacco. She reports that she does not drink alcohol. No history on file for drug use.  Allergies   Allergen Reactions  . Actonel  [Risedronate Sodium]   . Piper   . Pneumococcal Vaccine Other (See Comments)    WEAKNESS, DISORIENTED, "ARM SORE, RED, HURTING"  . Risedronate Other (See Comments)    REDNESS IN FACE  . Alendronate Rash  . Aleve [Naproxen Sodium] Rash  . Pravastatin Rash    Family History  Problem Relation Age of Onset  . Kidney cancer Neg Hx   . Hematuria Neg Hx       Prior to Admission medications   Medication Sig Start Date End Date Taking? Authorizing Provider  clonazePAM (KLONOPIN) 0.5 MG tablet Take 0.5 mg by mouth 2 (two) times daily as needed.     [provider]  donepezil (ARICEPT) 10 MG tablet Take by mouth. 02/03/15   [provider]  fesoterodine (TOVIAZ) 4 MG TB24 tablet Take 1 tablet (4 mg total) by mouth daily. 10/31/17   Alfredo Martinez, MD  mirabegron ER (MYRBETRIQ) 50 MG TB24 tablet Take 1 tablet (50 mg total) by mouth daily. 05/15/18   [name], MD  tolterodine (DETROL LA) 4 MG 24 hr capsule Take 1 capsule (4 mg total) by mouth daily. 08/14/18   Alfredo Martinez, MD    Physical Exam: Vitals:   01/25/20 1148 01/25/20 1149 01/25/20 1613 01/25/20 1938  BP: (!) 111/56  (!) 149/75 (!) 135/53  Pulse: 65  67 70  Resp: 16   18  Temp: 98 F (36.7 C)     TempSrc: Oral     SpO2: 98%  100%  98%  Weight:  55.3 kg    Height:  5\' 3"  (1.6 m)       Vitals:   01/25/20 1148 01/25/20 1149 01/25/20 1613 01/25/20 1938  BP: (!) 111/56  (!) 149/75 (!) 135/53  Pulse: 65  67 70  Resp: 16   18  Temp: 98 F (36.7 C)     TempSrc: Oral     SpO2: 98%  100% 98%  Weight:  55.3 kg    Height:  5\' 3"  (1.6 m)        Constitutional: Alert , disoriented and confused did not know she was in the hospital. Not in any apparent distress HEENT:      Head: Normocephalic and atraumatic.         Eyes: PERLA, EOMI, Conjunctivae are normal. Sclera is non-icteric.       Mouth/Throat: Mucous membranes are moist.       Neck:  Taut muscles on right  side of neck .Supple with no signs of meningismus. Cardiovascular: Regular rate and rhythm. No murmurs, gallops, or rubs. 2+ symmetrical distal pulses are present . No JVD. No LE edema Respiratory: Respiratory effort normal .Lungs sounds clear bilaterally. No wheezes, crackles, or rhonchi.  Gastrointestinal: Soft, non tender, and non distended with positive bowel sounds. No rebound or guarding. Genitourinary: No CVA tenderness. Musculoskeletal: Nontender with normal range of motion in all extremities. No cyanosis, or erythema of extremities. Neurologic: Normal speech and language. Face is symmetric. Moving all extremities. No gross focal neurologic deficits . Skin: Skin is warm, dry.  No rash or ulcers Psychiatric: Patient appears anxious  Labs on Admission: I have personally reviewed following labs and imaging studies  CBC: Recent Labs  Lab 01/25/20 1152  WBC 5.5  NEUTROABS 3.5  HGB 12.4  HCT 36.1  MCV 100.6*  PLT 316   Basic Metabolic Panel: Recent Labs  Lab 01/25/20 1152  NA 138  K 4.3  CL 105  CO2 24  GLUCOSE 97  BUN 15  CREATININE 0.70  CALCIUM 9.1   GFR: Estimated Creatinine Clearance: 43.3 mL/min (by C-G formula based on SCr of 0.7 mg/dL). Liver Function Tests: Recent Labs  Lab 01/25/20 1152  AST 21  ALT 14  ALKPHOS 72  BILITOT 0.9  PROT 7.9  ALBUMIN 4.2   No results for input(s): LIPASE, AMYLASE in the last 168 hours. No results for input(s): AMMONIA in the last 168 hours. Coagulation Profile: Recent Labs  Lab 01/25/20 1152  INR 1.0   Cardiac Enzymes: No results for input(s): CKTOTAL, CKMB, CKMBINDEX, TROPONINI in the last 168 hours. BNP (last 3 results) No results for input(s): PROBNP in the last 8760 hours. HbA1C: No results for input(s): HGBA1C in the last 72 hours. CBG: No results for input(s): GLUCAP in the last 168 hours. Lipid Profile: No results for input(s): CHOL, HDL, LDLCALC, TRIG, CHOLHDL, LDLDIRECT in the last 72 hours. Thyroid  Function Tests: No results for input(s): TSH, T4TOTAL, FREET4, T3FREE, THYROIDAB in the last 72 hours. Anemia Panel: No results for input(s): VITAMINB12, FOLATE, FERRITIN, TIBC, IRON, RETICCTPCT in the last 72 hours. Urine analysis:    Component Value Date/Time   COLORURINE STRAW (A) 07/20/2016 2359   APPEARANCEUR Clear 10/31/2017 1420   LABSPEC 1.009 07/20/2016 2359   LABSPEC 1.012 02/25/2014 1511   PHURINE 6.0 07/20/2016 2359   GLUCOSEU Negative 10/31/2017 1420   GLUCOSEU Negative 02/25/2014 1511   HGBUR SMALL (A) 07/20/2016 2359   BILIRUBINUR Negative 12/06/2019 1543   BILIRUBINUR  Negative 10/31/2017 1420   BILIRUBINUR Negative 02/25/2014 1511   KETONESUR NEGATIVE 07/20/2016 2359   PROTEINUR Negative 12/06/2019 1543   PROTEINUR Trace (A) 10/31/2017 1420   PROTEINUR NEGATIVE 07/20/2016 2359   UROBILINOGEN >=8.0 (A) 12/06/2019 1543   NITRITE Positive 12/06/2019 1543   NITRITE Negative 10/31/2017 1420   NITRITE NEGATIVE 07/20/2016 2359   LEUKOCYTESUR Small (1+) (A) 12/06/2019 1543   LEUKOCYTESUR Negative 10/31/2017 1420   LEUKOCYTESUR Negative 02/25/2014 1511    Radiological Exams on Admission: CT HEAD WO CONTRAST  Result Date: 01/25/2020 CLINICAL DATA:  Stroke suspected.  Dizziness. EXAM: CT HEAD WITHOUT CONTRAST TECHNIQUE: Contiguous axial images were obtained from the base of the skull through the vertex without intravenous contrast. COMPARISON:  Head CT 07/21/2016 FINDINGS: Brain: No acute intracranial hemorrhage. No focal mass lesion. No CT evidence of acute infarction. No midline shift or mass effect. No hydrocephalus. Basilar cisterns are patent. There are periventricular and subcortical white matter hypodensities. Generalized cortical atrophy. Vascular: No hyperdense vessel or unexpected calcification. Skull: Normal. Negative for fracture or focal lesion. Sinuses/Orbits: Paranasal sinuses and mastoid air cells are clear. Orbits are clear. Other: None. IMPRESSION: 1. No acute  intracranial findings. 2. Atrophy and white matter microvascular disease. Electronically Signed   By: Genevive Bi M.D.   On: 01/25/2020 12:46   MR BRAIN WO CONTRAST  Result Date: 01/26/2020 CLINICAL DATA:  Right arm numbness and slurred speech EXAM: MRI HEAD WITHOUT CONTRAST TECHNIQUE: Multiplanar, multiecho pulse sequences of the brain and surrounding structures were obtained without intravenous contrast. COMPARISON:  07/22/2016 brain MRI FINDINGS: Brain: Bilateral pontine infarcts, left slightly greater than right. Multifocal white matter hyperintensity, most commonly due to chronic ischemic microangiopathy. Normal volume of CSF spaces. No chronic microhemorrhage. Normal midline structures. Vascular: Normal flow voids. Skull and upper cervical spine: Normal marrow signal. Sinuses/Orbits: Negative. Other: None. IMPRESSION: 1. Bilateral acute pontine infarcts, left slightly greater than right. 2. Findings of chronic ischemic microangiopathy. Electronically Signed   By: Deatra Robinson M.D.   On: 01/26/2020 00:34    EKG: Independently reviewed. Interpretation : Normal sinus rhythm  Assessment/Plan 86 year old female with history of mild cognitive impairment and cervical dystonia, followed by neurology presents to the emergency room with a 2-day history of numbness and tingling in the right upper extremity.  MRI showed bilateral acute pontine infarct    Acute CVA (cerebrovascular accident) [facility]) -Patient arrived outside TPA window with symptom onset 2 days prior to arrival.  Has persistent right upper extremity tingling and numbness with bilateral acute pontine infarct on MRI -Stroke work-up to include carotid Doppler, echocardiogram, cardiac monitoring -Aspirin and statin -Neurology consult -PT OT and speech consult    Cervical dystonia -Continue home clonazepam pending med rec    Mild cognitive impairment -Continue home donezepil    DVT prophylaxis: SCDs Code Status: full code  Family  Communication:  none  Disposition Plan: Back to previous home environment Consults called: Neurology Status: Observation    Andris Baumann MD [facility]     01/26/2020, 1:35 AM

## 2020-01-26 NOTE — Progress Notes (Signed)
SLP Cancellation Note  Patient Details Name: Gabriella Rogers MRN: [id number] DOB: 1933/12/16   Cancelled treatment:       Reason Eval/Treat Not Completed: Patient declined, no reason specified (chart reviewed; consulted NSG and Neurology re: pt) Pt NSG, pt passed her Yale swallow screen and has taken sips, bites adequately; NSG to have diet ordered by MD. Per chart notes, pt has a Baseline of Dementia but unsure of PLOF at home w/ Husband. She currently has dx of new stroke. She reportedly has not slept and continues to request to be "left alone; I just want to sleep". Speech was Clear; she declined offer of drink and foods w/ SLP this AM. She has not been agreeable to tests per Neurology.  ST services will hold on any Cognitive-linguistic assessment at this time while pt remains in the ED, mildly disagreeable. Would suspect any Baseline Cognitive decline/Dementia could be exacerbated w/ change of environment, lack of sleep, and new stroke. Recommend offering of verbal cues for follow through w/ tasks, reducing distractions in room during engagements.  ST services will f/u on Monday to determine if any acute needs. NSG/MD agreed.    Jerilynn Som, MS, CCC-SLP Evangelene Vora 01/26/2020, 10:19 AM

## 2020-01-26 NOTE — ED Notes (Signed)
Pt eating meal tray at this time

## 2020-01-26 NOTE — ED Notes (Addendum)
Pt assisted to toilet by this RN. Pt used front wheel walker. Pt stated her hips were week and leaned onto wall with shoulder. This RN assisted pt to toilet to prevent fall, no injury to pt occurred.  Pt walked back to bed safely by this RN. Call bell within reach.

## 2020-01-26 NOTE — ED Notes (Signed)
Pt provided with snack per request, pt passed swallow screen prior to eating.  unsuccessful IV attempt x2 by this RN. Will request IV team consult.

## 2020-01-26 NOTE — ED Notes (Signed)
Dining services contacted for tray

## 2020-01-26 NOTE — Progress Notes (Signed)
OT Cancellation Note  Patient Details Name: Gabriella Rogers MRN: [id number] DOB: [birth year]   Cancelled Treatment:    Reason Eval/Treat Not Completed: Patient at procedure or test/ unavailable;Other (comment). Order received and chart reviewed. Upon arrival to pt room, RN in room, voices plan to perform COVID testing. Will hold OT evaluation at this time and continue to follow remotely and initiate OT services as available and pt medically appropriate.   Rockney Ghee, M.S., OTR/L Ascom: [phone] 01/26/20, 3:55 PM

## 2020-01-27 DIAGNOSIS — G3184 Mild cognitive impairment, so stated: Secondary | ICD-10-CM

## 2020-01-27 LAB — ECHOCARDIOGRAM COMPLETE
AR max vel: 1.84 cm2
AV Peak grad: 6.4 mmHg
Ao pk vel: 1.26 m/s
Area-P 1/2: 3.93 cm2
Height: 63 in
S' Lateral: 2.47 cm
Weight: 1952 oz

## 2020-01-27 MED ORDER — ENOXAPARIN SODIUM 40 MG/0.4ML ~~LOC~~ SOLN
40.0000 mg | Freq: Every day | SUBCUTANEOUS | Status: DC
  Administered 2020-01-27 – 2020-02-01 (×5): 40 mg via SUBCUTANEOUS
  Filled 2020-01-27 (×6): qty 0.4

## 2020-01-27 MED ORDER — ASPIRIN 325 MG PO TABS
325.0000 mg | ORAL_TABLET | Freq: Every day | ORAL | Status: DC
  Administered 2020-01-28 – 2020-02-01 (×5): 325 mg via ORAL
  Filled 2020-01-27 (×5): qty 1

## 2020-01-27 MED ORDER — SODIUM CHLORIDE 0.9 % IV BOLUS: 500.0000 mL | Freq: Once | INTRAVENOUS | Status: DC

## 2020-01-27 MED ORDER — ASPIRIN 81 MG PO CHEW
243.0000 mg | CHEWABLE_TABLET | Freq: Once | ORAL | Status: AC
  Administered 2020-01-27: 243 mg via ORAL
  Filled 2020-01-27: qty 3

## 2020-01-27 NOTE — TOC Initial Note (Signed)
Transition of Care [facility]) - Initial/Assessment Note    Patient Details  Name: Gabriella Rogers MRN: [id number] Date of Birth: [birth year]  [facility]) CM/SW Contact:    Gabriella Rogers, [place] Phone Number: [phone] 01/27/2020, 3:31 PM  Clinical Narrative:                 Patient is a 86 year old female who presented to the emergency room with 2-day history of right hand numbness and difficulty walking.  Patient resides with her spouse-Gabriella Rogers. Patient's daughter-Gabriella Rogers is patient's HCPOA [phone]. Patient states that she has a cain, walker and grab bar in the bathroom at home. Per patient she uses [name]. Patient's primary care doctor is Dr. Corky Rogers at [facility]. Pateint reports having transportation to medical appointments-usually by spouse.  Patient's two son's Gabriella Rogers and Gabriella Rogers present during the visit. Patient describes a very supportive family system. Rehab recommendation discussed, patient agrees with recommendation. Facility care process discussed. FL2 to be completed and faxed out to local facilities. Bed offers to be reviewed with patient and her family once received.  Expected Discharge Plan: Skilled Nursing Facility Barriers to Discharge: No Barriers Identified   Patient Goals and CMS Choice Patient states their goals for this hospitalization and ongoing recovery are:: "I would like to get up to do all the things I used to do without using a walker or cain"      Expected Discharge Plan and Services Expected Discharge Plan: Skilled Nursing Facility In-house Referral: Clinical Social Work     Living arrangements for the past 2 months: Single Family Home                                      Prior Living Arrangements/Services Living arrangements for the past 2 months: Single Family Home Lives with:: Spouse Patient language and need for interpreter reviewed:: Yes Do you feel safe going back to  the place where you live?: Yes      Need for Family Participation in Patient Care: Yes (Comment) Care giver support system in place?: Yes (comment)   Criminal Activity/Legal Involvement Pertinent to Current Situation/Hospitalization: No - Comment as needed  Activities of Daily Living Home Assistive Devices/Equipment: Dentures (specify type), Eyeglasses, Walker (specify type) ADL Screening (condition at time of admission) Patient's cognitive ability adequate to safely complete daily activities?: Yes Is the patient deaf or have difficulty hearing?: Yes Does the patient have difficulty seeing, even when wearing glasses/contacts?: No Does the patient have difficulty concentrating, remembering, or making decisions?: Yes Patient able to express need for assistance with ADLs?: No Does the patient have difficulty dressing or bathing?: Yes Independently performs ADLs?: Yes (appropriate for developmental age) Does the patient have difficulty walking or climbing stairs?: Yes Weakness of Legs: None Weakness of Arms/Hands: Right  Permission Sought/Granted            Permission granted to share info w Relationship: Gabriella Rogers  Permission granted to share info w Contact Information: [phone]  Emotional Assessment Appearance:: Appears younger than stated age   Affect (typically observed): Accepting, Adaptable Orientation: : Oriented to Self, Oriented to Place, Oriented to  Time, Oriented to Situation Alcohol / Substance Use: Not Applicable Psych Involvement: No (comment)  Admission diagnosis:  Stroke (cerebrum) (HCC) [I63.9] Acute CVA (cerebrovascular accident) [facility]) [I63.9] Patient Active Problem List   Diagnosis Date Noted  .  Acute CVA (cerebrovascular accident) (HCC) 01/26/2020  . Mild cognitive impairment 01/26/2020  . Depression 11/26/2015  . Dizziness 11/26/2015  . Hyperlipidemia, unspecified 11/26/2015  . Headache, migraine 11/26/2015  . Osteoporosis, post-menopausal  11/26/2015  . H/O total knee replacement 11/23/2015  . Arthropathy, traumatic, knee 03/05/2015  . Brain disorder 03/27/2014  . Cervical dystonia 01/24/2012   PCP:  Gabriella Downs, MD Pharmacy:   [facility] - [place], [place] - [address] [place] [address] Phone: [phone] Fax: [phone]     Social Determinants of Health (SDOH) Interventions    Readmission Risk Interventions No flowsheet data found.

## 2020-01-27 NOTE — Progress Notes (Addendum)
Progress Note    Gabriella Rogers  XBM:[id number] DOB: 02-08-1934  DOA: 01/25/2020 PCP: Corky Downs, MD      Brief Narrative:    Medical records reviewed and are as summarized below:  Gabriella Rogers is a 86 y.o. female       Assessment/Plan:   Principal Problem:   Acute CVA (cerebrovascular accident) (HCC) Active Problems:   Cervical dystonia   Mild cognitive impairment   Acute bilateral pontine stroke High-grade mid basilar artery stenosis, high-grade proximal A2 right anterior cerebral artery stenosis and moderate stenosis within the distal M1 right middle cerebral artery Orthostatic hypotension Hyperlipidemia (total cholesterol 274, LDL 195, HDL 66, triglycerides 65) Hypertension Mild cognitive dysfunction   PLAN  2D echo is pending.  Hemoglobin A1c is 5.6. Continue home aspirin and Plavix for 3 months and switch to aspirin monotherapy Continue Lipitor Continue IV fluids to keep blood pressure on the high side Allow for permissive hypertension and consider fluid bolus if BP drops significantly Repeat CT head and CTA head if there is significant change in mental status (this was discussed with neurologist, Dr. Derry Lory). Per neurologist, patient's daughter has opted for medical management rather than surgical treatment or stent placement for high-grade basilar artery stenosis Continue Aricept  PT recommends discharge to SNF.   Body mass index is 21.1 kg/m.  Diet Order            DIET DYS 3 Room service appropriate? Yes; Fluid consistency: Thin  Diet effective now                       Medications:   .  stroke: mapping our early stages of recovery book   Does not apply Once  . aspirin EC  81 mg Oral Daily  . atorvastatin  80 mg Oral q1800  . clopidogrel  75 mg Oral Daily  . donepezil  10 mg Oral QHS  . enoxaparin (LOVENOX) injection  40 mg Subcutaneous Daily   Continuous Infusions: . sodium chloride 75 mL/hr at 01/26/20 2049  .  sodium chloride       Anti-infectives (From admission, onward)   None             Family Communication/Anticipated D/C date and plan/Code Status   DVT prophylaxis: enoxaparin (LOVENOX) injection 40 mg Start: 01/27/20 1200 SCD's Start: 01/26/20 0132     Code Status: Full Code  Family Communication: Plan discussed with patient Disposition Plan:    Status is: Inpatient  Remains inpatient appropriate because:Unsafe d/c plan and Inpatient level of care appropriate due to severity of illness   Dispo: The patient is from: Home              Anticipated d/c is to: SNF              Anticipated d/c date is: 1 day              Patient currently is not medically stable to d/c.           Subjective:   She has no complaints.  Overnight events noted.  She has had some intermittent confusion.  BP dropped from 174/68-139/60 while she was working with PT.   Objective:    Vitals:   01/27/20 0635 01/27/20 0824 01/27/20 1122 01/27/20 1316  BP: (!) 166/90 (!) 157/86  123/88  Pulse: 78 71 71   Resp: 18 16 17    Temp: 97.6 F (36.4 C) 97.7  F (36.5 C) 98.3 F (36.8 C)   TempSrc: Oral Oral    SpO2: 98% 100% 98%   Weight: 54 kg     Height:       No data found.   Intake/Output Summary (Last 24 hours) at 01/27/2020 1338 Last data filed at 01/27/2020 0640 Gross per 24 hour  Intake 240 ml  Output 100 ml  Net 140 ml   Filed Weights   01/25/20 1149 01/26/20 1958 01/27/20 0635  Weight: 55.3 kg 53.6 kg 54 kg    Exam:  GEN: NAD SKIN: No rash EYES: EOMI ENT: MMM CV: RRR PULM: CTA B ABD: soft, ND, NT, +BS CNS: AAO x 3, mild left facial droop, mild left-sided weakness (power 4/5) EXT: No edema or tenderness   Data Reviewed:   I have personally reviewed following labs and imaging studies:  Labs: Labs show the following:   Basic Metabolic Panel: Recent Labs  Lab 01/25/20 1152  NA 138  K 4.3  CL 105  CO2 24  GLUCOSE 97  BUN 15  CREATININE 0.70   CALCIUM 9.1   GFR Estimated Creatinine Clearance: 43.3 mL/min (by C-G formula based on SCr of 0.7 mg/dL). Liver Function Tests: Recent Labs  Lab 01/25/20 1152  AST 21  ALT 14  ALKPHOS 72  BILITOT 0.9  PROT 7.9  ALBUMIN 4.2   No results for input(s): LIPASE, AMYLASE in the last 168 hours. No results for input(s): AMMONIA in the last 168 hours. Coagulation profile Recent Labs  Lab 01/25/20 1152  INR 1.0    CBC: Recent Labs  Lab 01/25/20 1152  WBC 5.5  NEUTROABS 3.5  HGB 12.4  HCT 36.1  MCV 100.6*  PLT 316   Cardiac Enzymes: No results for input(s): CKTOTAL, CKMB, CKMBINDEX, TROPONINI in the last 168 hours. BNP (last 3 results) No results for input(s): PROBNP in the last 8760 hours. CBG: No results for input(s): GLUCAP in the last 168 hours. D-Dimer: No results for input(s): DDIMER in the last 72 hours. Hgb A1c: Recent Labs    01/26/20 0956  HGBA1C 5.6   Lipid Profile: Recent Labs    01/26/20 0956  CHOL 274*  HDL 66  LDLCALC 195*  TRIG 65  CHOLHDL 4.2   Thyroid function studies: No results for input(s): TSH, T4TOTAL, T3FREE, THYROIDAB in the last 72 hours.  Invalid input(s): FREET3 Anemia work up: No results for input(s): VITAMINB12, FOLATE, FERRITIN, TIBC, IRON, RETICCTPCT in the last 72 hours. Sepsis Labs: Recent Labs  Lab 01/25/20 1152  WBC 5.5    Microbiology Recent Results (from the past 240 hour(s))  SARS Coronavirus 2 by RT PCR (hospital order, performed in [facility] hospital lab) Nasopharyngeal Nasopharyngeal Swab     Status: None   Collection Time: 01/26/20  3:50 PM   Specimen: Nasopharyngeal Swab  Result Value Ref Range Status   SARS Coronavirus 2 NEGATIVE NEGATIVE Final    Comment: (NOTE) SARS-CoV-2 target nucleic acids are NOT DETECTED.  The SARS-CoV-2 RNA is generally detectable in upper and lower respiratory specimens during the acute phase of infection. The lowest concentration of SARS-CoV-2 viral copies this assay  can detect is 250 copies / mL. A negative result does not preclude SARS-CoV-2 infection and should not be used as the sole basis for treatment or other patient management decisions.  A negative result may occur with improper specimen collection / handling, submission of specimen other than nasopharyngeal swab, presence of viral mutation(s) within the areas targeted by this  assay, and inadequate number of viral copies (<250 copies / mL). A negative result must be combined with clinical observations, patient history, and epidemiological information.  Fact Sheet for Patients:   BoilerBrush.com.cy  Fact Sheet for Healthcare Providers: https://pope.com/  This test is not yet approved or  cleared by the [place] FDA and has been authorized for detection and/or diagnosis of SARS-CoV-2 by FDA under an Emergency Use Authorization (EUA).  This EUA will remain in effect (meaning this test can be used) for the duration of the COVID-19 declaration under Section 564(b)(1) of the Act, 21 U.S.C. section 360bbb-3(b)(1), unless the authorization is terminated or revoked sooner.  Performed at [facility], [address] [place], [place] [address]     Procedures and diagnostic studies:  CT ANGIO HEAD W OR WO CONTRAST  Result Date: 01/26/2020 CLINICAL DATA:  Stroke/TIA, assess extracranial arteries; posterior circulation infarcts, rule out basilar thrombus. EXAM: CT ANGIOGRAPHY HEAD AND NECK TECHNIQUE: Multidetector CT imaging of the head and neck was performed using the standard protocol during bolus administration of intravenous contrast. Multiplanar CT image reconstructions and MIPs were obtained to evaluate the vascular anatomy. Carotid stenosis measurements (when applicable) are obtained utilizing NASCET criteria, using the distal internal carotid diameter as the denominator. CONTRAST:  76mL OMNIPAQUE IOHEXOL 350 MG/ML SOLN COMPARISON:   MRI head 01/26/2020, head CT 01/25/2020. FINDINGS: CT HEAD FINDINGS Brain: Moderate generalized parenchymal atrophy. No acute infarcts within the pons bilaterally were better appreciated on the MRI performed earlier the same day. Moderate patchy hypoattenuation within the cerebral white matter is nonspecific, but consistent with chronic small vessel ischemic disease. There is no acute intracranial hemorrhage. No demarcated cortical infarct is identified. No extra-axial fluid collection. No evidence of intracranial mass. No midline shift. Vascular: Atherosclerotic calcifications. Skull: Normal. Negative for fracture or focal lesion. Sinuses: No significant paranasal sinus disease or mastoid effusion. Orbits: No acute abnormality. Review of the MIP images confirms the above findings CTA NECK FINDINGS Aortic arch: Standard aortic branching. Atherosclerotic plaque within the visualized aortic arch and proximal major branch vessels of the neck. Atherosclerotic stenosis of the proximal right subclavian artery likely exceed 50%. Right carotid system: CCA and ICA patent within the neck without significant stenosis (50% or greater). Mild atherosclerotic plaque within the carotid bifurcation Left carotid system: CCA and ICA patent within the neck without significant stenosis (50% or greater). Mild atherosclerotic plaque within the carotid bifurcation. Vertebral arteries: The right vertebral artery is developmentally diminutive but patent throughout the neck. The dominant left vertebral artery is patent throughout the neck without significant stenosis. Skeleton: No acute bony abnormality or aggressive osseous lesion. Cervical spondylosis Other neck: No neck mass or cervical lymphadenopathy. Upper chest: Scarring within the right lung apex. Review of the MIP images confirms the above findings CTA HEAD FINDINGS Anterior circulation: The intracranial internal carotid arteries are patent. Mild nonstenotic plaque within both  vessels. The M1 middle cerebral arteries are patent. Moderate stenosis of the distal M1 right middle cerebral artery (series 15, image 19). No M2 proximal branch occlusion is identified. The anterior cerebral arteries are patent. Atherosclerotic irregularity of the anterior cerebral arteries bilaterally. Most notably, there is a high-grade stenosis within the proximal A2 right ACA. No intracranial aneurysm is identified. Posterior circulation: The non dominant intracranial right vertebral artery is developmentally diminutive but patent, and terminates as the right PICA. The dominant intracranial left vertebral artery is patent without significant stenosis. There is a high-grade focal stenosis within the mid basilar artery.  There are fetal origin posterior cerebral arteries bilaterally. The posterior cerebral arteries are patent bilaterally without high-grade proximal stenosis. Venous sinuses: Within limitations of contrast timing, no convincing thrombus. Anatomic variants: As described Review of the MIP images confirms the above findings High-grade focal stenosis within the mid basilar artery. These findings were called by telephone at the time of interpretation on 01/26/2020 at 11:55 am to provider [facility] , who verbally acknowledged these results. These results will be called to the ordering clinician or representative by the Radiologist Assistant, and communication documented in the PACS or [employer]. IMPRESSION: CT head: 1. Acute infarcts within the pons bilaterally were better appreciated on the same-day brain MRI. 2. No interval infarct is identified. 3. Moderate generalized parenchymal atrophy and chronic small vessel ischemic disease. CTA neck: The common carotid, internal carotid and vertebral arteries are patent within the neck without hemodynamically significant stenosis. CTA head: 1. High-grade focal stenosis within the mid basilar artery. 2. Moderate stenosis within the distal M1 right  middle cerebral artery. 3. High-grade focal stenosis within the proximal A2 right anterior cerebral artery. Electronically Signed   By: Jackey Loge DO   On: 01/26/2020 12:11   CT ANGIO NECK W OR WO CONTRAST  Result Date: 01/26/2020 CLINICAL DATA:  Stroke/TIA, assess extracranial arteries; posterior circulation infarcts, rule out basilar thrombus. EXAM: CT ANGIOGRAPHY HEAD AND NECK TECHNIQUE: Multidetector CT imaging of the head and neck was performed using the standard protocol during bolus administration of intravenous contrast. Multiplanar CT image reconstructions and MIPs were obtained to evaluate the vascular anatomy. Carotid stenosis measurements (when applicable) are obtained utilizing NASCET criteria, using the distal internal carotid diameter as the denominator. CONTRAST:  75mL OMNIPAQUE IOHEXOL 350 MG/ML SOLN COMPARISON:  MRI head 01/26/2020, head CT 01/25/2020. FINDINGS: CT HEAD FINDINGS Brain: Moderate generalized parenchymal atrophy. No acute infarcts within the pons bilaterally were better appreciated on the MRI performed earlier the same day. Moderate patchy hypoattenuation within the cerebral white matter is nonspecific, but consistent with chronic small vessel ischemic disease. There is no acute intracranial hemorrhage. No demarcated cortical infarct is identified. No extra-axial fluid collection. No evidence of intracranial mass. No midline shift. Vascular: Atherosclerotic calcifications. Skull: Normal. Negative for fracture or focal lesion. Sinuses: No significant paranasal sinus disease or mastoid effusion. Orbits: No acute abnormality. Review of the MIP images confirms the above findings CTA NECK FINDINGS Aortic arch: Standard aortic branching. Atherosclerotic plaque within the visualized aortic arch and proximal major branch vessels of the neck. Atherosclerotic stenosis of the proximal right subclavian artery likely exceed 50%. Right carotid system: CCA and ICA patent within the neck without  significant stenosis (50% or greater). Mild atherosclerotic plaque within the carotid bifurcation Left carotid system: CCA and ICA patent within the neck without significant stenosis (50% or greater). Mild atherosclerotic plaque within the carotid bifurcation. Vertebral arteries: The right vertebral artery is developmentally diminutive but patent throughout the neck. The dominant left vertebral artery is patent throughout the neck without significant stenosis. Skeleton: No acute bony abnormality or aggressive osseous lesion. Cervical spondylosis Other neck: No neck mass or cervical lymphadenopathy. Upper chest: Scarring within the right lung apex. Review of the MIP images confirms the above findings CTA HEAD FINDINGS Anterior circulation: The intracranial internal carotid arteries are patent. Mild nonstenotic plaque within both vessels. The M1 middle cerebral arteries are patent. Moderate stenosis of the distal M1 right middle cerebral artery (series 15, image 19). No M2 proximal branch occlusion is identified. The anterior cerebral  arteries are patent. Atherosclerotic irregularity of the anterior cerebral arteries bilaterally. Most notably, there is a high-grade stenosis within the proximal A2 right ACA. No intracranial aneurysm is identified. Posterior circulation: The non dominant intracranial right vertebral artery is developmentally diminutive but patent, and terminates as the right PICA. The dominant intracranial left vertebral artery is patent without significant stenosis. There is a high-grade focal stenosis within the mid basilar artery. There are fetal origin posterior cerebral arteries bilaterally. The posterior cerebral arteries are patent bilaterally without high-grade proximal stenosis. Venous sinuses: Within limitations of contrast timing, no convincing thrombus. Anatomic variants: As described Review of the MIP images confirms the above findings High-grade focal stenosis within the mid basilar  artery. These findings were called by telephone at the time of interpretation on 01/26/2020 at 11:55 am to [facility] , who verbally acknowledged these results. These results will be called to the ordering clinician or representative by the Radiologist Assistant, and communication documented in the PACS or [employer]. IMPRESSION: CT head: 1. Acute infarcts within the pons bilaterally were better appreciated on the same-day brain MRI. 2. No interval infarct is identified. 3. Moderate generalized parenchymal atrophy and chronic small vessel ischemic disease. CTA neck: The common carotid, internal carotid and vertebral arteries are patent within the neck without hemodynamically significant stenosis. CTA head: 1. High-grade focal stenosis within the mid basilar artery. 2. Moderate stenosis within the distal M1 right middle cerebral artery. 3. High-grade focal stenosis within the proximal A2 right anterior cerebral artery. Electronically Signed   By: Jackey LogeKyle  Golden DO   On: 01/26/2020 12:11   MR BRAIN WO CONTRAST  Result Date: 01/26/2020 CLINICAL DATA:  Right arm numbness and slurred speech EXAM: MRI HEAD WITHOUT CONTRAST TECHNIQUE: Multiplanar, multiecho pulse sequences of the brain and surrounding structures were obtained without intravenous contrast. COMPARISON:  07/22/2016 brain MRI FINDINGS: Brain: Bilateral pontine infarcts, left slightly greater than right. Multifocal white matter hyperintensity, most commonly due to chronic ischemic microangiopathy. Normal volume of CSF spaces. No chronic microhemorrhage. Normal midline structures. Vascular: Normal flow voids. Skull and upper cervical spine: Normal marrow signal. Sinuses/Orbits: Negative. Other: None. IMPRESSION: 1. Bilateral acute pontine infarcts, left slightly greater than right. 2. Findings of chronic ischemic microangiopathy. Electronically Signed   By: Deatra RobinsonKevin  Herman M.D.   On: 01/26/2020 00:34   [facility])  Result Date: 01/26/2020 CLINICAL DATA:  Slurred speech, difficulty walking and right hand weakness. History of hyperlipidemia. EXAM: BILATERAL CAROTID DUPLEX ULTRASOUND TECHNIQUE: Wallace CullensGray scale imaging, color Doppler and duplex ultrasound were performed of bilateral carotid and vertebral arteries in the neck. COMPARISON:  None. FINDINGS: Criteria: Quantification of carotid stenosis is based on velocity parameters that correlate the residual internal carotid diameter with NASCET-based stenosis levels, using the diameter of the distal internal carotid lumen as the denominator for stenosis measurement. The following velocity measurements were obtained: RIGHT ICA:  99/33 cm/sec CCA:  72/15 cm/sec SYSTOLIC ICA/CCA RATIO:  1.4 ECA:  135 cm/sec LEFT ICA:  68/24 cm/sec CCA:  89/27 cm/sec SYSTOLIC ICA/CCA RATIO:  0.8 ECA:  66 cm/sec RIGHT CAROTID ARTERY: Mild amount of predominately calcified plaque is present at the level of the carotid bulb and proximal right ICA. Estimated right ICA stenosis is less than 50%. RIGHT VERTEBRAL ARTERY: Antegrade flow with normal waveform and velocity. LEFT CAROTID ARTERY: Mild amount of calcified plaque at the level of the carotid bulb and proximal left ICA. Estimated left ICA stenosis is less than  50%. LEFT VERTEBRAL ARTERY: Antegrade flow with normal waveform and velocity. IMPRESSION: Mild amount of plaque at the level of both carotid bulbs and proximal internal carotid arteries. Estimated bilateral ICA stenoses are less than 50%. Electronically Signed   By: Irish Lack M.D.   On: 01/26/2020 09:12               LOS: 1 day   Khia Dieterich  [facility]   Pager [phone]. If 7PM-7AM, please contact night-coverage at [email or web address]     01/27/2020, 1:38 PM

## 2020-01-27 NOTE — Progress Notes (Signed)
NEUROLOGY CONSULTATION PROGRESS NOTE   Date of service: January 27, 2020 Patient Name: Gabriella Rogers MRN:  [id number] DOB:  1934/06/20  History of Present Illness  Gabriella Rogers is a 86 y.o. female with PMH significant for mild cognitive impairment, cervical dystonia on botox and clonazepam, migraine, dizziness, depression, HLD, osteoporosis who presents with 2 day hx of R hand numbness and difficulty walking. Found to hare BL acute pontine strokes on MRI and CTA with high grade short segment basilar stenosis which is being managed medically with dual antiplatelet therapy. Given her advanced age, progressive dementia, risk of bleeding, family opted to not go for angiography and stenting.   Interval Hx   She is sitting in the bed, denies any complaints. Endorses some subjective R finger tip numbness and wants to get up and work with PT.  Past History   Past Medical History:  Diagnosis Date   Brain disorder 03/27/2014   Cervical dystonia 01/24/2012   Clinical depression 11/26/2015   Dizziness 11/26/2015   Overview:  Chronic, felt secondary to inner ear per ENT evaluation approximately 1996.    H/O total knee replacement 11/23/2015   Headache, migraine 11/26/2015   HLD (hyperlipidemia) 11/26/2015   Hypercholesteremia    Osteoporosis, post-menopausal 11/26/2015   Past Surgical History:  Procedure Laterality Date   ABDOMINAL HYSTERECTOMY     ANKLE SURGERY Left    KNEE SURGERY Right    Family History  Problem Relation Age of Onset   Kidney cancer Neg Hx    Hematuria Neg Hx    Social History   Socioeconomic History   Marital status: Married    Spouse name: Not on file   Number of children: Not on file   Years of education: Not on file   Highest education level: Not on file  Occupational History   Not on file  Tobacco Use   Smoking status: Never Smoker   Smokeless tobacco: Never Used  Substance and Sexual Activity   Alcohol use: No   Drug use: Not on file    Sexual activity: Not on file  Other Topics Concern   Not on file  Social History Narrative   Not on file   Social Determinants of Health   Financial Resource Strain:    Difficulty of Paying Living Expenses:   Food Insecurity:    Worried About Programme researcher, broadcasting/film/video in the Last Year:    Barista in the Last Year:   Transportation Needs:    Freight forwarder (Medical):    Lack of Transportation (Non-Medical):   Physical Activity:    Days of Exercise per Week:    Minutes of Exercise per Session:   Stress:    Feeling of Stress :   Social Connections:    Frequency of Communication with Friends and Family:    Frequency of Social Gatherings with Friends and Family:    Attends Religious Services:    Active Member of Clubs or Organizations:    Attends Engineer, structural:    Marital Status:    Allergies  Allergen Reactions   Actonel  [Risedronate Sodium]    Piper    Pneumococcal Vaccine Other (See Comments)    WEAKNESS, DISORIENTED, "ARM SORE, RED, HURTING"   Risedronate Other (See Comments)    REDNESS IN FACE   Alendronate Rash   Aleve [Naproxen Sodium] Rash   Pravastatin Rash    Medications   Medications Prior to Admission  Medication Sig Dispense Refill  Last Dose   acetaminophen (TYLENOL) 325 MG tablet Take 650 mg by mouth every 6 (six) hours as needed.   prn at prn   clonazePAM (KLONOPIN) 0.5 MG tablet Take 0.5 mg by mouth 2 (two) times daily as needed.    prn at prn   donepezil (ARICEPT) 10 MG tablet Take by mouth.   01/24/2020 at 1930   Multiple Vitamins-Minerals (ONE-A-DAY WOMENS PO) Take 1 tablet by mouth daily.   01/25/2020 at Unknown time     Vitals  Temp:  [97.6 F (36.4 C)-98.4 F (36.9 C)] 98.3 F (36.8 C) (08/08 1122) Pulse Rate:  [68-86] 71 (08/08 1122) Resp:  [16-26] 17 (08/08 1122) BP: (97-178)/(56-90) 97/70 (08/08 1122) SpO2:  [97 %-100 %] 98 % (08/08 1122) Weight:  [53.6 kg-54 kg] 54 kg (08/08 0635)   Body mass index is 21.1 kg/m.  Physical Exam   General: Laying comfortably in bed; in no acute distress. HENT: Normal oropharynx and mucosa. Normal external appearance of ears and nose. Neck: Supple, no pain or tenderness CV: No JVD. No peripheral edema. Pulmonary: Symmetric Chest rise. Normal respiratory effort. Abdomen: Soft, non-tender Ext: No cyanosis, edema, or deformity  Skin: No rash. Normal palpation of skin.   Musculoskeletal: Normal digits and nails by inspection. No clubbing.  Neurologic Examination  Mental status/Cognition: Alert, oriented to self, place, poor insight. Speech/language: Fluent, comprehension intact. Cranial nerves:   CN II Pupils equal and reactive to light, no VF deficits   CN III,IV,VI EOM intact, no gaze preference or deviation, no nystagmus   CN V Decreased in L face to touch.   CN VII R facial droop.   CN VIII normal hearing to speech   CN IX & X normal palatal elevation, no uvular deviation   CN XI 5/5 head turn and 5/5 shoulder shrug bilaterally   CN XII midline tongue protrusion   Motor:  Muscle bulk: decreased, tone normal, pronator drift none. Moves all extremities spontaneously and antigravity  Sensation:  Light touch Decreased in L UE and LLE   Pin prick Decreased in LUE and LLE   Temperature    Vibration   Proprioception    Coordination/Complex Motor:  - Finger to Nose with ataxia BL   Labs   Lab Results  Component Value Date   NA 138 01/25/2020   K 4.3 01/25/2020   CL 105 01/25/2020   CO2 24 01/25/2020   GLUCOSE 97 01/25/2020   BUN 15 01/25/2020   CREATININE 0.70 01/25/2020   CALCIUM 9.1 01/25/2020   ALBUMIN 4.2 01/25/2020   AST 21 01/25/2020   ALT 14 01/25/2020   ALKPHOS 72 01/25/2020   BILITOT 0.9 01/25/2020   GFRNONAA >60 01/25/2020   GFRAA >60 01/25/2020     Imaging and Diagnostic studies  MRI Brain without contrast: 1. Bilateral acute pontine infarcts, left slightly greater than right. 2. Findings of  chronic ischemic microangiopathy.  CT Angio Head and Neck: 1. Acute infarcts within the pons bilaterally were better appreciated on the same-day brain MRI. 2. No interval infarct is identified. 3. Moderate generalized parenchymal atrophy and chronic small vessel ischemic disease.   Impression   Gabriella Rogers is a 86 y.o. female with PMH significant for with PMH significant for mild cognitive impairment, cervical dystonia on botox and clonazepam, migraine, dizziness, depression, HLD, osteoporosis who presents with 2 day hx of fluctuating R hand numbness, dysarthria and difficulty walking.  MRI brain demonstrated acute bilateral pontine infarcts and CT angio with short  segment high-grade basilar stenosis. She was started on dual antiplatelet therapy and intervention was not pursued after discussion with family 2/2 advanced age, poor baseline from longstnading dementia.  Recommendations  - Continue Aspirin 325 and Plavix 75mg  daily for 3 months, then only aspirin 325. - TTE is still pending. - Neuro checks Q2H - Continue Permissive HTN for another 24 hours, then aim for gradual normotension. - HbA1c - LDL of 195, started on Atorvastatin 80mg  daily ______________________________________________________________________   Thank you for the opportunity to take part in the care of this patient. If you have any further questions, please contact the neurology consultation attending.  Signed,  [facility] Pager Number 

## 2020-01-27 NOTE — Progress Notes (Signed)
Pt refuses IV access. Provider notified.

## 2020-01-27 NOTE — Evaluation (Signed)
Physical Therapy Evaluation Patient Details Name: Gabriella Rogers MRN: [id number] DOB: Dec 01, 1933 Today's Date: 01/27/2020   History of Present Illness  Pt is an 86 y.o. female presenting to hospital 8/6 with R hand numbness, pain behind L eye, difficulty walking, and slurred speech; onset 2 days prior (pt refused EMS transport 1st day).  MRI of brain showing B acute pontine infarcts (L slightly greater than R).  PMH includes brain disorder, cervical dystonia, dizziness, h/o TKR, L ankle surgery, per chart also dementia.  Clinical Impression  Prior to hospital admission, pt reports being ambulatory with RW (has assist from husband at times for mobility); lives with her husband on main level of home with 1 STE.  Pt pleasant and cooperative during session; requesting to toilet (instead of using Purewick).  Pt reporting R hand numbness but decreased sensation to light touch L fingers and L great toe; R facial droop noted; impaired R knee flexion ROM baseline.  Currently pt is SBA semi-supine to sitting edge of bed; min to mod assist with transfers; and min assist to ambulate 40 feet with RW (R lean noted in standing and with ambulation; assist required for balance).  No c/o pain during session.  BP 174/68 prior to OOB mobility and 139/60 end of session at rest (MD Myriam Forehand and pt's nurse notified).  Pt would benefit from skilled PT to address noted impairments and functional limitations (see below for any additional details).  Upon hospital discharge, pt would benefit from STR (or pt will require 24/7 physical assist with functional mobility at home for safety).    Follow Up Recommendations SNF    Equipment Recommendations  Rolling walker with 5" wheels;3in1 (PT)    Recommendations for Other Services OT consult     Precautions / Restrictions Precautions Precautions: Fall Restrictions Weight Bearing Restrictions: No      Mobility  Bed Mobility Overal bed mobility: Needs Assistance Bed Mobility:  Supine to Sit     Supine to sit: Supervision;HOB elevated     General bed mobility comments: increased effort/time for pt to perform on own  Transfers Overall transfer level: Needs assistance Equipment used: Rolling walker (2 wheeled) Transfers: Sit to/from [employer] Sit to Stand: Min assist (x2 trials from bed and x1 trial from [facility]) Stand pivot transfers: Mod assist;Min assist (stand step turn bed to [facility] (no AD) mod assist and stand step turn BSC to recliner with RW min assist for balance)       General transfer comment: vc's for UE/LE placement and overall technique  Ambulation/Gait Ambulation/Gait assistance: Min assist Gait Distance (Feet): 40 Feet Assistive device: Rolling walker (2 wheeled)   Gait velocity: decreased   General Gait Details: R lean noted; decreased B LE step length/foot clearance/heelstrike; intermittently unsteady requiring assist for balance  Stairs            Wheelchair Mobility    Modified Rankin (Stroke Patients Only)       Balance Overall balance assessment: Needs assistance Sitting-balance support: No upper extremity supported;Feet supported Sitting balance-Leahy Scale: Good Sitting balance - Comments: steady sitting reaching within BOS   Standing balance support: Bilateral upper extremity supported;During functional activity Standing balance-Leahy Scale: Poor Standing balance comment: R lean noted in standing and ambulation with RW requiring assist for balance                             Pertinent Vitals/Pain Pain Assessment: No/denies pain  HR  and O2 sats WFL on room air during sessions activities.    Home Living Family/patient expects to be discharged to:: Private residence Living Arrangements: Spouse/significant other Available Help at Discharge: Family Type of Home: House Home Access: Stairs to enter Entrance Stairs-Rails: None Entrance Stairs-Number of Steps: 1 Home Layout: One  level;Other (Comment) (with basement) Home Equipment: Grab bars - tub/shower;Walker - 2 wheels      Prior Function Level of Independence: Needs assistance   Gait / Transfers Assistance Needed: Pt reports her husband assisted her at times with ambulation but otherwise ambulated on her own with RW.  H/o 1 recent fall (pt showing her R distal elbow area that was bruised)           Hand Dominance        Extremity/Trunk Assessment   Upper Extremity Assessment Upper Extremity Assessment: Defer to OT evaluation (Pt reporting numbness R hand but decreased light touch sensation to L hand)    Lower Extremity Assessment Lower Extremity Assessment: LLE deficits/detail;RLE deficits/detail (impaired R knee flexion ROM (pt reports baseline)) RLE Deficits / Details: impaired R knee flexion ROM baseline; at least 3/5 AROM ankle DF/PF, knee flexion/extension (in available range), and hip flexion LLE Deficits / Details: strength and ROM WFL; decreased sensation L great toe    Cervical / Trunk Assessment Cervical / Trunk Assessment: Normal  Communication   Communication: No difficulties  Cognition Arousal/Alertness: Awake/alert Behavior During Therapy: WFL for tasks assessed/performed Overall Cognitive Status: Within Functional Limits for tasks assessed                                 General Comments: Oriented to person, place, time, and situation.  Pleasant and cooperative.      General Comments General comments (skin integrity, edema, etc.): bruise noted distal R elbow area (pt reports from recent fall prior to hospitalization).  Nursing cleared pt for participation in physical therapy.  Pt agreeable to PT session.    Exercises  Transfers and ambulation   Assessment/Plan    PT Assessment Patient needs continued PT services  PT Problem List Decreased strength;Decreased activity tolerance;Decreased balance;Decreased mobility;Decreased knowledge of use of DME;Decreased  knowledge of precautions       PT Treatment Interventions DME instruction;Gait training;Stair training;Functional mobility training;Therapeutic activities;Therapeutic exercise;Balance training;Patient/family education    PT Goals (Current goals can be found in the Care Plan section)  Acute Rehab PT Goals Patient Stated Goal: to improve mobility PT Goal Formulation: With patient Time For Goal Achievement: 02/10/20 Potential to Achieve Goals: Good    Frequency 7X/week   Barriers to discharge Decreased caregiver support      Co-evaluation               AM-PAC PT "6 Clicks" Mobility  Outcome Measure Help needed turning from your back to your side while in a flat bed without using bedrails?: None Help needed moving from lying on your back to sitting on the side of a flat bed without using bedrails?: A Little Help needed moving to and from a bed to a chair (including a wheelchair)?: A Lot Help needed standing up from a chair using your arms (e.g., wheelchair or bedside chair)?: A Little Help needed to walk in hospital room?: A Little Help needed climbing 3-5 steps with a railing? : A Lot 6 Click Score: 17    End of Session Equipment Utilized During Treatment: Gait belt Activity Tolerance: Patient  tolerated treatment well Patient left: in chair;with call bell/phone within reach;with chair alarm set;with SCD's reapplied Nurse Communication: Mobility status;Precautions;Other (comment) (Pt's BP during session) PT Visit Diagnosis: Unsteadiness on feet (R26.81);Other abnormalities of gait and mobility (R26.89);Muscle weakness (generalized) (M62.81);History of falling (Z91.81);Difficulty in walking, not elsewhere classified (R26.2)    Time: 4010-2725 PT Time Calculation (min) (ACUTE ONLY): 47 min   Charges:   PT Evaluation $PT Eval Low Complexity: 1 Low PT Treatments $Gait Training: 8-22 mins $Therapeutic Activity: 8-22 mins       Hendricks Limes, PT 01/27/20, 1:22  PM

## 2020-01-27 NOTE — Evaluation (Signed)
Occupational Therapy Evaluation Patient Details Name: Gabriella Rogers MRN: [id number] DOB: [birth year] Today's Date: 01/27/2020    History of Present Illness Pt is an 86 y.o. female presenting to hospital 8/6 with R hand numbness, pain behind L eye, difficulty walking, and slurred speech; onset 2 days prior (pt refused EMS transport 1st day).  MRI of brain showing B acute pontine infarcts (L slightly greater than R).  PMH includes brain disorder, cervical dystonia, dizziness, h/o TKR, L ankle surgery, per chart also dementia.   Clinical Impression   Pt was seen for OT evaluation this date. Prior to hospital admission, pt was Indep with self care ADLs and cleaning, reports husband primarily does the cooking. Pt lives with spouse in [facility] with 1 STE. Currently pt demonstrates impairments as described below (See OT problem list) which functionally limit her ability to perform ADL/self-care tasks. Pt currently requires MIN A with sit to stand with RW, MIN/MOD A with SPS transfers such as to commode, MIN/MOD A with peri care, and MOD A with LB dressing.  Pt would benefit from skilled OT to address noted impairments and functional limitations (see below for any additional details) in order to maximize safety and independence while minimizing falls risk and caregiver burden. Upon hospital discharge, recommend STR to maximize pt safety and return to PLOF.     Follow Up Recommendations  SNF    Equipment Recommendations  Other (comment) (defer to next level of care)    Recommendations for Other Services       Precautions / Restrictions Precautions Precautions: Fall Restrictions Weight Bearing Restrictions: No      Mobility Bed Mobility Overal bed mobility: Needs Assistance Bed Mobility: Sit to Supine       Sit to supine: Supervision   General bed mobility comments: increased time  Transfers Overall transfer level: Needs assistance Equipment used: Rolling walker (2 wheeled) Transfers: Sit  to/from [employer] Sit to Stand: Min assist Stand pivot transfers: Min assist;Mod assist       General transfer comment: cues for safety/hand placement    Balance Overall balance assessment: Needs assistance Sitting-balance support: No upper extremity supported;Feet supported Sitting balance-Leahy Scale: Good   Postural control: Right lateral lean Standing balance support: Bilateral upper extremity supported;During functional activity Standing balance-Leahy Scale: Poor                             ADL either performed or assessed with clinical judgement   ADL Overall ADL's : Needs assistance/impaired     Grooming: Minimal assistance;Standing Grooming Details (indicate cue type and reason): with RW sink-side to wash hands to stabilize R side and MIN cues for safety awareness as pt not aware of R lateral lean         Upper Body Dressing : Min guard;Sitting   Lower Body Dressing: Moderate assistance;Sitting/lateral leans Lower Body Dressing Details (indicate cue type and reason): to thread socks Toilet Transfer: Ambulation;RW;Grab bars;Cueing for safety;Minimal assistance;Moderate assistance   Toileting- Clothing Manipulation and Hygiene: Minimal assistance;Sitting/lateral lean Toileting - Clothing Manipulation Details (indicate cue type and reason): MIN A for safety with leaning as pt unaware of self in space and leans very far off of commode to attempt peri care     Functional mobility during ADLs: Minimal assistance;Rolling walker       Vision Patient Visual Report: No change from baseline       Perception     Praxis  Pertinent Vitals/Pain Pain Assessment: No/denies pain     Hand Dominance     Extremity/Trunk Assessment Upper Extremity Assessment Upper Extremity Assessment: RUE deficits/detail;LUE deficits/detail RUE Deficits / Details: ROM WFL, MMT 4-/5 RUE Sensation: decreased light touch;decreased proprioception RUE  Coordination: decreased fine motor LUE Deficits / Details: ROM WFL, MMT 4/5   Lower Extremity Assessment Lower Extremity Assessment: Defer to PT evaluation   Cervical / Trunk Assessment Cervical / Trunk Assessment: Other exceptions (head oriented to left more comfortably, pt describes baseline of difficult R cervical rotation 2/2 h/o cervical dystonia)   Communication Communication Communication: No difficulties   Cognition Arousal/Alertness: Awake/alert Behavior During Therapy: WFL for tasks assessed/performed;Impulsive;Anxious Overall Cognitive Status: Within Functional Limits for tasks assessed                                 General Comments: oriented and pleasant, but somewhat impulsive/rushing   General Comments       Exercises Other Exercises Other Exercises: OT facilitates education re: role of OT in acute setting, safety with ADLs, safe use of AD for fxl mobility, hand placement for transfers, fall precention, use of call light. Pt with good understanding.   Shoulder Instructions      Home Living Family/patient expects to be discharged to:: Private residence Living Arrangements: Spouse/significant other Available Help at Discharge: Family Type of Home: House Home Access: Stairs to enter [employer] of Steps: 1 Entrance Stairs-Rails: None Home Layout: One level;Other (Comment)     Bathroom Shower/Tub: Chief Strategy Officer: Standard     Home Equipment: Grab bars - tub/shower;Walker - 2 wheels          Prior Functioning/Environment Level of Independence: Needs assistance  Gait / Transfers Assistance Needed: Pt reports her husband assisted her at times with ambulation but otherwise ambulated on her own with RW.  H/o 1 recent fall (pt showing her R distal elbow area that was bruised)              OT Problem List: Decreased strength;Decreased range of motion;Decreased activity tolerance;Impaired balance (sitting and/or  standing);Decreased coordination;Decreased knowledge of use of DME or AE;Decreased safety awareness;Impaired sensation      OT Treatment/Interventions: Self-care/ADL training;Therapeutic exercise;Neuromuscular education;DME and/or AE instruction;Therapeutic activities;Balance training;Patient/family education    OT Goals(Current goals can be found in the care plan section) Acute Rehab OT Goals Patient Stated Goal: to be independent again OT Goal Formulation: With patient Time For Goal Achievement: 02/10/20 Potential to Achieve Goals: Good ADL Goals Pt Will Perform Lower Body Bathing: with min guard assist;sit to/from stand Pt Will Perform Lower Body Dressing: sit to/from stand;with min guard assist (with AE PRN) Pt Will Transfer to Toilet: with min guard assist;ambulating;grab bars (with RW to restroom) Pt Will Perform Toileting - Clothing Manipulation and hygiene: with min guard assist;sit to/from stand  OT Frequency: Min 2X/week   Barriers to D/C:            Co-evaluation              AM-PAC OT "6 Clicks" Daily Activity     Outcome Measure Help from another person eating meals?: A Little Help from another person taking care of personal grooming?: A Little Help from another person toileting, which includes using toliet, bedpan, or urinal?: A Little Help from another person bathing (including washing, rinsing, drying)?: A Lot Help from another person to put on and taking off  regular upper body clothing?: A Little Help from another person to put on and taking off regular lower body clothing?: A Lot 6 Click Score: 16   End of Session Equipment Utilized During Treatment: Gait belt;Rolling walker Nurse Communication: Mobility status  Activity Tolerance: Patient tolerated treatment well Patient left: in bed;with call bell/phone within reach;with bed alarm set  OT Visit Diagnosis: Unsteadiness on feet (R26.81);Other abnormalities of gait and mobility (R26.89);Muscle weakness  (generalized) (M62.81);Hemiplegia and hemiparesis Hemiplegia - Right/Left: Right Hemiplegia - caused by: Cerebral infarction                Time: 5188-4166 OT Time Calculation (min): 39 min Charges:  OT General Charges $OT Visit: 1 Visit OT Evaluation $OT Eval Moderate Complexity: 1 Mod OT Treatments $Self Care/Home Management : 8-22 mins $Therapeutic Activity: 8-22 mins  Rejeana Brock, MS, OTR/L ascom [phone] 01/27/20, 8:46 PM

## 2020-01-27 NOTE — [place] FL2 (Signed)
[place] MEDICAID FL2 LEVEL OF CARE SCREENING TOOL     IDENTIFICATION  Patient Name: Gabriella Rogers Birthdate: 1933/10/12 Sex: female Admission Date (Current Location): 01/25/2020  [place] and [place] Number:  Chiropodist and Address:  [facility], [address] [place], [place] [address]      Provider Number:    Attending Physician Name and Address:  Lurene Shadow, MD  Relative Name and Phone Number:  Shon Hough [phone]    Current Level of Care: SNF Recommended Level of Care: Skilled Nursing Facility Prior Approval Number:    Date Approved/Denied:   PASRR Number: Pending  Discharge Plan: SNF    Current Diagnoses: Patient Active Problem List   Diagnosis Date Noted  . Acute CVA (cerebrovascular accident) (HCC) 01/26/2020  . Mild cognitive impairment 01/26/2020  . Depression 11/26/2015  . Dizziness 11/26/2015  . Hyperlipidemia, unspecified 11/26/2015  . Headache, migraine 11/26/2015  . Osteoporosis, post-menopausal 11/26/2015  . H/O total knee replacement 11/23/2015  . Arthropathy, traumatic, knee 03/05/2015  . Brain disorder 03/27/2014  . Cervical dystonia 01/24/2012    Orientation RESPIRATION BLADDER Height & Weight     Self, Time, Situation, Place  Normal Continent Weight: 119 lb 1.6 oz (54 kg) Height:  5\' 3"  (160 cm)  BEHAVIORAL SYMPTOMS/MOOD NEUROLOGICAL BOWEL NUTRITION STATUS      Continent Diet  AMBULATORY STATUS COMMUNICATION OF NEEDS Skin   Limited Assist Verbally Normal                       Personal Care Assistance Level of Assistance  Bathing, Feeding, Dressing Bathing Assistance: Limited assistance Feeding assistance: Independent Dressing Assistance: Limited assistance     Functional Limitations Info  Sight, Hearing, Speech Sight Info: Impaired (wears glasses) Hearing Info: Adequate Speech Info: Adequate    SPECIAL CARE FACTORS FREQUENCY  PT (By licensed PT), OT (By licensed OT)      PT Frequency: 5x per week OT Frequency: 5x por week            Contractures Contractures Info: Not present    Additional Factors Info  Code Status Code Status Info: full code             Current Medications (01/27/2020):  This is the current hospital active medication list Current Facility-Administered Medications  Medication Dose Route Frequency Provider Last Rate Last Admin  .  stroke: mapping our early stages of recovery book   Does not apply Once 03/28/2020, MD      . 0.9 %  sodium chloride infusion   Intravenous Continuous Andris Baumann, MD 50 mL/hr at 01/27/20 1527 Rate Verify at 01/27/20 1527  . acetaminophen (TYLENOL) tablet 650 mg  650 mg Oral Q4H PRN 03/28/20, MD       Or  . acetaminophen (TYLENOL) 160 MG/5ML solution 650 mg  650 mg Per Tube Q4H PRN Andris Baumann, MD       Or  . acetaminophen (TYLENOL) suppository 650 mg  650 mg Rectal Q4H PRN Andris Baumann, MD      . aspirin chewable tablet 243 mg  243 mg Oral Once Andris Baumann, MD      . Erick Blinks ON 01/28/2020] aspirin tablet 325 mg  325 mg Oral Daily 03/29/2020, MD      . atorvastatin (LIPITOR) tablet 80 mg  80 mg Oral q1800 Erick Blinks, MD   80 mg at 01/26/20 1756  . clonazePAM (KLONOPIN) tablet  0.5 mg  0.5 mg Oral BID PRN Lurene Shadow, MD   0.5 mg at 01/27/20 0126  . clopidogrel (PLAVIX) tablet 75 mg  75 mg Oral Daily Erick Blinks, MD   75 mg at 01/27/20 0800  . donepezil (ARICEPT) tablet 10 mg  10 mg Oral QHS Lurene Shadow, MD   10 mg at 01/26/20 2309  . enoxaparin (LOVENOX) injection 40 mg  40 mg Subcutaneous Daily Lurene Shadow, MD   40 mg at 01/27/20 1330     Discharge Medications: Please see discharge summary for a list of discharge medications.  Relevant Imaging Results:  Relevant Lab Results:   Additional Information social security number [id number]  Verna Czech [place], [place]

## 2020-01-28 DIAGNOSIS — I639 Cerebral infarction, unspecified: Secondary | ICD-10-CM

## 2020-01-28 IMAGING — CR DG SACRUM/COCCYX 2+V
1 series · 3 of 3 positions shown · non-contrast
Comparison: None.

CLINICAL DATA: Fall at home today. Sacrococcygeal pain. Initial
encounter.

EXAM:
SACRUM AND COCCYX - 2+ VIEW

[Series 1: dg sacrum/coccyx · 0.14mm/px · 3 of 3 slices shown]
[im 1/3]
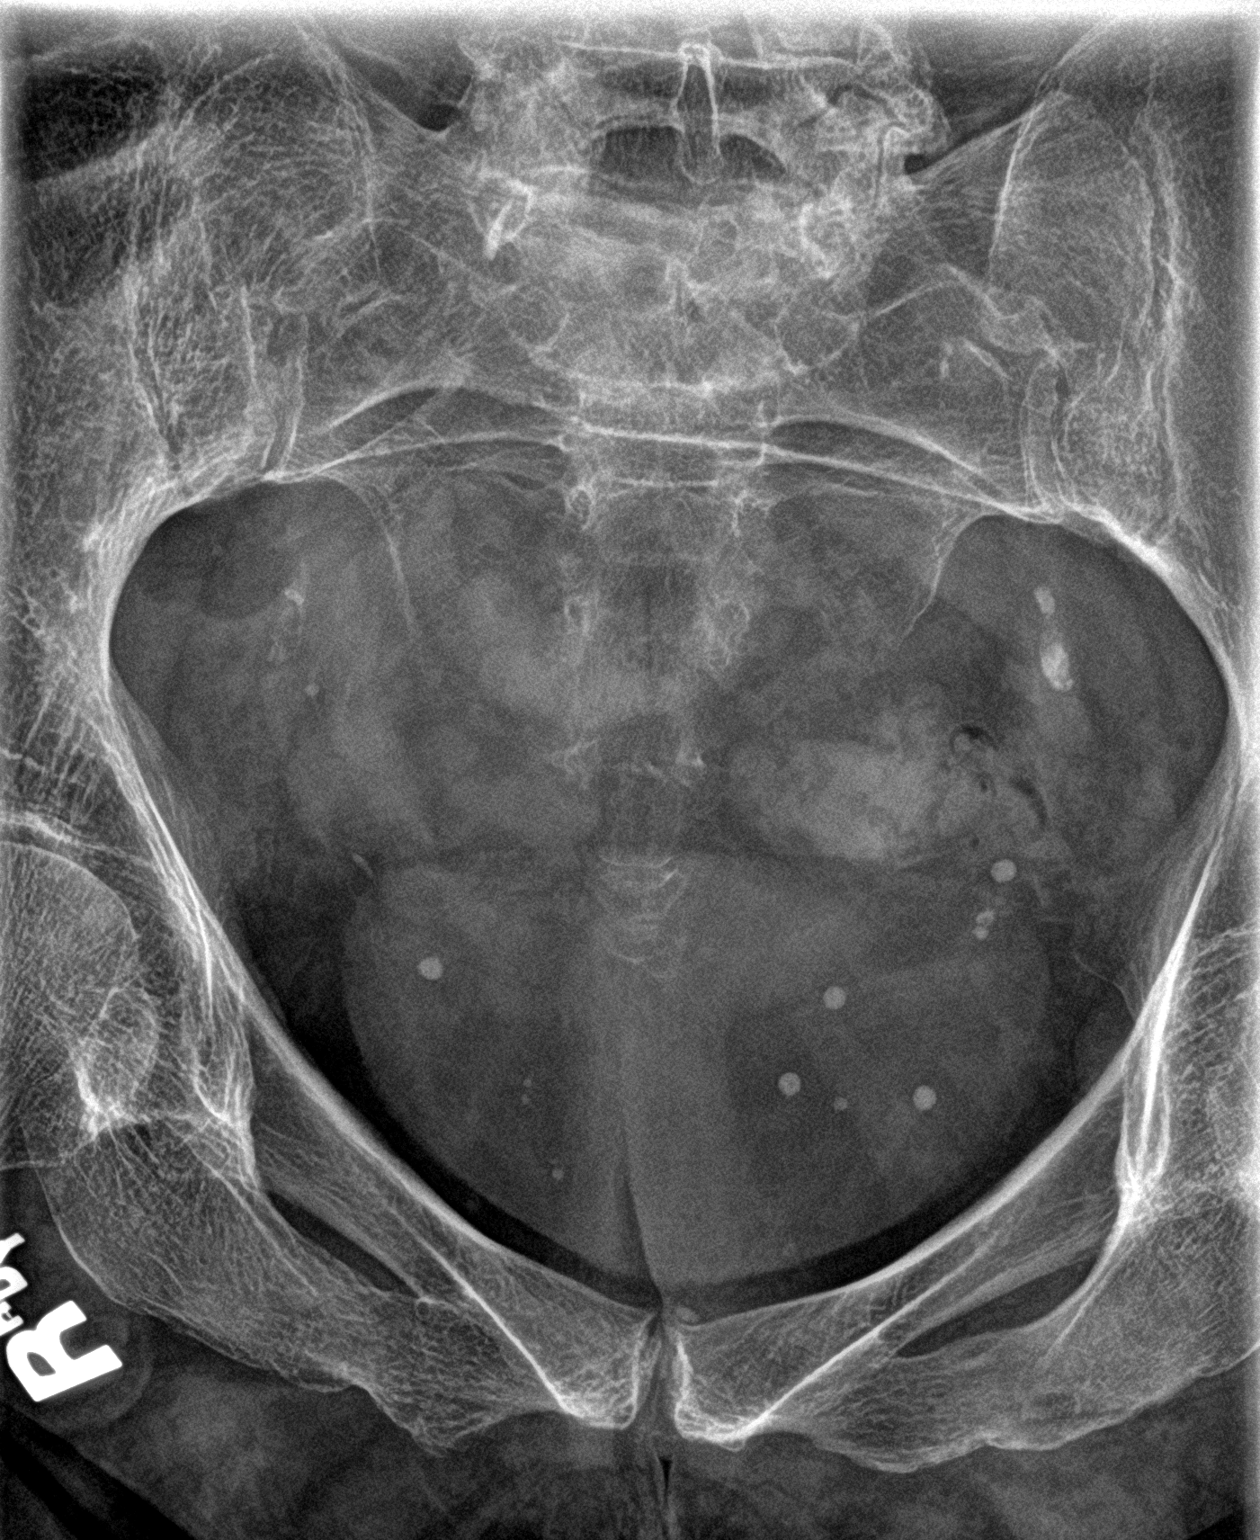
[im 2/3]
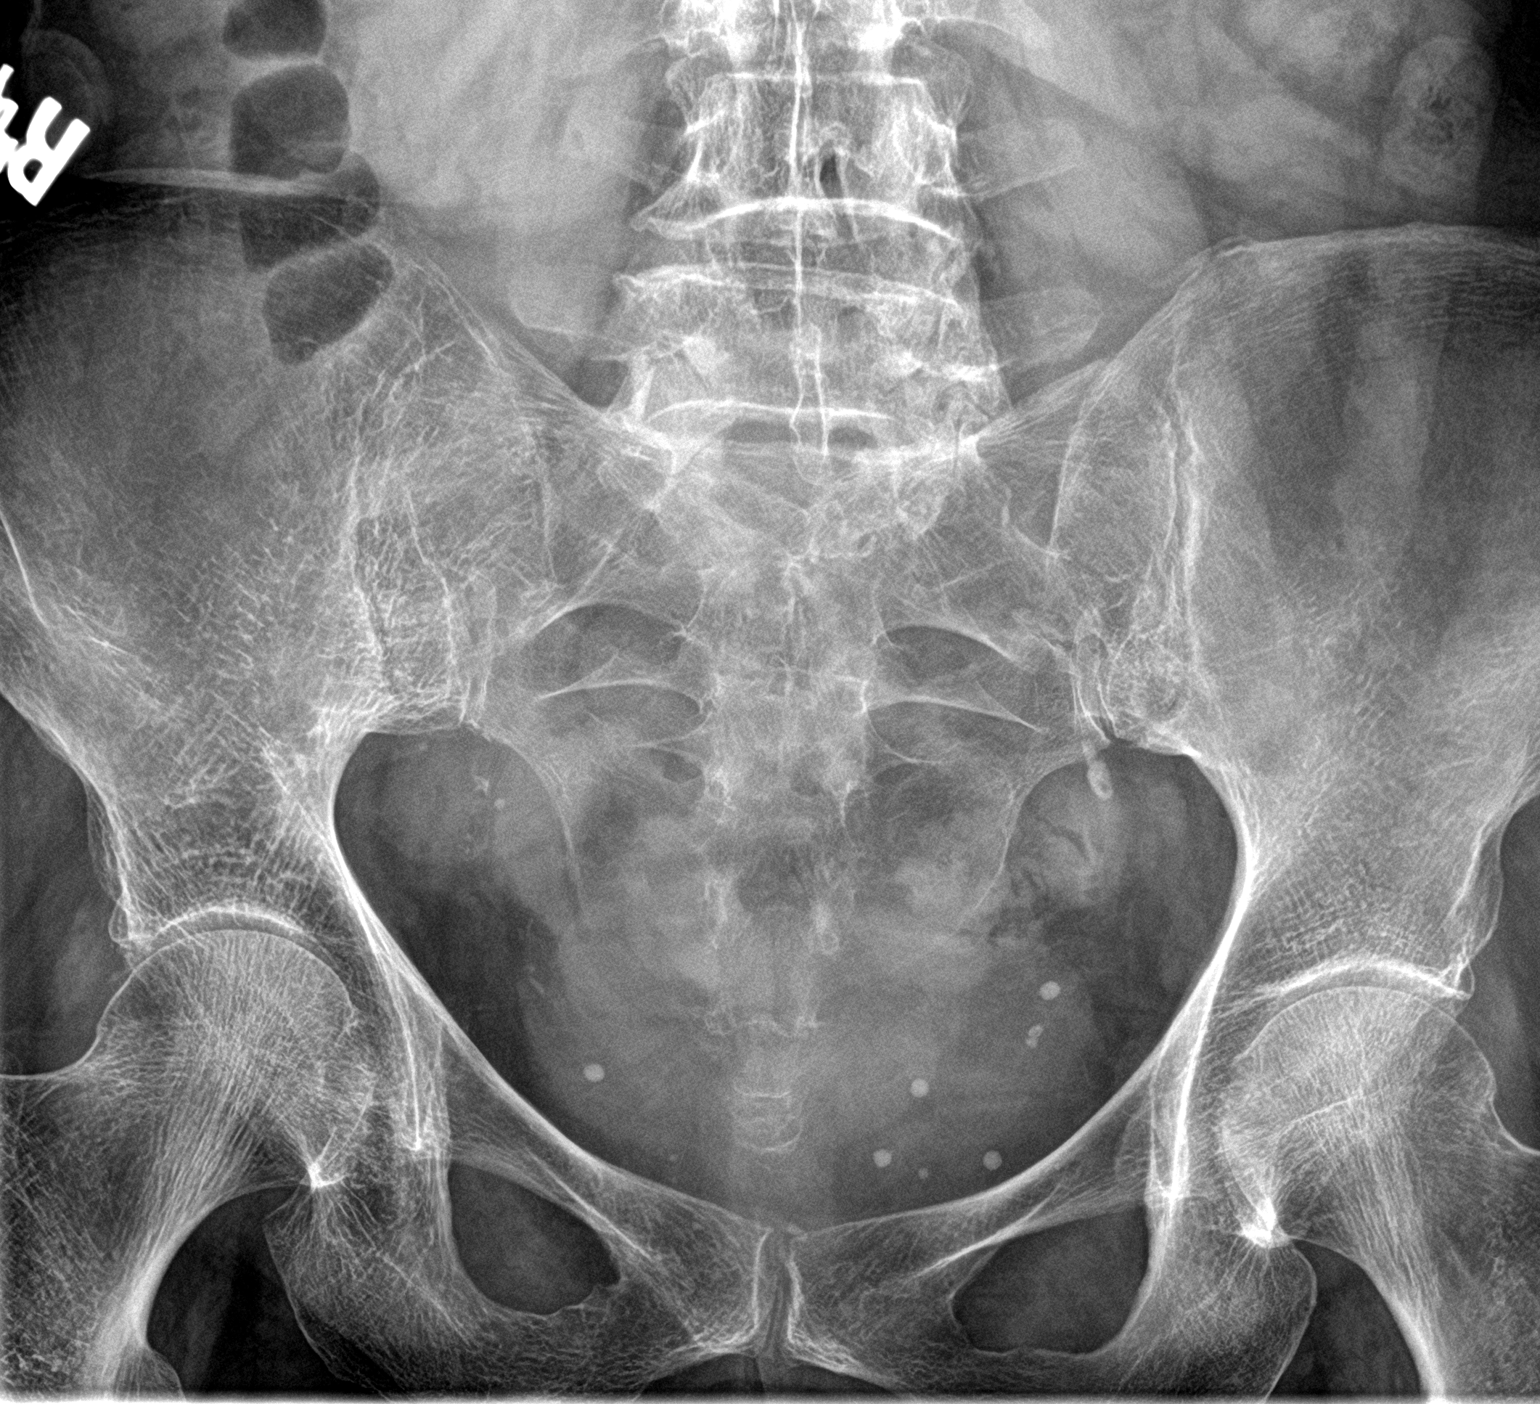
[im 3/3]
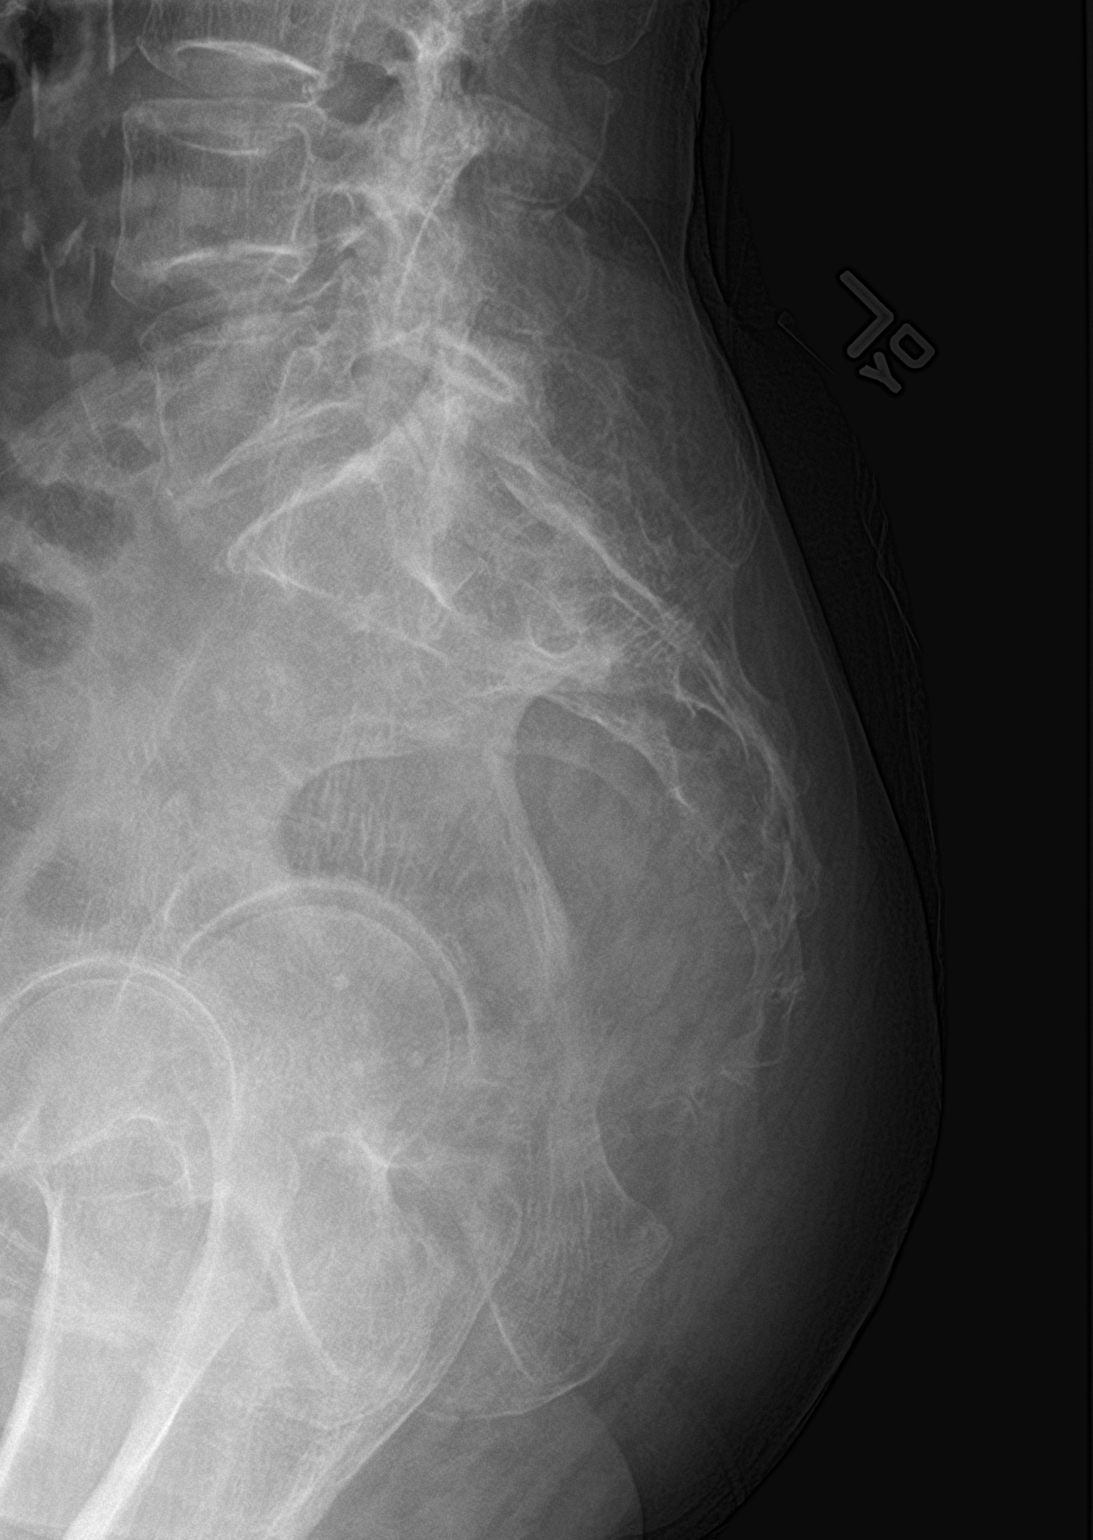

[3 of 3 positions shown; findings below may reference images not displayed]

FINDINGS: There is no evidence of fracture or other focal bone lesions.
IMPRESSION: Negative.

## 2020-01-28 NOTE — Progress Notes (Signed)
Received sign out to check stroke work up Bilateral pontine infarcts, likely large vessel etiology from the basilar stenosis. Advanced age, poor baseline from pre-existing dementia and co-morbities preclude endovascular intervention - as discussed by the outgoing neurologist with family and primary team.  -2d Echo with no evidence of LV thrombus. LVEF 55-60%. LA size normal. No IA shunt. -A1c5.6 - within goal (goal <7) -LDL 195 - high dose statin per Dr. Tollie Eth recs. -Normalize BP to goal <140/90 at discharge.  PT OT and f/u with outpatient neurology in 4-6 weeks.  Please call inpatient neurology with questions.   -- Milon Dikes, MD [facility] Pager: [phone] If 7pm to 7am, please call on call as listed on AMION.

## 2020-01-28 NOTE — TOC Progression Note (Signed)
Transition of Care [facility]) - Progression Note    Patient Details  Name: KACIE HUXTABLE MRN: [id number] Date of Birth: 07/01/1933  [facility] CM/SW Contact  Maree Krabbe, LCSW Phone Number: 01/28/2020, 2:34 PM  Clinical Narrative:   Bed offers presented to daughter/HCPOA and they choose [facility]. CSW notified [facility] to start [place].    Expected Discharge Plan: Skilled Nursing Facility Barriers to Discharge: No Barriers Identified  Expected Discharge Plan and Services Expected Discharge Plan: Skilled Nursing Facility In-house Referral: Clinical Social Work     Living arrangements for the past 2 months: Single Family Home                                       Social Determinants of Health (SDOH) Interventions    Readmission Risk Interventions No flowsheet data found.

## 2020-01-28 NOTE — Care Management Important Message (Signed)
Important Message  Patient Details  Name: Gabriella Rogers MRN: [id number] Date of Birth: April 17, 1934   Medicare Important Message Given:  Yes  Initial Medicare IM given by Patient Access Associate on 01/28/2020 at 12:21pm.     Johnell Comings 01/28/2020, 3:16 PM

## 2020-01-28 NOTE — Progress Notes (Signed)
Progress Note    Gabriella Rogers  [id number] DOB: May 04, 1934  DOA: 01/25/2020 PCP: Corky Downs, MD      Brief Narrative:    Medical records reviewed and are as summarized below:  Gabriella Rogers is a 86 y.o. female       Assessment/Plan:   Principal Problem:   Acute CVA (cerebrovascular accident) (HCC) Active Problems:   Cervical dystonia   Mild cognitive impairment   Acute bilateral pontine stroke High-grade mid basilar artery stenosis, high-grade proximal A2 right anterior cerebral artery stenosis and moderate stenosis within the distal M1 right middle cerebral artery Dysphagia Orthostatic hypotension Hyperlipidemia (total cholesterol 274, LDL 195, HDL 66, triglycerides 65) Hypertension Mild cognitive dysfunction   PLAN  2D echo showed EF estimated at 55 to 60%, normal LV diastolic parameters.  Hemoglobin A1c is 5.6. Continue home aspirin and Plavix for 3 months and switch to aspirin monotherapy Continue Lipitor Discontinue IV fluids. Allow for permissive hypertension and consider fluid bolus if BP drops significantly Repeat CT head and CTA head if there is significant change in mental status (this was discussed with neurologist, Dr. Derry Lory). Per neurologist, patient's daughter has opted for medical management rather than surgical treatment or stent placement for high-grade basilar artery stenosis Continue Aricept  She is on dysphagia 3 diet PT recommends discharge to SNF.   Body mass index is 21.07 kg/m.  Diet Order            DIET DYS 3 Room service appropriate? Yes; Fluid consistency: Thin  Diet effective now                       Medications:   .  stroke: mapping our early stages of recovery book   Does not apply Once  . aspirin  325 mg Oral Daily  . atorvastatin  80 mg Oral q1800  . clopidogrel  75 mg Oral Daily  . donepezil  10 mg Oral QHS  . enoxaparin (LOVENOX) injection  40 mg Subcutaneous Daily   Continuous  Infusions:    Anti-infectives (From admission, onward)   None             Family Communication/Anticipated D/C date and plan/Code Status   DVT prophylaxis: enoxaparin (LOVENOX) injection 40 mg Start: 01/27/20 1200 SCD's Start: 01/26/20 0132     Code Status: Full Code  Family Communication: Plan discussed with patient Disposition Plan:    Status is: Inpatient  Remains inpatient appropriate because:Unsafe d/c plan and Inpatient level of care appropriate due to severity of illness   Dispo: The patient is from: Home              Anticipated d/c is to: SNF              Anticipated d/c date is: 1 day              Patient currently is not medically stable to d/c.           Subjective:   She complains of fatigue.  No other complaints.   Objective:    Vitals:   01/28/20 0023 01/28/20 0429 01/28/20 0739 01/28/20 1131  BP: (!) 150/87 (!) 160/69 (!) 177/80 133/62  Pulse: 66 66 72 65  Resp: 20 16 16 17   Temp: 97.9 F (36.6 C) 98 F (36.7 C) (!) 97.5 F (36.4 C) 97.7 F (36.5 C)  TempSrc: Oral  Oral Oral  SpO2: 98% 98% 100% 98%  Weight:  53.9 kg    Height:       No data found.   Intake/Output Summary (Last 24 hours) at 01/28/2020 1542 Last data filed at 01/28/2020 1330 Gross per 24 hour  Intake 240 ml  Output 600 ml  Net -360 ml   Filed Weights   01/26/20 1958 01/27/20 0635 01/28/20 0429  Weight: 53.6 kg 54 kg 53.9 kg    Exam:  GEN: NAD SKIN: No rash EYES: No pallor or icterus ENT: MMM CV: RRR PULM: No wheezing or rales. ABD: soft, ND, NT, +BS CNS: AAO x 3, mild left-sided weakness (power 4/5) EXT: No edema or tenderness   Data Reviewed:   I have personally reviewed following labs and imaging studies:  Labs: Labs show the following:   Basic Metabolic Panel: Recent Labs  Lab 01/25/20 1152  NA 138  K 4.3  CL 105  CO2 24  GLUCOSE 97  BUN 15  CREATININE 0.70  CALCIUM 9.1   GFR Estimated Creatinine Clearance: 43.3 mL/min  (by C-G formula based on SCr of 0.7 mg/dL). Liver Function Tests: Recent Labs  Lab 01/25/20 1152  AST 21  ALT 14  ALKPHOS 72  BILITOT 0.9  PROT 7.9  ALBUMIN 4.2   No results for input(s): LIPASE, AMYLASE in the last 168 hours. No results for input(s): AMMONIA in the last 168 hours. Coagulation profile Recent Labs  Lab 01/25/20 1152  INR 1.0    CBC: Recent Labs  Lab 01/25/20 1152  WBC 5.5  NEUTROABS 3.5  HGB 12.4  HCT 36.1  MCV 100.6*  PLT 316   Cardiac Enzymes: No results for input(s): CKTOTAL, CKMB, CKMBINDEX, TROPONINI in the last 168 hours. BNP (last 3 results) No results for input(s): PROBNP in the last 8760 hours. CBG: No results for input(s): GLUCAP in the last 168 hours. D-Dimer: No results for input(s): DDIMER in the last 72 hours. Hgb A1c: Recent Labs    01/26/20 0956  HGBA1C 5.6   Lipid Profile: Recent Labs    01/26/20 0956  CHOL 274*  HDL 66  LDLCALC 195*  TRIG 65  CHOLHDL 4.2   Thyroid function studies: No results for input(s): TSH, T4TOTAL, T3FREE, THYROIDAB in the last 72 hours.  Invalid input(s): FREET3 Anemia work up: No results for input(s): VITAMINB12, FOLATE, FERRITIN, TIBC, IRON, RETICCTPCT in the last 72 hours. Sepsis Labs: Recent Labs  Lab 01/25/20 1152  WBC 5.5    Microbiology Recent Results (from the past 240 hour(s))  SARS Coronavirus 2 by RT PCR (hospital order, performed in [facility] hospital lab) Nasopharyngeal Nasopharyngeal Swab     Status: None   Collection Time: 01/26/20  3:50 PM   Specimen: Nasopharyngeal Swab  Result Value Ref Range Status   SARS Coronavirus 2 NEGATIVE NEGATIVE Final    Comment: (NOTE) SARS-CoV-2 target nucleic acids are NOT DETECTED.  The SARS-CoV-2 RNA is generally detectable in upper and lower respiratory specimens during the acute phase of infection. The lowest concentration of SARS-CoV-2 viral copies this assay can detect is 250 copies / mL. A negative result does not preclude  SARS-CoV-2 infection and should not be used as the sole basis for treatment or other patient management decisions.  A negative result may occur with improper specimen collection / handling, submission of specimen other than nasopharyngeal swab, presence of viral mutation(s) within the areas targeted by this assay, and inadequate number of viral copies (<250 copies / mL). A negative result must be combined with clinical observations,  patient history, and epidemiological information.  Fact Sheet for Patients:   BoilerBrush.com.cy  Fact Sheet for Healthcare Providers: https://pope.com/  This test is not yet approved or  cleared by the [place] FDA and has been authorized for detection and/or diagnosis of SARS-CoV-2 by FDA under an Emergency Use Authorization (EUA).  This EUA will remain in effect (meaning this test can be used) for the duration of the COVID-19 declaration under Section 564(b)(1) of the Act, 21 U.S.C. section 360bbb-3(b)(1), unless the authorization is terminated or revoked sooner.  Performed at [facility], [address] [place], [place] [address]     Procedures and diagnostic studies:  No results found.             LOS: 2 days   Lurene Shadow  [facility]   Pager [phone]. If 7PM-7AM, please contact night-coverage at [email or web address]     01/28/2020, 3:42 PM

## 2020-01-29 NOTE — Progress Notes (Signed)
Progress Note    Gabriella Rogers  KVQ:[id number] DOB: 1934-04-30  DOA: 01/25/2020 PCP: Corky Downs, MD      Brief Narrative:    Medical records reviewed and are as summarized below:  Gabriella Rogers is a 86 y.o. female       Assessment/Plan:   Principal Problem:   Acute CVA (cerebrovascular accident) (HCC) Active Problems:   Cervical dystonia   Mild cognitive impairment   Acute bilateral pontine stroke High-grade mid basilar artery stenosis, high-grade proximal A2 right anterior cerebral artery stenosis and moderate stenosis within the distal M1 right middle cerebral artery Dysphagia Orthostatic hypotension Hyperlipidemia (total cholesterol 274, LDL 195, HDL 66, triglycerides 65) Hypertension Mild cognitive dysfunction   PLAN  2D echo showed EF estimated at 55 to 60%, normal LV diastolic parameters.  Hemoglobin A1c is 5.6. Continue home aspirin and Plavix for 3 months and switch to aspirin monotherapy Continue Lipitor Allow for permissive hypertension and consider fluid bolus if BP drops significantly Repeat CT head and CTA head if there is significant change in mental status (this was discussed with neurologist, Dr. Derry Lory). Continue Aricept  She is on dysphagia 3 diet Plan discussed with [name], daughter ([facility] [place]).  She confirms that she prefers medical therapy to surgical therapy. PT recommends discharge to SNF.  Awaiting placement pending insurance authorization   Body mass index is 20.7 kg/m.  Diet Order            DIET DYS 3 Room service appropriate? Yes; Fluid consistency: Thin  Diet effective now                       Medications:   .  stroke: mapping our early stages of recovery book   Does not apply Once  . aspirin  325 mg Oral Daily  . atorvastatin  80 mg Oral q1800  . clopidogrel  75 mg Oral Daily  . donepezil  10 mg Oral QHS  . enoxaparin (LOVENOX) injection  40 mg Subcutaneous Daily   Continuous  Infusions:    Anti-infectives (From admission, onward)   None             Family Communication/Anticipated D/C date and plan/Code Status   DVT prophylaxis: enoxaparin (LOVENOX) injection 40 mg Start: 01/27/20 1200 SCD's Start: 01/26/20 0132     Code Status: Full Code  Family Communication: Plan discussed with patient Disposition Plan:    Status is: Inpatient  Remains inpatient appropriate because:Unsafe d/c plan and Inpatient level of care appropriate due to severity of illness   Dispo: The patient is from: Home              Anticipated d/c is to: SNF              Anticipated d/c date is: 1 day              Patient currently is medically stable to d/c.0           Subjective:   No complaints.  She feels better today.   Objective:    Vitals:   01/29/20 0600 01/29/20 0640 01/29/20 0725 01/29/20 1115  BP:   (!) 143/86 131/62  Pulse:   75 72  Resp: 18 (!) 23 17 18   Temp:   97.7 F (36.5 C) 98 F (36.7 C)  TempSrc:   Oral   SpO2:   98% 98%  Weight:      Height:  No data found.   Intake/Output Summary (Last 24 hours) at 01/29/2020 1341 Last data filed at 01/29/2020 6387 Gross per 24 hour  Intake 480 ml  Output 300 ml  Net 180 ml   Filed Weights   01/27/20 0635 01/28/20 0429 01/29/20 0445  Weight: 54 kg 53.9 kg 53 kg    Exam:  GEN: NAD SKIN: Warm and dry EYES: No pallor or icterus ENT: MMM CV: RRR PULM: Clear to auscultation bilaterally ABD: soft, ND, NT, +BS CNS: AAO x 3, nonfocal EXT: No edema or tenderness   Data Reviewed:   I have personally reviewed following labs and imaging studies:  Labs: Labs show the following:   Basic Metabolic Panel: Recent Labs  Lab 01/25/20 1152  NA 138  K 4.3  CL 105  CO2 24  GLUCOSE 97  BUN 15  CREATININE 0.70  CALCIUM 9.1   GFR Estimated Creatinine Clearance: 43.3 mL/min (by C-G formula based on SCr of 0.7 mg/dL). Liver Function Tests: Recent Labs  Lab 01/25/20 1152   AST 21  ALT 14  ALKPHOS 72  BILITOT 0.9  PROT 7.9  ALBUMIN 4.2   No results for input(s): LIPASE, AMYLASE in the last 168 hours. No results for input(s): AMMONIA in the last 168 hours. Coagulation profile Recent Labs  Lab 01/25/20 1152  INR 1.0    CBC: Recent Labs  Lab 01/25/20 1152  WBC 5.5  NEUTROABS 3.5  HGB 12.4  HCT 36.1  MCV 100.6*  PLT 316   Cardiac Enzymes: No results for input(s): CKTOTAL, CKMB, CKMBINDEX, TROPONINI in the last 168 hours. BNP (last 3 results) No results for input(s): PROBNP in the last 8760 hours. CBG: No results for input(s): GLUCAP in the last 168 hours. D-Dimer: No results for input(s): DDIMER in the last 72 hours. Hgb A1c: No results for input(s): HGBA1C in the last 72 hours. Lipid Profile: No results for input(s): CHOL, HDL, LDLCALC, TRIG, CHOLHDL, LDLDIRECT in the last 72 hours. Thyroid function studies: No results for input(s): TSH, T4TOTAL, T3FREE, THYROIDAB in the last 72 hours.  Invalid input(s): FREET3 Anemia work up: No results for input(s): VITAMINB12, FOLATE, FERRITIN, TIBC, IRON, RETICCTPCT in the last 72 hours. Sepsis Labs: Recent Labs  Lab 01/25/20 1152  WBC 5.5    Microbiology Recent Results (from the past 240 hour(s))  SARS Coronavirus 2 by RT PCR (hospital order, performed in [facility] hospital lab) Nasopharyngeal Nasopharyngeal Swab     Status: None   Collection Time: 01/26/20  3:50 PM   Specimen: Nasopharyngeal Swab  Result Value Ref Range Status   SARS Coronavirus 2 NEGATIVE NEGATIVE Final    Comment: (NOTE) SARS-CoV-2 target nucleic acids are NOT DETECTED.  The SARS-CoV-2 RNA is generally detectable in upper and lower respiratory specimens during the acute phase of infection. The lowest concentration of SARS-CoV-2 viral copies this assay can detect is 250 copies / mL. A negative result does not preclude SARS-CoV-2 infection and should not be used as the sole basis for treatment or other patient  management decisions.  A negative result may occur with improper specimen collection / handling, submission of specimen other than nasopharyngeal swab, presence of viral mutation(s) within the areas targeted by this assay, and inadequate number of viral copies (<250 copies / mL). A negative result must be combined with clinical observations, patient history, and epidemiological information.  Fact Sheet for Patients:   BoilerBrush.com.cy  Fact Sheet for Healthcare Providers: https://pope.com/  This test is not yet approved or  cleared by the [place] and has been authorized for detection and/or diagnosis of SARS-CoV-2 by FDA under an Emergency Use Authorization (EUA).  This EUA will remain in effect (meaning this test can be used) for the duration of the COVID-19 declaration under Section 564(b)(1) of the Act, 21 U.S.C. section 360bbb-3(b)(1), unless the authorization is terminated or revoked sooner.  Performed at [facility], [address] [place], [place] [address]     Procedures and diagnostic studies:  No results found.             LOS: 3 days   Lurene Shadow  [facility]   Pager [phone]. If 7PM-7AM, please contact night-coverage at [email or web address]     01/29/2020, 1:41 PM

## 2020-01-29 NOTE — Evaluation (Addendum)
Speech Language Pathology Evaluation Patient Details Name: Gabriella Rogers MRN: [id number] DOB: 04/25/1934 Today's Date: 01/29/2020 Time: 6948-5462 SLP Time Calculation (min) (ACUTE ONLY): 25 min  Problem List:  Patient Active Problem List   Diagnosis Date Noted   Acute CVA (cerebrovascular accident) (HCC) 01/26/2020   Mild cognitive impairment 01/26/2020   Depression 11/26/2015   Dizziness 11/26/2015   Hyperlipidemia, unspecified 11/26/2015   Headache, migraine 11/26/2015   Osteoporosis, post-menopausal 11/26/2015   H/O total knee replacement 11/23/2015   Arthropathy, traumatic, knee 03/05/2015   Brain disorder 03/27/2014   Cervical dystonia 01/24/2012   Past Medical History:  Past Medical History:  Diagnosis Date   Brain disorder 03/27/2014   Cervical dystonia 01/24/2012   Clinical depression 11/26/2015   Dizziness 11/26/2015   Overview:  Chronic, felt secondary to inner ear per ENT evaluation approximately 1996.    H/O total knee replacement 11/23/2015   Headache, migraine 11/26/2015   HLD (hyperlipidemia) 11/26/2015   Hypercholesteremia    Osteoporosis, post-menopausal 11/26/2015   Past Surgical History:  Past Surgical History:  Procedure Laterality Date   ABDOMINAL HYSTERECTOMY     ANKLE SURGERY Left    KNEE SURGERY Right    HPI:  Pt is an 86 y.o. female presenting to hospital 8/6 with R hand numbness, pain behind L eye, difficulty walking, and slurred speech; onset 2 days prior (pt refused EMS transport 1st day).  MRI of brain showing B acute pontine infarcts (L slightly greater than R).  PMH includes brain disorder, cervical dystonia, dizziness, h/o TKR, L ankle surgery, per chart also dementia.   Assessment / Plan / Recommendation Clinical Impression  At baseline, pt reports that she is independent with medication management, drives and helps with household chores. She uses a calendar to organize appointments. At this time, pt's cognitive abilities are  not sufficient to return these reported activities. During this evaluation, pt has difficulty with selective attention, memory (immediate, short-term recall and retrieval), problem solving basic tasks, fluctuates from topic with no self-awareness and she reports increased difficulty with "saying words." Suspect that word-finding deficits are related to memory deficits as they are intermittent in nature. At this time, recommend follow up ST services at SNF to increase functional independence and reduce caregiver burden.   Pt is currently on dysphagia 3 diet. Pt was eating breakfast during this cognitive linguistic evaluation. Current diet remains appropriate as she is unable to cut meats d/t RUE weakness.     SLP Assessment  SLP Recommendation/Assessment: All further Speech Lanaguage Pathology  needs can be addressed in the next venue of care SLP Visit Diagnosis: Cognitive communication deficit (R41.841)    Follow Up Recommendations  Skilled Nursing facility    Frequency and Duration   N/A        SLP Evaluation Cognition  Overall Cognitive Status: No family/caregiver present to determine baseline cognitive functioning Arousal/Alertness: Awake/alert Orientation Level: Oriented X4 Attention: Selective Selective Attention: Impaired Selective Attention Impairment: Verbal complex;Functional complex Memory: Impaired Memory Impairment: Storage deficit;Retrieval deficit;Decreased recall of new information;Decreased short term memory Decreased Short Term Memory: Verbal basic;Functional basic Awareness: Impaired Awareness Impairment: Emergent impairment Problem Solving: Impaired Problem Solving Impairment: Verbal complex;Functional complex Executive Function:  (All impaired by lower level deficits) Behaviors: Poor frustration tolerance Safety/Judgment: Impaired       Comprehension  Auditory Comprehension Overall Auditory Comprehension: Appears within functional limits for tasks  assessed Visual Recognition/Discrimination Discrimination: Within Function Limits Reading Comprehension Reading Status: Not tested    Expression Expression Primary Mode  of Expression: Verbal Verbal Expression Overall Verbal Expression: Appears within functional limits for tasks assessed Written Expression Dominant Hand: Left Written Expression: Not tested   Oral / Motor  Oral Motor/Sensory Function Overall Oral Motor/Sensory Function: Within functional limits Motor Speech Overall Motor Speech: Appears within functional limits for tasks assessed   GO                   Margreat Widener B. Dreama Saa M.S., CCC-SLP, [facility] Speech-Language Pathologist Rehabilitation Services Office [phone]  Reuel Derby 01/29/2020, 12:08 PM

## 2020-01-29 NOTE — Progress Notes (Addendum)
1650-   Patient called- reports she is concern about the bruise on her belly.   Per pt- she wants to speak to a MD, she does not think it is right.   Assessed area- noted a moderate size bruise on abdomen and a smaller bruise below it from this morning's lovenox shot.   Area is tender and slightly hard to the touch.   Explained to patient the lovenox shot can cause bruising and that we will continue to monitor.  Patient stated we were trying to kill her and repeatedly asked why we were giving the shot.  Tried to explain importance of blood thinner.  She was very adamant about talking to a MD  Messaged MD   [phone]  -  Not long after assessment patient called out again- reported she was having trouble swallowing.  Assessed vitals, neuro check and patient ability to swallow.  Patient was able to swallow a small amount of apple sauce, a bite of mac-n-cheese and then some medications without choking/coughing.   Patient continues to be very anxious about bruising.  Gave patient some tylenol and klonopin for pain and anxiety.    MD called and spoke to patient.

## 2020-01-29 NOTE — Progress Notes (Signed)
Physical Therapy Treatment Patient Details Name: Gabriella Rogers MRN: [id number] DOB: October 09, 1933 Today's Date: 01/29/2020    History of Present Illness Pt is an 86 y.o. female presenting to hospital 8/6 with R hand numbness, pain behind L eye, difficulty walking, and slurred speech; onset 2 days prior (pt refused EMS transport 1st day).  MRI of brain showing B acute pontine infarcts (L slightly greater than R).  PMH includes brain disorder, cervical dystonia, dizziness, h/o TKR, L ankle surgery, per chart also dementia.    PT Comments    Pt was long sitting in bed with RN performing neuro check. She is alert and agreeable to PT session. Pt very pleasant and motivated to participate. Does endorse R knee pain that increased with ambulation. Pt was able to exit R side of bed and ambulate to doorway but requires assistance  With all mobility/transfers/ gait for safety. Significant other was in room and both pt/pt's support agreeable to rehab at DC. Pt will benefit from continued skilled PT to address deficits and to assist pt to PLOF. She was in bed at conclusion of session with bed alarm in place and RN aware of pt's abilities.     Follow Up Recommendations  SNF     Equipment Recommendations  Rolling walker with 5" wheels;3in1 (PT)    Recommendations for Other Services       Precautions / Restrictions Precautions Precautions: Fall Restrictions Weight Bearing Restrictions: No    Mobility  Bed Mobility Overal bed mobility: Needs Assistance Bed Mobility: Supine to Sit;Sit to Supine     Supine to sit: Min assist (min assist from flat bed ) Sit to supine: Min assist   General bed mobility comments: pt requires min assist to exit and return to bed. max vcs for technique and sequencing  Transfers Overall transfer level: Needs assistance Equipment used: Rolling walker (2 wheeled) Transfers: Sit to/from Stand Sit to Stand: Min assist         General transfer comment: Min assist  for safety with moderate/max vcs for improved technique.  Ambulation/Gait Ambulation/Gait assistance: Min assist Gait Distance (Feet): 25 Feet Assistive device: Rolling walker (2 wheeled) Gait Pattern/deviations: Trunk flexed;Step-to pattern Gait velocity: decreased   General Gait Details: Pt was able to ambulate to doorway and return to bed. has unsteadiness and c/o increased R knee pain. pt is high fall risk   Stairs             Wheelchair Mobility    Modified Rankin (Stroke Patients Only)       Balance Overall balance assessment: Needs assistance Sitting-balance support: No upper extremity supported;Feet supported Sitting balance-Leahy Scale: Good Sitting balance - Comments: pt is unsteady with reaching outside of BOS.   Standing balance support: Bilateral upper extremity supported;During functional activity Standing balance-Leahy Scale: Poor Standing balance comment: high fall risk with dynamic balance                            Cognition Arousal/Alertness: Awake/alert Behavior During Therapy: WFL for tasks assessed/performed;Impulsive;Anxious Overall Cognitive Status: Within Functional Limits for tasks assessed                                 General Comments: Pt isA and O x 3 and agreeable and motivated for PT session      Exercises      General Comments  Pertinent Vitals/Pain Pain Assessment:  (c/o tingling in LEs / R knee pain but did not rate)    Home Living       Type of Home: House              Prior Function            PT Goals (current goals can now be found in the care plan section) Acute Rehab PT Goals Patient Stated Goal: to return to PLOF Progress towards PT goals: Progressing toward goals    Frequency    7X/week      PT Plan Current plan remains appropriate    Co-evaluation              AM-PAC PT "6 Clicks" Mobility   Outcome Measure  Help needed turning from your back to  your side while in a flat bed without using bedrails?: None Help needed moving from lying on your back to sitting on the side of a flat bed without using bedrails?: A Little Help needed moving to and from a bed to a chair (including a wheelchair)?: A Lot Help needed standing up from a chair using your arms (e.g., wheelchair or bedside chair)?: A Lot Help needed to walk in hospital room?: A Lot Help needed climbing 3-5 steps with a railing? : A Lot 6 Click Score: 15    End of Session Equipment Utilized During Treatment: Gait belt Activity Tolerance: Patient tolerated treatment well Patient left: in bed;with call bell/phone within reach;with bed alarm set Nurse Communication: Mobility status;Precautions;Other (comment) PT Visit Diagnosis: Unsteadiness on feet (R26.81);Other abnormalities of gait and mobility (R26.89);Muscle weakness (generalized) (M62.81);History of falling (Z91.81);Difficulty in walking, not elsewhere classified (R26.2)     Time: 1050-1110 PT Time Calculation (min) (ACUTE ONLY): 20 min  Charges:  $Neuromuscular Re-education: 8-22 mins                     Jetta Lout PTA 01/29/20, 1:05 PM

## 2020-01-30 NOTE — Progress Notes (Signed)
Occupational Therapy Treatment Patient Details Name: Gabriella Rogers MRN: [id number] DOB: May 26, 1934 Today's Date: 01/30/2020    History of present illness Pt is an 86 y.o. female presenting to hospital 8/6 with R hand numbness, pain behind L eye, difficulty walking, and slurred speech; onset 2 days prior (pt refused EMS transport 1st day).  MRI of brain showing B acute pontine infarcts (L slightly greater than R).  PMH includes brain disorder, cervical dystonia, dizziness, h/o TKR, L ankle surgery, per chart also dementia.   OT comments  Pt seen for OT tx this date to f/u re: safety with ADLs/ADL mobility. Pt requires MIN A for ADL transfers, MIN A with fxl mobility from chair to sink on opposite side of room. OT assists with transfer to [facility] 1/2 way to sink, with MIN A and pt performs peri care after voiding with CGA for sit/lateral lean. Pt completes fxl mobility to sink and stands ~14-15 mins while completing oral care, washing face and brushing hair. Pt tolerates well, intermittently/regularly with R lateral lean t/o task, pt is aware, but ~80% of the time when leaning, requires verbal or tactile cue to right. Pt does become fatigued and requests to get back to bed at end of session, OT faciltiates ed re: safe use of RW for backing up to bed and reach back for improved eccentric control for stand to sit. Pt back to bed with SUPV. Overall tolerated well. Continue to recommend SNF as safest d/c recommendation at this time. Of note: pt appears somewhat down this session, she is hard on herself throughout requiring gentle cueing to re-direct. Might benefit from pastoral care, RN notified of pt's expressions during OT session.    Follow Up Recommendations  SNF    Equipment Recommendations  3 in 1 bedside commode;Other (comment) (2WW)    Recommendations for Other Services      Precautions / Restrictions Precautions Precautions: Fall Restrictions Weight Bearing Restrictions: No       Mobility  Bed Mobility Overal bed mobility: Needs Assistance Bed Mobility: Sit to Supine       Sit to supine: Min guard      Transfers Overall transfer level: Needs assistance Equipment used: Rolling walker (2 wheeled) Transfers: Sit to/from Stand Sit to Stand: Min assist         General transfer comment: MIN verbal cues for safe hand placement.    Balance Overall balance assessment: Needs assistance Sitting-balance support: No upper extremity supported;Feet supported Sitting balance-Leahy Scale: Good Sitting balance - Comments: no LOB seated EOB Postural control: Right lateral lean Standing balance support: Bilateral upper extremity supported;During functional activity Standing balance-Leahy Scale: Fair Standing balance comment: requires BUE support to maintain standing balance                           ADL either performed or assessed with clinical judgement   ADL Overall ADL's : Needs assistance/impaired     Grooming: Minimal assistance;Standing;Oral care;Wash/dry hands Grooming Details (indicate cue type and reason): MIN A to sustain static balance sink side while pt completed oral care and brushed hair. Pt with intermittent bouts of R lateral lean t/o task, Pt with awareness, but limited righting response, requires tactile/verbal cues to attend and correct.                 Toilet Transfer: Minimal assistance;Ambulation;RW;BSC   Toileting- Clothing Manipulation and Hygiene: Minimal assistance;Sitting/lateral lean Toileting - Clothing Manipulation Details (indicate cue type  and reason): on BSC with b/l hand rails, pt with less R lateral lean while performing peri care with L UE after voiding.     Functional mobility during ADLs: Minimal assistance;Rolling walker (Pt continues to demo R lateral lean, able to correct briefly with cues, somewhat aware, but limited indepdnent righting noted throughout)       Vision Patient Visual Report: No change from  baseline     Perception     Praxis      Cognition Arousal/Alertness: Awake/alert Behavior During Therapy: WFL for tasks assessed/performed;Anxious Overall Cognitive Status: Within Functional Limits for tasks assessed                                 General Comments: pt oriented, but repeats some topics several times during tx, some decreased problem solving/decision making.        Exercises Other Exercises Other Exercises: OT facilitates education re: safety with ADLs, safe use of AD for fxl mobility, hand placement for transfers, fall prevention, righting standing balance (mirror used to help pt with awareness of R lateral lean).   Shoulder Instructions       General Comments pt reports feeling her belly is somewhat bloated which isn't normal for her.    Pertinent Vitals/ Pain       Pain Assessment: No/denies pain  Home Living                                          Prior Functioning/Environment              Frequency  Min 2X/week        Progress Toward Goals  OT Goals(current goals can now be found in the care plan section)  Progress towards OT goals: Progressing toward goals  Acute Rehab OT Goals Patient Stated Goal: to return to PLOF OT Goal Formulation: With patient Time For Goal Achievement: 02/10/20 Potential to Achieve Goals: Good  Plan Discharge plan remains appropriate    Co-evaluation                 AM-PAC OT "6 Clicks" Daily Activity     Outcome Measure   Help from another person eating meals?: A Little Help from another person taking care of personal grooming?: A Little Help from another person toileting, which includes using toliet, bedpan, or urinal?: A Little Help from another person bathing (including washing, rinsing, drying)?: A Lot Help from another person to put on and taking off regular upper body clothing?: A Little Help from another person to put on and taking off regular lower body  clothing?: A Lot 6 Click Score: 16    End of Session Equipment Utilized During Treatment: Gait belt;Rolling walker  OT Visit Diagnosis: Unsteadiness on feet (R26.81);Other abnormalities of gait and mobility (R26.89);Muscle weakness (generalized) (M62.81);Hemiplegia and hemiparesis Hemiplegia - Right/Left: Right Hemiplegia - caused by: Cerebral infarction   Activity Tolerance Patient tolerated treatment well   Patient Left in bed;with call bell/phone within reach;with bed alarm set   Nurse Communication Mobility status        Time: 5638-7564 OT Time Calculation (min): 56 min  Charges: OT General Charges $OT Visit: 1 Visit OT Treatments $Self Care/Home Management : 38-52 mins $Therapeutic Activity: 8-22 mins  Rejeana Brock, MS, OTR/L ascom [phone] 01/30/20, 4:07 PM

## 2020-01-30 NOTE — Progress Notes (Signed)
Physical Therapy Treatment Patient Details Name: Gabriella Rogers MRN: [id number] DOB: 10-08-1933 Today's Date: 01/30/2020    History of Present Illness Pt is an 86 y.o. female presenting to hospital 8/6 with R hand numbness, pain behind L eye, difficulty walking, and slurred speech; onset 2 days prior (pt refused EMS transport 1st day).  MRI of brain showing B acute pontine infarcts (L slightly greater than R).  PMH includes brain disorder, cervical dystonia, dizziness, h/o TKR, L ankle surgery, per chart also dementia.    PT Comments    Pt was long sitting in bed upon arriving. More oriented than previously observed earlier in the day. She is alert throughout but did require minimal re orientation on situation and POC going forward. She agrees to OOB activity and was able to exit R side of bed with min assist. Stood with CGA and ambulated to RN station and return. HR and sao2 stable throughout. Pt does have deficits with balance and safe functional mobility. This along with poor safety awareness makes her a high fall risk. She will benefit from continued skilled PT at DC to address these deficits and assist pt to PLOF.     Follow Up Recommendations  SNF     Equipment Recommendations  Rolling walker with 5" wheels;3in1 (PT)    Recommendations for Other Services       Precautions / Restrictions Precautions Precautions: Fall Restrictions Weight Bearing Restrictions: No    Mobility  Bed Mobility Overal bed mobility: Needs Assistance Bed Mobility: Supine to Sit (HOB elevated)     Supine to sit: Min assist     General bed mobility comments: Min assist to progress from long sitting in bed to EOB short sit  Transfers Overall transfer level: Needs assistance Equipment used: Rolling walker (2 wheeled) Transfers: Sit to/from Stand Sit to Stand: Min assist         General transfer comment: Min assist to safely STS from lowest bed height. CGA from elevated  surface.  Ambulation/Gait Ambulation/Gait assistance: Min guard Gait Distance (Feet): 100 Feet Assistive device: Rolling walker (2 wheeled) Gait Pattern/deviations: Trunk flexed;Step-to pattern;Narrow base of support;Staggering right Gait velocity: decreased   General Gait Details: pt ambulated to RN station with use of RW and CGA. occasional MIn assist during turning for safety   Stairs             Wheelchair Mobility    Modified Rankin (Stroke Patients Only)       Balance Overall balance assessment: Needs assistance Sitting-balance support: No upper extremity supported;Feet supported Sitting balance-Leahy Scale: Good Sitting balance - Comments: no LOB seated EOB   Standing balance support: Bilateral upper extremity supported;During functional activity Standing balance-Leahy Scale: Fair Standing balance comment: requires BUE support to maintain standing balance                            Cognition Arousal/Alertness: Awake/alert Behavior During Therapy: WFL for tasks assessed/performed;Anxious Overall Cognitive Status: Within Functional Limits for tasks assessed                                 General Comments: pt was alert but does present with cognition deficits. Oriented to self and able to follow commands well but needs reorientation.       Exercises      General Comments        Pertinent Vitals/Pain Pain  Assessment: No/denies pain    Home Living                      Prior Function            PT Goals (current goals can now be found in the care plan section) Acute Rehab PT Goals Patient Stated Goal: to return to PLOF    Frequency    7X/week      PT Plan Current plan remains appropriate    Co-evaluation              AM-PAC PT "6 Clicks" Mobility   Outcome Measure  Help needed turning from your back to your side while in a flat bed without using bedrails?: None Help needed moving from lying on  your back to sitting on the side of a flat bed without using bedrails?: A Little Help needed moving to and from a bed to a chair (including a wheelchair)?: A Little Help needed standing up from a chair using your arms (e.g., wheelchair or bedside chair)?: A Little Help needed to walk in hospital room?: A Little Help needed climbing 3-5 steps with a railing? : A Lot 6 Click Score: 18    End of Session Equipment Utilized During Treatment: Gait belt Activity Tolerance: Patient tolerated treatment well Patient left: in chair;with call bell/phone within reach;with chair alarm set Nurse Communication: Mobility status;Precautions;Other (comment) PT Visit Diagnosis: Unsteadiness on feet (R26.81);Other abnormalities of gait and mobility (R26.89);Muscle weakness (generalized) (M62.81);History of falling (Z91.81);Difficulty in walking, not elsewhere classified (R26.2)     Time: 1093-2355 PT Time Calculation (min) (ACUTE ONLY): 24 min  Charges:  $Gait Training: 8-22 mins $Therapeutic Activity: 8-22 mins                     Jetta Lout PTA 01/30/20, 1:01 PM

## 2020-01-30 NOTE — Plan of Care (Signed)
  Problem: Education: Goal: Knowledge of disease or condition will improve Outcome: Progressing Goal: Knowledge of secondary prevention will improve Outcome: Progressing Goal: Knowledge of patient specific risk factors addressed and post discharge goals established will improve Outcome: Progressing Goal: Individualized Educational Video(s) Outcome: Progressing   Problem: Ischemic Stroke/TIA Tissue Perfusion: Goal: Complications of ischemic stroke/TIA will be minimized Outcome: Progressing   Problem: Education: Goal: Knowledge of General Education information will improve Description: Including pain rating scale, medication(s)/side effects and non-pharmacologic comfort measures Outcome: Progressing   Problem: Health Behavior/Discharge Planning: Goal: Ability to manage health-related needs will improve Outcome: Progressing   Problem: Nutrition: Goal: Adequate nutrition will be maintained Outcome: Progressing

## 2020-01-30 NOTE — Care Management Important Message (Signed)
Important Message  Patient Details  Name: Gabriella Rogers MRN: [id number] Date of Birth: 01-Dec-1933   Medicare Important Message Given:  Yes     Bernadette Hoit 01/30/2020, 11:18 AM

## 2020-01-30 NOTE — Progress Notes (Signed)
PT Cancellation Note  Patient Details Name: Gabriella Rogers MRN: [id number] DOB: 1933/07/02   Cancelled Treatment:     PT attempt. Upon entering room, pt was eating breakfast. She is talkative however presents more confused than from previous date. Therapist re-oriented pt to time, setting, and discussed reason for being in hospital. She reports pain in L eye. Unwilling to participate at this time requesting therapist return after lunch.    Rushie Chestnut 01/30/2020, 10:19 AM

## 2020-01-30 NOTE — Progress Notes (Signed)
PROGRESS NOTE  Gabriella Rogers GBT:[id number] DOB: [birth year] DOA: 01/25/2020 PCP: Corky Downs, MD  Brief History   The patient is a 86 yr old woman who presented on 01/25/2020 with complaints of dysphagia and word finding difficulty. She was found to have high-grade mid-basilar artery stenosis, hi-grade proximal A2 right anterior cerebral artery stenosis and moderate stenosis within the distal M1 right MCA. The plan is for her to discharge on DAPT (Plavix and ASA) for 3 months to be followed by ASA monotherapy. She is to continue lipitor.  She will require repeat CT head and CTA head if she has a change in mental status.  The patient is awating placement at [facility].  Consultants  . Neurology  Procedures  . None  Antibiotics   Anti-infectives (From admission, onward)   None    .  Subjective  The patient is sitting up in bed. No new complaints.  Objective   Vitals:  Vitals:   01/30/20 1138 01/30/20 1521  BP: (!) 112/56 121/64  Pulse: 67 70  Resp: 18 18  Temp: 98.2 F (36.8 C) 97.9 F (36.6 C)  SpO2: 96% 96%   Exam:  Constitutional:  . The patient is awake, alert, and oriented x 3. No acute distress. Respiratory:  . No increased work of breathing. . No wheezes, rales, or rhonchi . No tactile fremitus Cardiovascular:  . Regular rate and rhythm . No murmurs, ectopy, or gallups. . No lateral PMI. No thrills. Abdomen:  . Abdomen is soft, non-tender, non-distended . No hernias, masses, or organomegaly . Normoactive bowel sounds.  Musculoskeletal:  . No cyanosis, clubbing, or edema Skin:  . No rashes, lesions, ulcers . palpation of skin: no induration or nodules Neurologic:  . CN 2-12 intact . Sensation all 4 extremities intact Psychiatric:  . Mental status o Mood, affect appropriate o Orientation to person, place, time  . judgment and insight appear intact  I have personally reviewed the following:   Today's Data  . Vitals  Imaging  . MRI brain, CT head,  CTA head and neck . Carotid doppler  Cardiology Data  . Echocardiogram.  Scheduled Meds: .  stroke: mapping our early stages of recovery book   Does not apply Once  . aspirin  325 mg Oral Daily  . atorvastatin  80 mg Oral q1800  . clopidogrel  75 mg Oral Daily  . donepezil  10 mg Oral QHS  . enoxaparin (LOVENOX) injection  40 mg Subcutaneous Daily   Continuous Infusions:  Principal Problem:   Acute CVA (cerebrovascular accident) (HCC) Active Problems:   Cervical dystonia   Mild cognitive impairment   LOS: 4 days   A & P  Acute bilateral pontine stroke: CTA head and neck demonstrated:  High-grade mid basilar artery stenosis, high-grade proximal A2 right anterior cerebral artery stenosis and moderate stenosis within the distal M1 right middle cerebral artery. No intracardiac thrombus noted on echocardiogram.She will be discharged on DAPT (ASA and Plavix) to be continued for 3 months followed by ASA monotherapy.  Repeat CT head and CTA head if there is significant change in mental status. Per neurologist, patient's daughter has opted for medical management rather than surgical treatment or stent placement for high-grade basilar artery stenosis.  PT has recommended SNF placement for rehab. She is awaiting placement in rehab facility.  Dysphagia: SLP has placed the patient on a dysphagia 3 diet. They recommend continuation of SLP services when the patient goes to rehab.  Orthostatic hypotension: Resolved. IV fluids  have been discontinued.   Essential Hypertension: Blood pressures are currently well controlled without antihypertensive medication. Monitor.  Hyperlipidemia (total cholesterol 274, LDL 195, HDL 66, triglycerides 65): Continue Lipitor as previous.  Mild cognitive dysfunction: Noted. Continue Aricept.   I have seen and examined this patient myself. I have spent 34 minutes in her evaluation and care.  DVT Prophylaxis: Lovenox CODE STATUS: Full Code Family Communication:  Daughter at bedside Disposition:  Status is: Inpatient  Remains inpatient appropriate because:Unsafe d/c plan   Dispo: The patient is from: Home              Anticipated d/c is to: SNF              Anticipated d/c date is: 1 day              Patient currently medically stable to discharge.  Shanina Kepple, DO [facility] Direct contact: see [email or web address]  7PM-7AM contact night coverage as above 01/30/2020, 6:32 PM  LOS: 4 days

## 2020-01-31 LAB — CBC
HCT: 38.5 % (ref 36.0–46.0)
Hemoglobin: 13.3 g/dL (ref 12.0–15.0)
MCH: 35 pg — ABNORMAL HIGH (ref 26.0–34.0)
MCHC: 34.5 g/dL (ref 30.0–36.0)
MCV: 101.3 fL — ABNORMAL HIGH (ref 80.0–100.0)
Platelets: 328 10*3/uL (ref 150–400)
RBC: 3.8 MIL/uL — ABNORMAL LOW (ref 3.87–5.11)
RDW: 13.3 % (ref 11.5–15.5)
WBC: 6.9 10*3/uL (ref 4.0–10.5)
nRBC: 0 % (ref 0.0–0.2)

## 2020-01-31 LAB — BASIC METABOLIC PANEL
Anion gap: 9 (ref 5–15)
BUN: 22 mg/dL (ref 8–23)
CO2: 22 mmol/L (ref 22–32)
Calcium: 9.2 mg/dL (ref 8.9–10.3)
Chloride: 108 mmol/L (ref 98–111)
Creatinine, Ser: 0.83 mg/dL (ref 0.44–1.00)
GFR calc Af Amer: >60 mL/min (ref >60)
GFR calc non Af Amer: >60 mL/min (ref >60)
Glucose, Bld: 82 mg/dL (ref 70–99)
Potassium: 3.9 mmol/L (ref 3.5–5.1)
Sodium: 139 mmol/L (ref 135–145)

## 2020-01-31 NOTE — TOC Progression Note (Signed)
Transition of Care [facility]) - Progression Note    Patient Details  Name: Gabriella Rogers MRN: [id number] Date of Birth: June 30, 1933  Transition of Care [facility]) CM/SW Contact  Shawn Route, RN Phone Number: 01/31/2020, 1:36 PM  Clinical Narrative:    Spoke with Admissions Coordinator at [facility].  He is still waiting on insurance authorization from [employer].  [employer]'s online tracking system still stated that is was under review.   Expected Discharge Plan: Skilled Nursing Facility Barriers to Discharge: No Barriers Identified  Expected Discharge Plan and Services Expected Discharge Plan: Skilled Nursing Facility In-house Referral: Clinical Social Work     Living arrangements for the past 2 months: Single Family Home                                       Social Determinants of Health (SDOH) Interventions    Readmission Risk Interventions No flowsheet data found.

## 2020-01-31 NOTE — Progress Notes (Signed)
PROGRESS NOTE  Gabriella Rogers CZY:[id number] DOB: [birth year] DOA: 01/25/2020 PCP: Corky Downs, MD  Brief History   The patient is a 86 yr old woman who presented on 01/25/2020 with complaints of dysphagia and word finding difficulty. She was found to have high-grade mid-basilar artery stenosis, hi-grade proximal A2 right anterior cerebral artery stenosis and moderate stenosis within the distal M1 right MCA. The plan is for her to discharge on DAPT (Plavix and ASA) for 3 months to be followed by ASA monotherapy. She is to continue lipitor.  She will require repeat CT head and CTA head if she has a change in mental status.  The patient is Oncologist for placement at [facility].  Consultants  . Neurology  Procedures  . None  Antibiotics   Anti-infectives (From admission, onward)   None     Subjective  The patient is sitting up in bed. No new complaints. Her son is at bedside.  Objective   Vitals:  Vitals:   01/31/20 0727 01/31/20 1601  BP: (!) 168/83 127/74  Pulse: 64 66  Resp: 17 18  Temp: 97.6 F (36.4 C) 97.9 F (36.6 C)  SpO2: 100% 96%   Exam:  Constitutional:  . The patient is awake, alert, and oriented x 3. No acute distress. Respiratory:  . No increased work of breathing. . No wheezes, rales, or rhonchi . No tactile fremitus Cardiovascular:  . Regular rate and rhythm . No murmurs, ectopy, or gallups. . No lateral PMI. No thrills. Abdomen:  . Abdomen is soft, non-tender, non-distended . No hernias, masses, or organomegaly . Normoactive bowel sounds.  Musculoskeletal:  . No cyanosis, clubbing, or edema Skin:  . No rashes, lesions, ulcers . palpation of skin: no induration or nodules Neurologic:  . CN 2-12 intact . Sensation all 4 extremities intact Psychiatric:  . Mental status o Mood, affect appropriate o Orientation to person, place, time  . judgment and insight appear intact  I have personally reviewed the following:   Today's  Data  . Vitals  Imaging  . MRI brain, CT head, CTA head and neck . Carotid doppler  Cardiology Data  . Echocardiogram.  Scheduled Meds: .  stroke: mapping our early stages of recovery book   Does not apply Once  . aspirin  325 mg Oral Daily  . atorvastatin  80 mg Oral q1800  . clopidogrel  75 mg Oral Daily  . donepezil  10 mg Oral QHS  . enoxaparin (LOVENOX) injection  40 mg Subcutaneous Daily   Continuous Infusions:  Principal Problem:   Acute CVA (cerebrovascular accident) (HCC) Active Problems:   Cervical dystonia   Mild cognitive impairment   LOS: 5 days   A & P  Acute bilateral pontine stroke: CTA head and neck demonstrated:  High-grade mid basilar artery stenosis, high-grade proximal A2 right anterior cerebral artery stenosis and moderate stenosis within the distal M1 right middle cerebral artery. No intracardiac thrombus noted on echocardiogram.She will be discharged on DAPT (ASA and Plavix) to be continued for 3 months followed by ASA monotherapy.  Repeat CT head and CTA head if there is significant change in mental status. Per neurologist, patient's daughter has opted for medical management rather than surgical treatment or stent placement for high-grade basilar artery stenosis.  PT has recommended SNF placement for rehab. She is awaiting insurance authorization for placement in rehab facility.  Dysphagia: SLP has placed the patient on a dysphagia 3 diet. They recommend continuation of SLP services when the  patient goes to rehab.  Orthostatic hypotension: Resolved. IV fluids have been discontinued.   Essential Hypertension: Blood pressures are currently well controlled without antihypertensive medication. Monitor.  Hyperlipidemia (total cholesterol 274, LDL 195, HDL 66, triglycerides 65): Continue Lipitor as previous.  Mild cognitive dysfunction: Noted. Continue Aricept.   I have seen and examined this patient myself. I have spent 32 minutes in her evaluation and  care.  DVT Prophylaxis: Lovenox CODE STATUS: Full Code Family Communication: Son at bedside Disposition: SNF Status is: Inpatient  Remains inpatient appropriate because:Unsafe d/c plan   Dispo: The patient is from: Home              Anticipated d/c is to: SNF              Anticipated d/c date is: 1 day              Patient currently medically stable to discharge. Awaiting safe discharge.  [name], DO [facility] Direct contact: see [email or web address]  7PM-7AM contact night coverage as above 01/31/2020, 5:07 PM  LOS: 4 days

## 2020-01-31 NOTE — Progress Notes (Signed)
Physical Therapy Treatment Patient Details Name: Gabriella Rogers MRN: [id number] DOB: September 28, 1933 Today's Date: 01/31/2020    History of Present Illness Pt is an 86 y.o. female presenting to hospital 8/6 with R hand numbness, pain behind L eye, difficulty walking, and slurred speech; onset 2 days prior (pt refused EMS transport 1st day).  MRI of brain showing B acute pontine infarcts (L slightly greater than R).  PMH includes brain disorder, cervical dystonia, dizziness, h/o TKR, L ankle surgery, per chart also dementia.    PT Comments    Pt is alert throughout but does repeat some statements several times during session. Son was present and states this is slightly different from baseline. Pt was long sitting in bed upon arriving but was able to exit R side of bed and ambulate 120 ft. She has poor gait quality with poor posture throughout, Pt has difficulty with RLE placement. Slight ataxia noted. High fall risk with all standing activity. Recommend DC to SNF to address balance, gait, and safe functional mobility deficits. She was seated in recliner post session with call bell in reach, chair alarm set, and call bell in reach. Son present at bedside post session.     Follow Up Recommendations  SNF     Equipment Recommendations  Rolling walker with 5" wheels;3in1 (PT)    Recommendations for Other Services       Precautions / Restrictions Precautions Precautions: Fall Restrictions Weight Bearing Restrictions: No    Mobility  Bed Mobility Overal bed mobility: Needs Assistance Bed Mobility: Supine to Sit     Supine to sit: Min assist;Mod assist     General bed mobility comments: pt required slightly more assistance to exit R side of bed this date  Transfers Overall transfer level: Needs assistance Equipment used: Rolling walker (2 wheeled) Transfers: Sit to/from Stand Sit to Stand: Min assist         General transfer comment: min assist to STS from EOB with vcs for improved  technique and sequencing  Ambulation/Gait Ambulation/Gait assistance: Min guard;Min assist Gait Distance (Feet): 120 Feet Assistive device: Rolling walker (2 wheeled) Gait Pattern/deviations: Trunk flexed;Step-to pattern;Narrow base of support;Staggering right Gait velocity: decreased   General Gait Details: pt was able to ambulate 120 ft but has poor posture, slow cadnece and poor step quality. slight ataxia with RLE advancement/placement   Stairs             Wheelchair Mobility    Modified Rankin (Stroke Patients Only)       Balance Overall balance assessment: Needs assistance Sitting-balance support: No upper extremity supported;Feet supported Sitting balance-Leahy Scale: Good Sitting balance - Comments: no LOB seated EOB   Standing balance support: Bilateral upper extremity supported;During functional activity Standing balance-Leahy Scale: Fair Standing balance comment: fair static balance poor dynamic balance even with UE support                            Cognition Arousal/Alertness: Awake/alert Behavior During Therapy: WFL for tasks assessed/performed Overall Cognitive Status: Within Functional Limits for tasks assessed                                 General Comments: pt oriented, but repeats some topics several times during tx, some decreased problem solving/decision making.      Exercises      General Comments  Pertinent Vitals/Pain Pain Assessment: No/denies pain    Home Living                      Prior Function            PT Goals (current goals can now be found in the care plan section) Acute Rehab PT Goals Patient Stated Goal: to return to PLOF Progress towards PT goals: Progressing toward goals    Frequency    7X/week      PT Plan Current plan remains appropriate    Co-evaluation              AM-PAC PT "6 Clicks" Mobility   Outcome Measure  Help needed turning from your  back to your side while in a flat bed without using bedrails?: None Help needed moving from lying on your back to sitting on the side of a flat bed without using bedrails?: A Little Help needed moving to and from a bed to a chair (including a wheelchair)?: A Little Help needed standing up from a chair using your arms (e.g., wheelchair or bedside chair)?: A Little Help needed to walk in hospital room?: A Little Help needed climbing 3-5 steps with a railing? : A Lot 6 Click Score: 18    End of Session Equipment Utilized During Treatment: Gait belt Activity Tolerance: Patient tolerated treatment well Patient left: in chair;with call bell/phone within reach;with chair alarm set Nurse Communication: Mobility status;Precautions;Other (comment) PT Visit Diagnosis: Unsteadiness on feet (R26.81);Other abnormalities of gait and mobility (R26.89);Muscle weakness (generalized) (M62.81);History of falling (Z91.81);Difficulty in walking, not elsewhere classified (R26.2)     Time: 1340-1404 PT Time Calculation (min) (ACUTE ONLY): 24 min  Charges:  $Gait Training: 8-22 mins $Therapeutic Activity: 8-22 mins                     Jetta Lout PTA 01/31/20, 3:20 PM

## 2020-01-31 NOTE — Plan of Care (Signed)
  Problem: Education: Goal: Knowledge of disease or condition will improve Outcome: Progressing Goal: Knowledge of secondary prevention will improve Outcome: Progressing Goal: Knowledge of patient specific risk factors addressed and post discharge goals established will improve Outcome: Progressing Goal: Individualized Educational Video(s) Outcome: Progressing   Problem: Coping: Goal: Will verbalize positive feelings about self Outcome: Progressing   Problem: Self-Care: Goal: Ability to participate in self-care as condition permits will improve Outcome: Progressing   

## 2020-02-01 LAB — SARS CORONAVIRUS 2 BY RT PCR (HOSPITAL ORDER, PERFORMED IN ~~LOC~~ HOSPITAL LAB): SARS Coronavirus 2: NEGATIVE

## 2020-02-01 MED ORDER — ASPIRIN 325 MG PO TABS: 325.0000 mg | ORAL_TABLET | Freq: Every day | ORAL | 0 refills | Status: DC

## 2020-02-01 MED ORDER — CLOPIDOGREL BISULFATE 75 MG PO TABS: 75.0000 mg | ORAL_TABLET | Freq: Every day | ORAL | 0 refills | Status: AC

## 2020-02-01 MED ORDER — STROKE: EARLY STAGES OF RECOVERY BOOK: 1.0000 | Freq: Once | 0 refills | Status: AC

## 2020-02-01 MED ORDER — ATORVASTATIN CALCIUM 80 MG PO TABS: 80.0000 mg | ORAL_TABLET | Freq: Every day | ORAL | 0 refills | Status: DC

## 2020-02-01 NOTE — Plan of Care (Signed)
  Problem: Education: Goal: Knowledge of disease or condition will improve Outcome: Progressing Goal: Knowledge of secondary prevention will improve Outcome: Progressing Goal: Knowledge of patient specific risk factors addressed and post discharge goals established will improve Outcome: Progressing Goal: Individualized Educational Video(s) Outcome: Progressing   

## 2020-02-01 NOTE — Progress Notes (Signed)
Gabriella Rogers to be D/C'd Skilled nursing facility ([facility])  per MD order.  Discussed prescriptions and follow up appointments with the patient and daughter Gabriella Rogers. Report called to [facility] Nurse at [facility].  Allergies as of 02/01/2020      Reactions   Actonel  [risedronate Sodium]    Piper    Pneumococcal Vaccine Other (See Comments)   WEAKNESS, DISORIENTED, "ARM SORE, RED, HURTING"   Risedronate Other (See Comments)   REDNESS IN FACE   Alendronate Rash   Aleve [naproxen Sodium] Rash   Pravastatin Rash      Medication List    TAKE these medications    stroke: mapping our early stages of recovery book Misc 1 each by Does not apply route once for 1 dose.   acetaminophen 325 MG tablet Commonly known as: TYLENOL Take 650 mg by mouth every 6 (six) hours as needed.   aspirin 325 MG tablet Take 1 tablet (325 mg total) by mouth daily. Start taking on: February 02, 2020   atorvastatin 80 MG tablet Commonly known as: LIPITOR Take 1 tablet (80 mg total) by mouth daily at 6 PM.   clonazePAM 0.5 MG tablet Commonly known as: KLONOPIN Take 0.5 mg by mouth 2 (two) times daily as needed.   clopidogrel 75 MG tablet Commonly known as: PLAVIX Take 1 tablet (75 mg total) by mouth daily. Start taking on: February 02, 2020   donepezil 10 MG tablet Commonly known as: ARICEPT Take by mouth.   ONE-A-DAY WOMENS PO Take 1 tablet by mouth daily.       Vitals:   02/01/20 0719 02/01/20 1104  BP: (!) 158/84 (!) 131/54  Pulse: 73 69  Resp: 16 16  Temp: 97.6 F (36.4 C) 98 F (36.7 C)  SpO2: 96% 98%    Tele box removed.Skin clean, dry and intact without evidence of skin break down, no evidence of skin tears noted.   Patient escorted via stretcher, and D/C via EMS.  Rigoberto Noel

## 2020-02-01 NOTE — Progress Notes (Signed)
Pt to d/c to SNF, report called to Chief Technology Officer at [facility]. Awaiting transportation.

## 2020-02-01 NOTE — Progress Notes (Signed)
PT Cancellation Note  Patient Details Name: Gabriella Rogers MRN: [id number] DOB: 30-May-1934   Cancelled Treatment:    Reason Eval/Treat Not Completed:  (Treatment session attempted.  Patient politely declines participation with session at this time; awaiting breakfast and preparing for meds with RN.  Will re-attempt at later time/date as medically appropriate and available.)   Tallin Hart H. Manson Passey, PT, DPT, NCS 02/01/20, 10:55 AM [phone]

## 2020-02-01 NOTE — Care Management Important Message (Signed)
Important Message  Patient Details  Name: COLETTA LOCKNER MRN: [id number] Date of Birth: Jun 01, 1934   Medicare Important Message Given:  Yes     Johnell Comings 02/01/2020, 11:27 AM

## 2020-02-01 NOTE — TOC Transition Note (Signed)
Transition of Care [facility]) - CM/SW Discharge Note   Patient Details  Name: [name] MRN: [id number] Date of Birth: 25-Mar-1934  Transition of Care [facility]) CM/SW Contact:  Shawn Route, RN Phone Number: 02/01/2020, 3:32 PM       Final next level of care: Skilled Nursing Facility Barriers to Discharge: No Barriers Identified   Patient Goals and CMS Choice Patient states their goals for this hospitalization and ongoing recovery are:: "I would like to get up to do all the things I used to do without using a walker or cain"   Choice offered to / list presented to : Patient, Adult Children  Discharge Placement              Patient chooses bed at: [facility] [place] Patient to be transferred to [facility] by: [facility] Name of family member notified: [name], daughter Patient and family notified of of transfer: 02/01/20  Discharge Plan and Services In-house Referral: Clinical Social Work                                   Social Determinants of Health (SDOH) Interventions     Readmission Risk Interventions No flowsheet data found.

## 2020-02-01 NOTE — Discharge Summary (Signed)
Physician Discharge Summary  Gabriella Rogers WJX:[id number] DOB: 1933/09/20 DOA: 01/25/2020  PCP: Corky Downs, MD  Admit date: 01/25/2020 Discharge date: 02/01/2020  Recommendations for Outpatient Follow-up:  1. Discharge to SNF 2. Follow up with PCP in 7-10 days. 3. Have CBC checked on that visit with PCP. 4. Follow up with neurology as directed. 5. Take aspirin and plavix together for 90 days, and then aspirin alone.   Contact information for after-discharge care    Destination    [facility] Preferred SNF .   Service: Skilled Nursing Contact information: [address] [place] [address] [phone]                 Discharge Diagnoses: Principal diagnosis is #1 1. Acute b/l pontine CVA 2. Dysphagia 3. Orthostasis 4. Hypotension 5. Hyperlipidemia 6. Chronic cognitive dysfunction/Dementia  Discharge Condition: Fair  Disposition: SNF  Diet recommendation: Heart healthy  Filed Weights   01/29/20 0445 01/30/20 0455 01/31/20 0356  Weight: 53 kg 53.2 kg 53.9 kg    History of present illness:  Gabriella Rogers is a 86 y.o. female with medical history significant for mild cognitive impairment, and cervical dystonia, followed by neurology, who presented to the emergency room with 2-day history of right hand numbness and difficulty walking.  EMS went to the home the day prior but patient refused to come to the emergency room.  The symptoms seem to abate but then recurred on the day of arrival and she this time agreed to be brought to the emergency room.  On arrival her main complaint was numbness in the right hand . ED Course: Vitals on arrival were unremarkable.  Blood work also unremarkable.  Head CT showed no acute findings however MRI brain showed bilateral acute pontine infarcts left slightly greater than right as well as chronic ischemic microangiopathy.  Patient was given aspirin, hospitalist consulted for admission.  Hospital Course:   The patient is a 86 yr old woman who presented on 01/25/2020 with complaints of dysphagia and word finding difficulty. She was found to have high-grade mid-basilar artery stenosis, hi-grade proximal A2 right anterior cerebral artery stenosis and moderate stenosis within the distal M1 right MCA. The plan is for her to discharge on DAPT (Plavix and ASA) for 3 months to be followed by ASA monotherapy. She is to continue lipitor.  She will require repeat CT head and CTA head if she has a change in mental status.  The patient will be discharged to SNF today for PT/OT and further SLP treatment.  Today's assessment: S: The patient is resting comfortably. No new complaints. O: Vitals:  Vitals:   02/01/20 0719 02/01/20 1104  BP: (!) 158/84 (!) 131/54  Pulse: 73 69  Resp: 16 16  Temp: 97.6 F (36.4 C) 98 F (36.7 C)  SpO2: 96% 98%   Constitutional:   The patient is awake, alert, and oriented x 3. No acute distress. Respiratory:   No increased work of breathing.  No wheezes, rales, or rhonchi  No tactile fremitus Cardiovascular:   Regular rate and rhythm  No murmurs, ectopy, or gallups.  No lateral PMI. No thrills. Abdomen:   Abdomen is soft, non-tender, non-distended  No hernias, masses, or organomegaly  Normoactive bowel sounds.  Musculoskeletal:   No cyanosis, clubbing, or edema Skin:   No rashes, lesions, ulcers  palpation of skin: no induration or nodules Neurologic:   CN 2-12 intact  Sensation all 4 extremities intact Psychiatric:   Mental status ?  Mood, affect appropriate ? Orientation to person, place, time   judgment and insight appear intact  Discharge Instructions  Discharge Instructions    Activity as tolerated - No restrictions   Complete by: As directed    Call MD for:   Complete by: As directed    Neurological changes.   Call MD for:  difficulty breathing, headache or visual disturbances   Complete by: As directed    Call MD for:   extreme fatigue   Complete by: As directed    Diet - low sodium heart healthy   Complete by: As directed    Discharge instructions   Complete by: As directed    Discharge to SNF Follow up with PCP in 7-10 days. Have CBC checked on that visit with PCP. Follow up with neurology as directed. Take aspirin and plavix together for 90 days, and then aspirin alone.   Increase activity slowly   Complete by: As directed      Allergies as of 02/01/2020      Reactions   Actonel  [risedronate Sodium]    Piper    Pneumococcal Vaccine Other (See Comments)   WEAKNESS, DISORIENTED, "ARM SORE, RED, HURTING"   Risedronate Other (See Comments)   REDNESS IN FACE   Alendronate Rash   Aleve [naproxen Sodium] Rash   Pravastatin Rash      Medication List    TAKE these medications    stroke: mapping our early stages of recovery book Misc 1 each by Does not apply route once for 1 dose.   acetaminophen 325 MG tablet Commonly known as: TYLENOL Take 650 mg by mouth every 6 (six) hours as needed.   aspirin 325 MG tablet Take 1 tablet (325 mg total) by mouth daily. Start taking on: February 02, 2020   atorvastatin 80 MG tablet Commonly known as: LIPITOR Take 1 tablet (80 mg total) by mouth daily at 6 PM.   clonazePAM 0.5 MG tablet Commonly known as: KLONOPIN Take 0.5 mg by mouth 2 (two) times daily as needed.   clopidogrel 75 MG tablet Commonly known as: PLAVIX Take 1 tablet (75 mg total) by mouth daily. Start taking on: February 02, 2020   donepezil 10 MG tablet Commonly known as: ARICEPT Take by mouth.   ONE-A-DAY WOMENS PO Take 1 tablet by mouth daily.      Allergies  Allergen Reactions  . Actonel  [Risedronate Sodium]   . Piper   . Pneumococcal Vaccine Other (See Comments)    WEAKNESS, DISORIENTED, "ARM SORE, RED, HURTING"  . Risedronate Other (See Comments)    REDNESS IN FACE  . Alendronate Rash  . Aleve [Naproxen Sodium] Rash  . Pravastatin Rash    The results of  significant diagnostics from this hospitalization (including imaging, microbiology, ancillary and laboratory) are listed below for reference.    Significant Diagnostic Studies: CT ANGIO HEAD W OR WO CONTRAST  Result Date: 01/26/2020 CLINICAL DATA:  Stroke/TIA, assess extracranial arteries; posterior circulation infarcts, rule out basilar thrombus. EXAM: CT ANGIOGRAPHY HEAD AND NECK TECHNIQUE: Multidetector CT imaging of the head and neck was performed using the standard protocol during bolus administration of intravenous contrast. Multiplanar CT image reconstructions and MIPs were obtained to evaluate the vascular anatomy. Carotid stenosis measurements (when applicable) are obtained utilizing NASCET criteria, using the distal internal carotid diameter as the denominator. CONTRAST:  33mL OMNIPAQUE IOHEXOL 350 MG/ML SOLN COMPARISON:  MRI head 01/26/2020, head CT 01/25/2020. FINDINGS: CT HEAD FINDINGS Brain: Moderate generalized parenchymal atrophy.  No acute infarcts within the pons bilaterally were better appreciated on the MRI performed earlier the same day. Moderate patchy hypoattenuation within the cerebral white matter is nonspecific, but consistent with chronic small vessel ischemic disease. There is no acute intracranial hemorrhage. No demarcated cortical infarct is identified. No extra-axial fluid collection. No evidence of intracranial mass. No midline shift. Vascular: Atherosclerotic calcifications. Skull: Normal. Negative for fracture or focal lesion. Sinuses: No significant paranasal sinus disease or mastoid effusion. Orbits: No acute abnormality. Review of the MIP images confirms the above findings CTA NECK FINDINGS Aortic arch: Standard aortic branching. Atherosclerotic plaque within the visualized aortic arch and proximal major branch vessels of the neck. Atherosclerotic stenosis of the proximal right subclavian artery likely exceed 50%. Right carotid system: CCA and ICA patent within the neck  without significant stenosis (50% or greater). Mild atherosclerotic plaque within the carotid bifurcation Left carotid system: CCA and ICA patent within the neck without significant stenosis (50% or greater). Mild atherosclerotic plaque within the carotid bifurcation. Vertebral arteries: The right vertebral artery is developmentally diminutive but patent throughout the neck. The dominant left vertebral artery is patent throughout the neck without significant stenosis. Skeleton: No acute bony abnormality or aggressive osseous lesion. Cervical spondylosis Other neck: No neck mass or cervical lymphadenopathy. Upper chest: Scarring within the right lung apex. Review of the MIP images confirms the above findings CTA HEAD FINDINGS Anterior circulation: The intracranial internal carotid arteries are patent. Mild nonstenotic plaque within both vessels. The M1 middle cerebral arteries are patent. Moderate stenosis of the distal M1 right middle cerebral artery (series 15, image 19). No M2 proximal branch occlusion is identified. The anterior cerebral arteries are patent. Atherosclerotic irregularity of the anterior cerebral arteries bilaterally. Most notably, there is a high-grade stenosis within the proximal A2 right ACA. No intracranial aneurysm is identified. Posterior circulation: The non dominant intracranial right vertebral artery is developmentally diminutive but patent, and terminates as the right PICA. The dominant intracranial left vertebral artery is patent without significant stenosis. There is a high-grade focal stenosis within the mid basilar artery. There are fetal origin posterior cerebral arteries bilaterally. The posterior cerebral arteries are patent bilaterally without high-grade proximal stenosis. Venous sinuses: Within limitations of contrast timing, no convincing thrombus. Anatomic variants: As described Review of the MIP images confirms the above findings High-grade focal stenosis within the mid  basilar artery. These findings were called by telephone at the time of interpretation on 01/26/2020 at 11:55 am to provider [facility] AdvocateALMAN KHALIQDINA , who verbally acknowledged these results. These results will be called to the ordering clinician or representative by the Radiologist Assistant, and communication documented in the PACS or [employer] Dashboard. IMPRESSION: CT head: 1. Acute infarcts within the pons bilaterally were better appreciated on the same-day brain MRI. 2. No interval infarct is identified. 3. Moderate generalized parenchymal atrophy and chronic small vessel ischemic disease. CTA neck: The common carotid, internal carotid and vertebral arteries are patent within the neck without hemodynamically significant stenosis. CTA head: 1. High-grade focal stenosis within the mid basilar artery. 2. Moderate stenosis within the distal M1 right middle cerebral artery. 3. High-grade focal stenosis within the proximal A2 right anterior cerebral artery. Electronically Signed   By: Jackey LogeKyle  Golden DO   On: 01/26/2020 12:11   CT HEAD WO CONTRAST  Result Date: 01/25/2020 CLINICAL DATA:  Stroke suspected.  Dizziness. EXAM: CT HEAD WITHOUT CONTRAST TECHNIQUE: Contiguous axial images were obtained from the base of the skull through the vertex without intravenous contrast.  COMPARISON:  Head CT 07/21/2016 FINDINGS: Brain: No acute intracranial hemorrhage. No focal mass lesion. No CT evidence of acute infarction. No midline shift or mass effect. No hydrocephalus. Basilar cisterns are patent. There are periventricular and subcortical white matter hypodensities. Generalized cortical atrophy. Vascular: No hyperdense vessel or unexpected calcification. Skull: Normal. Negative for fracture or focal lesion. Sinuses/Orbits: Paranasal sinuses and mastoid air cells are clear. Orbits are clear. Other: None. IMPRESSION: 1. No acute intracranial findings. 2. Atrophy and white matter microvascular disease. Electronically Signed   By: Genevive Bi M.D.   On: 01/25/2020 12:46   CT ANGIO NECK W OR WO CONTRAST  Result Date: 01/26/2020 CLINICAL DATA:  Stroke/TIA, assess extracranial arteries; posterior circulation infarcts, rule out basilar thrombus. EXAM: CT ANGIOGRAPHY HEAD AND NECK TECHNIQUE: Multidetector CT imaging of the head and neck was performed using the standard protocol during bolus administration of intravenous contrast. Multiplanar CT image reconstructions and MIPs were obtained to evaluate the vascular anatomy. Carotid stenosis measurements (when applicable) are obtained utilizing NASCET criteria, using the distal internal carotid diameter as the denominator. CONTRAST:  75mL OMNIPAQUE IOHEXOL 350 MG/ML SOLN COMPARISON:  MRI head 01/26/2020, head CT 01/25/2020. FINDINGS: CT HEAD FINDINGS Brain: Moderate generalized parenchymal atrophy. No acute infarcts within the pons bilaterally were better appreciated on the MRI performed earlier the same day. Moderate patchy hypoattenuation within the cerebral white matter is nonspecific, but consistent with chronic small vessel ischemic disease. There is no acute intracranial hemorrhage. No demarcated cortical infarct is identified. No extra-axial fluid collection. No evidence of intracranial mass. No midline shift. Vascular: Atherosclerotic calcifications. Skull: Normal. Negative for fracture or focal lesion. Sinuses: No significant paranasal sinus disease or mastoid effusion. Orbits: No acute abnormality. Review of the MIP images confirms the above findings CTA NECK FINDINGS Aortic arch: Standard aortic branching. Atherosclerotic plaque within the visualized aortic arch and proximal major branch vessels of the neck. Atherosclerotic stenosis of the proximal right subclavian artery likely exceed 50%. Right carotid system: CCA and ICA patent within the neck without significant stenosis (50% or greater). Mild atherosclerotic plaque within the carotid bifurcation Left carotid system: CCA and ICA patent  within the neck without significant stenosis (50% or greater). Mild atherosclerotic plaque within the carotid bifurcation. Vertebral arteries: The right vertebral artery is developmentally diminutive but patent throughout the neck. The dominant left vertebral artery is patent throughout the neck without significant stenosis. Skeleton: No acute bony abnormality or aggressive osseous lesion. Cervical spondylosis Other neck: No neck mass or cervical lymphadenopathy. Upper chest: Scarring within the right lung apex. Review of the MIP images confirms the above findings CTA HEAD FINDINGS Anterior circulation: The intracranial internal carotid arteries are patent. Mild nonstenotic plaque within both vessels. The M1 middle cerebral arteries are patent. Moderate stenosis of the distal M1 right middle cerebral artery (series 15, image 19). No M2 proximal branch occlusion is identified. The anterior cerebral arteries are patent. Atherosclerotic irregularity of the anterior cerebral arteries bilaterally. Most notably, there is a high-grade stenosis within the proximal A2 right ACA. No intracranial aneurysm is identified. Posterior circulation: The non dominant intracranial right vertebral artery is developmentally diminutive but patent, and terminates as the right PICA. The dominant intracranial left vertebral artery is patent without significant stenosis. There is a high-grade focal stenosis within the mid basilar artery. There are fetal origin posterior cerebral arteries bilaterally. The posterior cerebral arteries are patent bilaterally without high-grade proximal stenosis. Venous sinuses: Within limitations of contrast timing, no convincing thrombus. Anatomic variants:  As described Review of the MIP images confirms the above findings High-grade focal stenosis within the mid basilar artery. These findings were called by telephone at the time of interpretation on 01/26/2020 at 11:55 am to provider [facility] , who  verbally acknowledged these results. These results will be called to the ordering clinician or representative by the Radiologist Assistant, and communication documented in the PACS or [employer]. IMPRESSION: CT head: 1. Acute infarcts within the pons bilaterally were better appreciated on the same-day brain MRI. 2. No interval infarct is identified. 3. Moderate generalized parenchymal atrophy and chronic small vessel ischemic disease. CTA neck: The common carotid, internal carotid and vertebral arteries are patent within the neck without hemodynamically significant stenosis. CTA head: 1. High-grade focal stenosis within the mid basilar artery. 2. Moderate stenosis within the distal M1 right middle cerebral artery. 3. High-grade focal stenosis within the proximal A2 right anterior cerebral artery. Electronically Signed   By: Jackey Loge DO   On: 01/26/2020 12:11   MR BRAIN WO CONTRAST  Result Date: 01/26/2020 CLINICAL DATA:  Right arm numbness and slurred speech EXAM: MRI HEAD WITHOUT CONTRAST TECHNIQUE: Multiplanar, multiecho pulse sequences of the brain and surrounding structures were obtained without intravenous contrast. COMPARISON:  07/22/2016 brain MRI FINDINGS: Brain: Bilateral pontine infarcts, left slightly greater than right. Multifocal white matter hyperintensity, most commonly due to chronic ischemic microangiopathy. Normal volume of CSF spaces. No chronic microhemorrhage. Normal midline structures. Vascular: Normal flow voids. Skull and upper cervical spine: Normal marrow signal. Sinuses/Orbits: Negative. Other: None. IMPRESSION: 1. Bilateral acute pontine infarcts, left slightly greater than right. 2. Findings of chronic ischemic microangiopathy. Electronically Signed   By: Deatra Robinson M.D.   On: 01/26/2020 00:34   US Carotid Bilateral (at [facility] and [facility] only)  Result Date: 01/26/2020 CLINICAL DATA:  Slurred speech, difficulty walking and right hand weakness. History of hyperlipidemia. EXAM:  BILATERAL CAROTID DUPLEX ULTRASOUND TECHNIQUE: Wallace Cullens scale imaging, color Doppler and duplex ultrasound were performed of bilateral carotid and vertebral arteries in the neck. COMPARISON:  None. FINDINGS: Criteria: Quantification of carotid stenosis is based on velocity parameters that correlate the residual internal carotid diameter with NASCET-based stenosis levels, using the diameter of the distal internal carotid lumen as the denominator for stenosis measurement. The following velocity measurements were obtained: RIGHT ICA:  99/33 cm/sec CCA:  72/15 cm/sec SYSTOLIC ICA/CCA RATIO:  1.4 ECA:  135 cm/sec LEFT ICA:  68/24 cm/sec CCA:  89/27 cm/sec SYSTOLIC ICA/CCA RATIO:  0.8 ECA:  66 cm/sec RIGHT CAROTID ARTERY: Mild amount of predominately calcified plaque is present at the level of the carotid bulb and proximal right ICA. Estimated right ICA stenosis is less than 50%. RIGHT VERTEBRAL ARTERY: Antegrade flow with normal waveform and velocity. LEFT CAROTID ARTERY: Mild amount of calcified plaque at the level of the carotid bulb and proximal left ICA. Estimated left ICA stenosis is less than 50%. LEFT VERTEBRAL ARTERY: Antegrade flow with normal waveform and velocity. IMPRESSION: Mild amount of plaque at the level of both carotid bulbs and proximal internal carotid arteries. Estimated bilateral ICA stenoses are less than 50%. Electronically Signed   By: Irish Lack M.D.   On: 01/26/2020 09:12   ECHOCARDIOGRAM COMPLETE  Result Date: 01/27/2020    ECHOCARDIOGRAM REPORT   Patient Name:   WANETA FITTING Date of Exam: 01/26/2020 Medical Rec #:  [id number]       Height:       63.0 in Accession #:    [id number]  Weight:       122.0 lb Date of Birth:  Apr 28, 1934        BSA:          1.567 m Patient Age:    86 years        BP:           144/87 mmHg Patient Gender: F               HR:           78 bpm. Exam Location:  [facility] Procedure: 2D Echo, Cardiac Doppler and Color Doppler Indications:     Stroke 434.91 / I163.9   History:         Patient has no prior history of Echocardiogram examinations.                  HLD, DIZZY.  Sonographer:     Neysa Bonito Roar Referring Phys:  [id number] Andris Baumann Diagnosing Phys: Alwyn Pea MD IMPRESSIONS  1. Left ventricular ejection fraction, by estimation, is 55 to 60%. The left ventricle has normal function. The left ventricle has no regional wall motion abnormalities. Left ventricular diastolic parameters were normal.  2. Right ventricular systolic function is normal. The right ventricular size is normal. There is normal pulmonary artery systolic pressure.  3. The mitral valve is normal in structure. No evidence of mitral valve regurgitation.  4. The aortic valve is normal in structure. Aortic valve regurgitation is not visualized. FINDINGS  Left Ventricle: Left ventricular ejection fraction, by estimation, is 55 to 60%. The left ventricle has normal function. The left ventricle has no regional wall motion abnormalities. The left ventricular internal cavity size was normal in size. There is  no left ventricular hypertrophy. Left ventricular diastolic parameters were normal. Right Ventricle: The right ventricular size is normal. No increase in right ventricular wall thickness. Right ventricular systolic function is normal. There is normal pulmonary artery systolic pressure. The tricuspid regurgitant velocity is 2.29 m/s, and  with an assumed right atrial pressure of 10 mmHg, the estimated right ventricular systolic pressure is 31.0 mmHg. Left Atrium: Left atrial size was normal in size. Right Atrium: Right atrial size was normal in size. Pericardium: There is no evidence of pericardial effusion. Mitral Valve: The mitral valve is normal in structure. No evidence of mitral valve regurgitation. Tricuspid Valve: The tricuspid valve is normal in structure. Tricuspid valve regurgitation is trivial. Aortic Valve: The aortic valve is normal in structure. Aortic valve regurgitation is not  visualized. Aortic valve peak gradient measures 6.4 mmHg. Pulmonic Valve: The pulmonic valve was normal in structure. Pulmonic valve regurgitation is not visualized. Aorta: The aortic root is normal in size and structure. IAS/Shunts: No atrial level shunt detected by color flow Doppler.  LEFT VENTRICLE PLAX 2D LVIDd:         3.56 cm  Diastology LVIDs:         2.47 cm  LV e' lateral:   5.44 cm/s LV PW:         0.96 cm  LV E/e' lateral: 10.1 LV IVS:        0.99 cm  LV e' medial:    6.20 cm/s LVOT diam:     1.70 cm  LV E/e' medial:  8.8 LVOT Area:     2.27 cm  RIGHT VENTRICLE RV Mid diam:    2.23 cm RV S prime:     13.50 cm/s TAPSE (M-mode): 2.1 cm LEFT ATRIUM  Index       RIGHT ATRIUM          Index LA diam:        3.35 cm 2.14 cm/m  RA Area:     9.77 cm LA Vol (A2C):   28.8 ml 18.38 ml/m RA Volume:   17.20 ml 10.97 ml/m LA Vol (A4C):   22.8 ml 14.55 ml/m LA Biplane Vol: 26.1 ml 16.65 ml/m  AORTIC VALVE                PULMONIC VALVE AV Area (Vmax): 1.84 cm    PV Vmax:        0.94 m/s AV Vmax:        126.00 cm/s PV Peak grad:   3.5 mmHg AV Peak Grad:   6.4 mmHg    RVOT Peak grad: 3 mmHg LVOT Vmax:      102.00 cm/s  AORTA Ao Root diam: 2.50 cm MITRAL VALVE               TRICUSPID VALVE MV Area (PHT): 3.93 cm    TR Peak grad:   21.0 mmHg MV Decel Time: 193 msec    TR Vmax:        229.00 cm/s MV E velocity: 54.80 cm/s MV A velocity: 72.80 cm/s  SHUNTS MV E/A ratio:  0.75        Systemic Diam: 1.70 cm MV A Prime:    10.9 cm/s Dwayne D Callwood MD Electronically signed by Alwyn Pea MD Signature Date/Time: 01/27/2020/3:42:22 PM    Final     Microbiology: Recent Results (from the past 240 hour(s))  SARS Coronavirus 2 by RT PCR (hospital order, performed in [facility] hospital lab) Nasopharyngeal Nasopharyngeal Swab     Status: None   Collection Time: 01/26/20  3:50 PM   Specimen: Nasopharyngeal Swab  Result Value Ref Range Status   SARS Coronavirus 2 NEGATIVE NEGATIVE Final    Comment:  (NOTE) SARS-CoV-2 target nucleic acids are NOT DETECTED.  The SARS-CoV-2 RNA is generally detectable in upper and lower respiratory specimens during the acute phase of infection. The lowest concentration of SARS-CoV-2 viral copies this assay can detect is 250 copies / mL. A negative result does not preclude SARS-CoV-2 infection and should not be used as the sole basis for treatment or other patient management decisions.  A negative result may occur with improper specimen collection / handling, submission of specimen other than nasopharyngeal swab, presence of viral mutation(s) within the areas targeted by this assay, and inadequate number of viral copies (<250 copies / mL). A negative result must be combined with clinical observations, patient history, and epidemiological information.  Fact Sheet for Patients:   BoilerBrush.com.cy  Fact Sheet for Healthcare Providers: https://pope.com/  This test is not yet approved or  cleared by the [place] FDA and has been authorized for detection and/or diagnosis of SARS-CoV-2 by FDA under an Emergency Use Authorization (EUA).  This EUA will remain in effect (meaning this test can be used) for the duration of the COVID-19 declaration under Section 564(b)(1) of the Act, 21 U.S.C. section 360bbb-3(b)(1), unless the authorization is terminated or revoked sooner.  Performed at [facility] [address] [place], [place] [address]   SARS Coronavirus 2 by RT PCR (hospital order, performed in [facility] hospital lab) Nasopharyngeal Nasopharyngeal Swab     Status: None   Collection Time: 02/01/20 11:57 AM   Specimen: Nasopharyngeal Swab  Result Value Ref Range Status  SARS Coronavirus 2 NEGATIVE NEGATIVE Final    Comment: (NOTE) SARS-CoV-2 target nucleic acids are NOT DETECTED.  The SARS-CoV-2 RNA is generally detectable in upper and lower respiratory specimens during the acute  phase of infection. The lowest concentration of SARS-CoV-2 viral copies this assay can detect is 250 copies / mL. A negative result does not preclude SARS-CoV-2 infection and should not be used as the sole basis for treatment or other patient management decisions.  A negative result may occur with improper specimen collection / handling, submission of specimen other than nasopharyngeal swab, presence of viral mutation(s) within the areas targeted by this assay, and inadequate number of viral copies (<250 copies / mL). A negative result must be combined with clinical observations, patient history, and epidemiological information.  Fact Sheet for Patients:   BoilerBrush.com.cy  Fact Sheet for Healthcare Providers: https://pope.com/  This test is not yet approved or  cleared by the [place] FDA and has been authorized for detection and/or diagnosis of SARS-CoV-2 by FDA under an Emergency Use Authorization (EUA).  This EUA will remain in effect (meaning this test can be used) for the duration of the COVID-19 declaration under Section 564(b)(1) of the Act, 21 U.S.C. section 360bbb-3(b)(1), unless the authorization is terminated or revoked sooner.  Performed at [facility], [address] [place], [place] [address]      Labs: Basic Metabolic Panel: Recent Labs  Lab 01/31/20 0629  NA 139  K 3.9  CL 108  CO2 22  GLUCOSE 82  BUN 22  CREATININE 0.83  CALCIUM 9.2   Liver Function Tests: No results for input(s): AST, ALT, ALKPHOS, BILITOT, PROT, ALBUMIN in the last 168 hours. No results for input(s): LIPASE, AMYLASE in the last 168 hours. No results for input(s): AMMONIA in the last 168 hours. CBC: Recent Labs  Lab 01/31/20 0629  WBC 6.9  HGB 13.3  HCT 38.5  MCV 101.3*  PLT 328   Cardiac Enzymes: No results for input(s): CKTOTAL, CKMB, CKMBINDEX, TROPONINI in the last 168 hours. BNP: BNP (last 3  results) No results for input(s): BNP in the last 8760 hours.  ProBNP (last 3 results) No results for input(s): PROBNP in the last 8760 hours.  CBG: No results for input(s): GLUCAP in the last 168 hours.  Principal Problem:   Acute CVA (cerebrovascular accident) [facility]) Active Problems:   Cervical dystonia   Mild cognitive impairment   Time coordinating discharge: 38 minutes.  Signed:        Coulson Wehner, DO [facility]  02/01/2020, 1:30 PM

## 2020-02-26 ENCOUNTER — Encounter: Payer: Self-pay | Admitting: Internal Medicine

## 2020-02-26 ENCOUNTER — Other Ambulatory Visit: Payer: Self-pay

## 2020-02-26 ENCOUNTER — Ambulatory Visit (INDEPENDENT_AMBULATORY_CARE_PROVIDER_SITE_OTHER): Payer: Medicare HMO | Admitting: Internal Medicine

## 2020-02-26 VITALS — BP 154/79 | HR 73 | Ht 63.0 in | Wt 118.9 lb

## 2020-02-26 DIAGNOSIS — H6120 Impacted cerumen, unspecified ear: Secondary | ICD-10-CM

## 2020-02-26 DIAGNOSIS — E782 Mixed hyperlipidemia: Secondary | ICD-10-CM | POA: Diagnosis not present

## 2020-02-26 DIAGNOSIS — M810 Age-related osteoporosis without current pathological fracture: Secondary | ICD-10-CM | POA: Diagnosis not present

## 2020-02-26 DIAGNOSIS — R42 Dizziness and giddiness: Secondary | ICD-10-CM

## 2020-02-26 DIAGNOSIS — G3184 Mild cognitive impairment, so stated: Secondary | ICD-10-CM

## 2020-02-26 DIAGNOSIS — G243 Spasmodic torticollis: Secondary | ICD-10-CM

## 2020-02-26 NOTE — Assessment & Plan Note (Signed)
Pt advised to continue taking vitamin D. She also is taking Aspirin 325 mg PO daily.

## 2020-02-26 NOTE — Assessment & Plan Note (Signed)
Pt has no signs of behavior issues today.

## 2020-02-26 NOTE — Assessment & Plan Note (Signed)
Stable at the present time. She also has kyphosis of the back. There is no evidence of heart failure.

## 2020-02-26 NOTE — Patient Instructions (Signed)
Understanding How COVID-19 Vaccines Work Updated Nov 15, 2019  What You Need to Know . COVID-19 vaccines are safe and effective. . You may have side effects after vaccination, but these are normal. . It typically takes two weeks after you are fully vaccinated for the body to build protection (immunity) against the virus that causes COVID-19. . If you are not vaccinated, find a vaccine. Keep taking all precautions until you are fully vaccinated. . If you are fully vaccinated, you can resume activities that you did prior to the pandemic. Learn more about what you can do when you have been fully vaccinated.  Vaccine sticker and vial The Immune System--the Body's Defense Against Infection To understand how COVID-19 vaccines work, it helps to first look at how our bodies fight illness. When germs, such as the virus that causes COVID-19, invade our bodies, they attack and multiply. This invasion, called an infection, is what causes illness. Our immune system uses several tools to fight infection. Blood contains red cells, which carry oxygen to tissues and organs, and white or immune cells, which fight infection. Different types of white blood cells fight infection in different ways:  Marland Kitchen Macrophages are white blood cells that swallow up and digest germs and dead or dying cells. The macrophages leave behind parts of the invading germs, called "antigens". The body identifies antigens as dangerous and stimulates antibodies to attack them. . B-lymphocytes are defensive white blood cells. They produce antibodies that attack the pieces of the virus left behind by the macrophages. . T-lymphocytes are another type of defensive white blood cell. They attack cells in the body that have already been infected. . The first time a person is infected with the virus that causes COVID-19, it can take several days or weeks for their body to make and use all the germ-fighting tools needed to get over the infection. After the  infection, the person's immune system remembers what it learned about how to protect the body against that disease.  The body keeps a few T-lymphocytes, called "memory cells," that go into action quickly if the body encounters the same virus again. When the familiar antigens are detected, B-lymphocytes produce antibodies to attack them. Experts are still learning how long these memory cells protect a person against the virus that causes COVID-19.  How COVID-19 Vaccines Work COVID-19 vaccines help our bodies develop immunity to the virus that causes COVID-19 without [place] having to get the illness.  COVID vaccine Different types of vaccines work in different ways to offer protection. But with all types of vaccines, the body is left with a supply of "memory" T-lymphocytes as well as B-lymphocytes that will remember how to fight that virus in the future.  It typically takes a few weeks after vaccination for the body to produce T-lymphocytes and B-lymphocytes. Therefore, it is possible that a person could be infected with the virus that causes COVID-19 just before or just after vaccination and then get sick because the vaccine did not have enough time to provide protection.  Sometimes after vaccination, the process of building immunity can cause symptoms, such as fever. These symptoms are normal and are signs that the body is building immunity.  Learn more about getting your vaccine.  Types of Vaccines Currently, there are three main types of COVID-19 vaccines that are authorized and recommended or undergoing large-scale (Phase 3) clinical trials in the [place].  Below is a description of how each type of vaccine prompts our bodies to recognize and  protect [place] from the virus that causes COVID-19. None of these vaccines can give you COVID-19.  Marland Kitchen mRNA vaccines contain material from the virus that causes COVID-19 that gives our cells instructions for how to make a harmless protein that is unique to  the virus. After our cells make copies of the protein, they destroy the genetic material from the vaccine. Our bodies recognize that the protein should not be there and build T-lymphocytes and B-lymphocytes that will remember how to fight the virus that causes COVID-19 if we are infected in the future. . Protein subunit vaccines include harmless pieces (proteins) of the virus that causes COVID-19 instead of the entire germ. Once vaccinated, our bodies recognize that the protein should not be there and build T-lymphocytes and antibodies that will remember how to fight the virus that causes COVID-19 if we are infected in the future. . Vector vaccines contain a modified version of a different virus than the one that causes COVID-19. Inside the shell of the modified virus, there is material from the virus that causes COVID-19. This is called a "viral vector." Once the viral vector is inside our cells, the genetic material gives cells instructions to make a protein that is unique to the virus that causes COVID-19. Using these instructions, our cells make copies of the protein. This prompts our bodies to build T-lymphocytes and B-lymphocytes that will remember how to fight that virus if we are infected in the future.  Some COVID-19 Vaccines Require More Than One Shot To be fully vaccinated, you will need two shots of some COVID-19 vaccines.  . Two shots: If you get a COVID-19 vaccine that requires two shots, you are considered fully vaccinated two weeks after your second shot. Pfizer-BioNTech and Moderna COVID-19 vaccines require two shots. . One Shot: If you get a COVID-19 vaccine that requires one shot, you are considered fully vaccinated two weeks after your shot. Johnson & Johnson's Linwood Dibbles COVID-19 vaccine only requires one shot. . If it has been less than two weeks since your shot, or if you still need to get your second shot, you are NOT fully protected. Keep taking steps to protect yourself and others  until you are fully vaccinated (two weeks after your final shot).  Do I still need to get the COVID-19 vaccine if I was diagnosed with and recovered from COVID-19? Yes, you should be vaccinated regardless of whether you already had COVID-19 because: . Research has not yet shown how long you are protected from getting COVID-19 again after you recover from COVID-19. . Vaccination helps protect you even if you've already had COVID-19. . Evidence is emerging that people get better protection by being fully vaccinated compared with having had COVID-19. One study showed that unvaccinated people who already had COVID-19 are more than 2 times as likely than fully vaccinated people to get COVID-19 again.  If you were treated for COVID-19 with monoclonal antibodies or convalescent plasma, you should wait 90 days before getting a COVID-19 vaccine. Talk to your doctor if you are unsure what treatments you received or if you have more questions about getting a COVID-19 vaccine.   Information from: CompDrinks.no https://www.cdc.gov/coronavirus/2019-ncov/vaccines/faq.html#:~:text=No.,the%20criteria%20before%20getting%20vaccinated

## 2020-02-26 NOTE — Progress Notes (Signed)
Established Patient Office Visit  SUBJECTIVE:  Subjective  Patient ID: Gabriella Rogers, female    DOB: 03-Apr-1934  Age: 86 y.o. MRN: [id number]  CC:  Chief Complaint  Patient presents with  . Follow-up    patient is here for [facility]     HPI Gabriella Rogers is a 86 y.o. female presenting today for a follow up to her recent stroke.   She was at [facility] from 01/25/2020 to 02/01/2020. She presented with right hand numbness and abnormal gait for two days; her speech was clear. EKG revealed normal EKG. CT head revealed no acute intracranial findings. MRI head revealed acute bilateral pontine CVA. She was given aspirin. Neurology was consulted; medical management was decided and she was prescribed Plavix 300mg  loading and 75 mg daily x3 months. She was also prescribed Aspirin 325 mg to be taken indefinitely.   She does not want the COVID or Flu vaccine. She notes that she does not like needles.    Past Medical History:  Diagnosis Date  . Brain disorder 03/27/2014  . Cervical dystonia 01/24/2012  . Clinical depression 11/26/2015  . Dizziness 11/26/2015   Overview:  Chronic, felt secondary to inner ear per ENT evaluation approximately 1996.   . H/O total knee replacement 11/23/2015  . Headache, migraine 11/26/2015  . HLD (hyperlipidemia) 11/26/2015  . Hypercholesteremia   . Osteoporosis, post-menopausal 11/26/2015    Past Surgical History:  Procedure Laterality Date  . ABDOMINAL HYSTERECTOMY    . ANKLE SURGERY Left   . KNEE SURGERY Right     Family History  Problem Relation Age of Onset  . Stroke Maternal Grandfather   . Stroke Paternal Grandfather   . Kidney cancer Neg Hx   . Hematuria Neg Hx     Social History   Socioeconomic History  . Marital status: Married    Spouse name: Not on file  . Number of children: Not on file  . Years of education: Not on file  . Highest education level: Not on file  Occupational History  . Not on file  Tobacco  Use  . Smoking status: Never Smoker  . Smokeless tobacco: Never Used  Substance and Sexual Activity  . Alcohol use: No  . Drug use: Not on file  . Sexual activity: Not on file  Other Topics Concern  . Not on file  Social History Narrative  . Not on file   Social Determinants of Health   Financial Resource Strain:   . Difficulty of Paying Living Expenses: Not on file  Food Insecurity:   . Worried About 01/26/2016 in the Last Year: Not on file  . Ran Out of Food in the Last Year: Not on file  Transportation Needs:   . Lack of Transportation (Medical): Not on file  . Lack of Transportation (Non-Medical): Not on file  Physical Activity:   . Days of Exercise per Week: Not on file  . Minutes of Exercise per Session: Not on file  Stress:   . Feeling of Stress : Not on file  Social Connections:   . Frequency of Communication with Friends and Family: Not on file  . Frequency of Social Gatherings with Friends and Family: Not on file  . Attends Religious Services: Not on file  . Active Member of Clubs or Organizations: Not on file  . Attends Programme researcher, broadcasting/film/video Meetings: Not on file  . Marital Status: Not on file  Intimate  Partner Violence:   . Fear of Current or Ex-Partner: Not on file  . Emotionally Abused: Not on file  . Physically Abused: Not on file  . Sexually Abused: Not on file     Current Outpatient Medications:  .  acetaminophen (TYLENOL) 325 MG tablet, Take 650 mg by mouth every 6 (six) hours as needed., Disp: , Rfl:  .  aspirin 325 MG tablet, Take 1 tablet (325 mg total) by mouth daily., Disp: 30 tablet, Rfl: 0 .  atorvastatin (LIPITOR) 80 MG tablet, Take 1 tablet (80 mg total) by mouth daily at 6 PM., Disp: 30 tablet, Rfl: 0 .  clonazePAM (KLONOPIN) 0.5 MG tablet, Take 0.5 mg by mouth 2 (two) times daily as needed. , Disp: , Rfl:  .  clopidogrel (PLAVIX) 75 MG tablet, Take 1 tablet (75 mg total) by mouth daily., Disp: 90 tablet, Rfl: 0 .  donepezil  (ARICEPT) 10 MG tablet, Take by mouth., Disp: , Rfl:  .  Multiple Vitamins-Minerals (ONE-A-DAY WOMENS PO), Take 1 tablet by mouth daily., Disp: , Rfl:    Allergies  Allergen Reactions  . Actonel  [Risedronate Sodium]   . [place]   . Pneumococcal Vaccine Other (See Comments)    WEAKNESS, DISORIENTED, "ARM SORE, RED, HURTING"  . Risedronate Other (See Comments)    REDNESS IN FACE  . Alendronate Rash  . Aleve [Naproxen Sodium] Rash  . Pravastatin Rash    ROS Review of Systems  Constitutional: Negative.   HENT: Positive for congestion (ears).   Eyes: Negative.   Respiratory: Negative.   Cardiovascular: Negative.   Gastrointestinal: Negative.   Endocrine: Negative.   Genitourinary: Negative.   Musculoskeletal: Negative.   Skin: Negative.   Allergic/Immunologic: Negative.   Neurological: Positive for weakness (right s/p stroke).  Hematological: Negative.   Psychiatric/Behavioral: Negative.   All other systems reviewed and are negative.    OBJECTIVE:    Physical Exam Vitals reviewed.  Constitutional:      Appearance: Normal appearance.     Comments: Pt ambulates with a walker  HENT:     Right Ear: There is impacted cerumen.     Mouth/Throat:     Mouth: Mucous membranes are moist.  Eyes:     Pupils: Pupils are equal, round, and reactive to light.  Neck:     Vascular: No carotid bruit.  Cardiovascular:     Rate and Rhythm: Normal rate and regular rhythm.     Pulses: Normal pulses.     Heart sounds: Normal heart sounds.  Pulmonary:     Effort: Pulmonary effort is normal.     Breath sounds: Normal breath sounds.  Abdominal:     General: Bowel sounds are normal.     Palpations: Abdomen is soft. There is no hepatomegaly, splenomegaly or mass.     Tenderness: There is no abdominal tenderness.     Hernia: No hernia is present.  Musculoskeletal:        General: No tenderness.     Cervical back: Neck supple.     Right lower leg: No edema.     Left lower leg: No edema.       Comments: Kyphosis of the back.  Skin:    Findings: No rash.  Neurological:     Mental Status: She is alert and oriented to person, place, and time.     Motor: No weakness.  Psychiatric:        Mood and Affect: Mood and affect normal.  Behavior: Behavior normal.     BP (!) 154/79   Pulse 73   Ht 5\' 3"  (1.6 m)   Wt 118 lb 14.4 oz (53.9 kg)   BMI 21.06 kg/m  Wt Readings from Last 3 Encounters:  02/26/20 118 lb 14.4 oz (53.9 kg)  01/31/20 118 lb 12.8 oz (53.9 kg)  12/06/19 122 lb 3.2 oz (55.4 kg)    Health Maintenance Due  Topic Date Due  . COVID-19 Vaccine (1) Never done  . TETANUS/TDAP  Never done  . PNA vac Low Risk Adult (2 of 2 - PCV13) 08/12/2011  . INFLUENZA VACCINE  Never done    There are no preventive care reminders to display for this patient.  CBC Latest Ref Rng & Units 01/31/2020 01/25/2020 04/01/2018  WBC 4.0 - 10.5 K/uL 6.9 5.5 5.5  Hemoglobin 12.0 - 15.0 g/dL 16.113.3 09.612.4 04.512.6  Hematocrit 36 - 46 % 38.5 36.1 37.3  Platelets 150 - 400 K/uL 328 316 328   CMP Latest Ref Rng & Units 01/31/2020 01/25/2020 04/01/2018  Glucose 70 - 99 mg/dL 82 97 99  BUN 8 - 23 mg/dL 22 15 16   Creatinine 0.44 - 1.00 mg/dL 4.090.83 8.110.70 9.140.85  Sodium 135 - 145 mmol/L 139 138 139  Potassium 3.5 - 5.1 mmol/L 3.9 4.3 4.0  Chloride 98 - 111 mmol/L 108 105 106  CO2 22 - 32 mmol/L 22 24 26   Calcium 8.9 - 10.3 mg/dL 9.2 9.1 9.0  Total Protein 6.5 - 8.1 g/dL - 7.9 -  Total Bilirubin 0.3 - 1.2 mg/dL - 0.9 -  Alkaline Phos 38 - 126 U/L - 72 -  AST 15 - 41 U/L - 21 -  ALT 0 - 44 U/L - 14 -    No results found for: TSH Lab Results  Component Value Date   ALBUMIN 4.2 01/25/2020   ANIONGAP 9 01/31/2020   Lab Results  Component Value Date   CHOL 274 (H) 01/26/2020   HDL 66 01/26/2020   LDLCALC 195 (H) 01/26/2020   CHOLHDL 4.2 01/26/2020   Lab Results  Component Value Date   TRIG 65 01/26/2020   Lab Results  Component Value Date   HGBA1C 5.6 01/26/2020      ASSESSMENT  & PLAN:   Problem List Items Addressed This Visit      Nervous and Auditory   Cervical dystonia    Stable at the present time. She also has kyphosis of the back. There is no evidence of heart failure.         Musculoskeletal and Integument   Osteoporosis, post-menopausal    Pt advised to continue taking vitamin D. She also is taking Aspirin 325 mg PO daily.         Other   Dizziness    Dizziness is labile; right now it is under control.       Hyperlipidemia, unspecified    - The patient's hyperlipidemia os stable on lipitor. - The patient will continue the current treatment regimen.  - I encouraged the patient to eat more vegetables and whole wheat, and to avoid fatty foods like whole milk, hard cheese, egg yolks, margarine, baked sweets, and fried foods.  - I encouraged the patient to live an active lifestyle and complete activities for 40 minutes at least three times per week.  - I instructed the patient to go to the ER if they begin having chest pain.       Mild cognitive impairment - Primary  Pt has no signs of behavior issues today.       Wax in ear    Will wash the wax out next week.         No orders of the defined types were placed in this encounter.   Follow-up: No follow-ups on file.    Dr. Woodroe Chen [facility] [address] [place], [place] [address]   By signing my name below, I, [employer], attest that this documentation has been prepared under the direction and in the presence of Corky Downs, MD. Electronically Signed: Corky Downs, MD 02/26/20, 3:27 PM   I personally performed the services described in this documentation, which was SCRIBED in my presence. The recorded information has been reviewed and considered accurate. It has been edited as necessary during review. Corky Downs, MD

## 2020-02-26 NOTE — Assessment & Plan Note (Signed)
Will wash the wax out next week.

## 2020-02-26 NOTE — Assessment & Plan Note (Signed)
-   The patient's hyperlipidemia os stable on lipitor. - The patient will continue the current treatment regimen.  - I encouraged the patient to eat more vegetables and whole wheat, and to avoid fatty foods like whole milk, hard cheese, egg yolks, margarine, baked sweets, and fried foods.  - I encouraged the patient to live an active lifestyle and complete activities for 40 minutes at least three times per week.  - I instructed the patient to go to the ER if they begin having chest pain.

## 2020-02-26 NOTE — Assessment & Plan Note (Signed)
Dizziness is labile; right now it is under control.

## 2020-02-28 ENCOUNTER — Other Ambulatory Visit: Payer: Self-pay | Admitting: *Deleted

## 2020-02-28 MED ORDER — ATORVASTATIN CALCIUM 80 MG PO TABS: 80.0000 mg | ORAL_TABLET | Freq: Every day | ORAL | 0 refills | Status: DC

## 2020-03-06 ENCOUNTER — Ambulatory Visit (INDEPENDENT_AMBULATORY_CARE_PROVIDER_SITE_OTHER): Payer: Medicare HMO | Admitting: Internal Medicine

## 2020-03-06 ENCOUNTER — Other Ambulatory Visit: Payer: Self-pay

## 2020-03-06 ENCOUNTER — Encounter: Payer: Self-pay | Admitting: Internal Medicine

## 2020-03-06 VITALS — BP 122/64 | HR 71 | Ht 63.0 in | Wt 119.4 lb

## 2020-03-06 DIAGNOSIS — R42 Dizziness and giddiness: Secondary | ICD-10-CM | POA: Diagnosis not present

## 2020-03-06 DIAGNOSIS — M810 Age-related osteoporosis without current pathological fracture: Secondary | ICD-10-CM

## 2020-03-06 DIAGNOSIS — H6120 Impacted cerumen, unspecified ear: Secondary | ICD-10-CM | POA: Diagnosis not present

## 2020-03-06 NOTE — Assessment & Plan Note (Signed)
Pt was advised to take multivitamin and Vitamin D.

## 2020-03-06 NOTE — Assessment & Plan Note (Signed)
Bilateral ears wash was completed. Ear examination after wash was normal. Pt denies any dizziness and felt good relief after earwax was gone.

## 2020-03-06 NOTE — Assessment & Plan Note (Signed)
Will evaluate dizziness after removal of earwax. She did not give hx of syncope. Her neck is deviated to the left side due to spasm of the neck muscles. She also has a problem with anxiety.

## 2020-03-06 NOTE — Progress Notes (Signed)
Established Patient Office Visit  Subjective:  Patient ID: Gabriella Rogers, female    DOB: 19-Feb-1934  Age: 86 y.o. MRN: [id number]  CC:  Chief Complaint  Patient presents with  . ear lavage    right ear     She came for a bilateral earwax removal from both ears. She denies any other symptoms.   Murriel Hopper presents for earwax removal.   Past Medical History:  Diagnosis Date  . Brain disorder 03/27/2014  . Cervical dystonia 01/24/2012  . Clinical depression 11/26/2015  . Dizziness 11/26/2015   Overview:  Chronic, felt secondary to inner ear per ENT evaluation approximately 1996.   . H/O total knee replacement 11/23/2015  . Headache, migraine 11/26/2015  . HLD (hyperlipidemia) 11/26/2015  . Hypercholesteremia   . Osteoporosis, post-menopausal 11/26/2015    Past Surgical History:  Procedure Laterality Date  . ABDOMINAL HYSTERECTOMY    . ANKLE SURGERY Left   . KNEE SURGERY Right     Family History  Problem Relation Age of Onset  . Stroke Maternal Grandfather   . Stroke Paternal Grandfather   . Kidney cancer Neg Hx   . Hematuria Neg Hx     Social History   Socioeconomic History  . Marital status: Married    Spouse name: Not on file  . Number of children: Not on file  . Years of education: Not on file  . Highest education level: Not on file  Occupational History  . Not on file  Tobacco Use  . Smoking status: Never Smoker  . Smokeless tobacco: Never Used  Substance and Sexual Activity  . Alcohol use: No  . Drug use: Not on file  . Sexual activity: Not on file  Other Topics Concern  . Not on file  Social History Narrative  . Not on file   Social Determinants of Health   Financial Resource Strain:   . Difficulty of Paying Living Expenses: Not on file  Food Insecurity:   . Worried About Programme researcher, broadcasting/film/video in the Last Year: Not on file  . Ran Out of Food in the Last Year: Not on file  Transportation Needs:   . Lack of Transportation (Medical): Not on file  .  Lack of Transportation (Non-Medical): Not on file  Physical Activity:   . Days of Exercise per Week: Not on file  . Minutes of Exercise per Session: Not on file  Stress:   . Feeling of Stress : Not on file  Social Connections:   . Frequency of Communication with Friends and Family: Not on file  . Frequency of Social Gatherings with Friends and Family: Not on file  . Attends Religious Services: Not on file  . Active Member of Clubs or Organizations: Not on file  . Attends Banker Meetings: Not on file  . Marital Status: Not on file  Intimate Partner Violence:   . Fear of Current or Ex-Partner: Not on file  . Emotionally Abused: Not on file  . Physically Abused: Not on file  . Sexually Abused: Not on file     Current Outpatient Medications:  .  acetaminophen (TYLENOL) 325 MG tablet, Take 650 mg by mouth every 6 (six) hours as needed., Disp: , Rfl:  .  aspirin 325 MG tablet, Take 1 tablet (325 mg total) by mouth daily., Disp: 30 tablet, Rfl: 0 .  atorvastatin (LIPITOR) 80 MG tablet, Take 1 tablet (80 mg total) by mouth daily at 6 PM., Disp: 30  tablet, Rfl: 0 .  clonazePAM (KLONOPIN) 0.5 MG tablet, Take 0.5 mg by mouth 2 (two) times daily as needed. , Disp: , Rfl:  .  clopidogrel (PLAVIX) 75 MG tablet, Take 1 tablet (75 mg total) by mouth daily., Disp: 90 tablet, Rfl: 0 .  donepezil (ARICEPT) 10 MG tablet, Take by mouth., Disp: , Rfl:  .  Multiple Vitamins-Minerals (ONE-A-DAY WOMENS PO), Take 1 tablet by mouth daily., Disp: , Rfl:    Allergies  Allergen Reactions  . Actonel  [Risedronate Sodium]   . Piper   . Pneumococcal Vaccine Other (See Comments)    WEAKNESS, DISORIENTED, "ARM SORE, RED, HURTING"  . Risedronate Other (See Comments)    REDNESS IN FACE  . Alendronate Rash  . Aleve [Naproxen Sodium] Rash  . Pravastatin Rash    ROS Review of Systems  Constitutional: Negative for appetite change.  HENT: Positive for ear pain. Negative for congestion and sinus  pain.   Eyes: Negative for pain.  Respiratory: Negative for chest tightness.   Cardiovascular: Negative for chest pain.      Objective:    Physical Exam Vitals reviewed.  Constitutional:      Appearance: Normal appearance.  HENT:     Right Ear: There is impacted cerumen.     Left Ear: There is impacted cerumen.     Ears:      Mouth/Throat:     Mouth: Mucous membranes are moist.  Eyes:     Pupils: Pupils are equal, round, and reactive to light.  Neck:     Vascular: No carotid bruit.  Cardiovascular:     Rate and Rhythm: Normal rate and regular rhythm.     Pulses: Normal pulses.     Heart sounds: Normal heart sounds.  Pulmonary:     Effort: Pulmonary effort is normal.     Breath sounds: Normal breath sounds.  Abdominal:     General: Bowel sounds are normal.     Palpations: Abdomen is soft. There is no hepatomegaly, splenomegaly or mass.     Tenderness: There is no abdominal tenderness.     Hernia: No hernia is present.  Musculoskeletal:        General: No tenderness.     Cervical back: Neck supple.     Right lower leg: No edema.     Left lower leg: No edema.  Skin:    Findings: No rash.  Neurological:     Mental Status: She is alert and oriented to person, place, and time.     Motor: No weakness.  Psychiatric:        Mood and Affect: Mood and affect normal.        Behavior: Behavior normal.     BP 122/64   Pulse 71   Ht 5\' 3"  (1.6 m)   Wt 119 lb 6.4 oz (54.2 kg)   BMI 21.15 kg/m  Wt Readings from Last 3 Encounters:  03/06/20 119 lb 6.4 oz (54.2 kg)  02/26/20 118 lb 14.4 oz (53.9 kg)  01/31/20 118 lb 12.8 oz (53.9 kg)     Health Maintenance Due  Topic Date Due  . COVID-19 Vaccine (1) Never done  . TETANUS/TDAP  Never done  . PNA vac Low Risk Adult (2 of 2 - PCV13) 08/12/2011  . INFLUENZA VACCINE  Never done    There are no preventive care reminders to display for this patient.  No results found for: TSH Lab Results  Component Value Date   WBC  6.9 01/31/2020   HGB 13.3 01/31/2020   HCT 38.5 01/31/2020   MCV 101.3 (H) 01/31/2020   PLT 328 01/31/2020   Lab Results  Component Value Date   NA 139 01/31/2020   K 3.9 01/31/2020   CO2 22 01/31/2020   GLUCOSE 82 01/31/2020   BUN 22 01/31/2020   CREATININE 0.83 01/31/2020   BILITOT 0.9 01/25/2020   ALKPHOS 72 01/25/2020   AST 21 01/25/2020   ALT 14 01/25/2020   PROT 7.9 01/25/2020   ALBUMIN 4.2 01/25/2020   CALCIUM 9.2 01/31/2020   ANIONGAP 9 01/31/2020   Lab Results  Component Value Date   CHOL 274 (H) 01/26/2020   Lab Results  Component Value Date   HDL 66 01/26/2020   Lab Results  Component Value Date   LDLCALC 195 (H) 01/26/2020   Lab Results  Component Value Date   TRIG 65 01/26/2020   Lab Results  Component Value Date   CHOLHDL 4.2 01/26/2020   Lab Results  Component Value Date   HGBA1C 5.6 01/26/2020      Assessment & Plan:   Problem List Items Addressed This Visit      Musculoskeletal and Integument   Osteoporosis, post-menopausal    Pt was advised to take multivitamin and Vitamin D.         Other   Dizziness - Primary    Will evaluate dizziness after removal of earwax. She did not give hx of syncope. Her neck is deviated to the left side due to spasm of the neck muscles. She also has a problem with anxiety.       Relevant Orders   EAR CERUMEN REMOVAL   Wax in ear    Bilateral ears wash was completed. Ear examination after wash was normal. Pt denies any dizziness and felt good relief after earwax was gone.          No orders of the defined types were placed in this encounter.   Follow-up: No follow-ups on file.    Corky Downs, MD

## 2020-04-09 ENCOUNTER — Other Ambulatory Visit: Payer: Self-pay | Admitting: *Deleted

## 2020-04-09 MED ORDER — ATORVASTATIN CALCIUM 80 MG PO TABS: 80.0000 mg | ORAL_TABLET | Freq: Every day | ORAL | 3 refills | Status: DC

## 2020-05-13 ENCOUNTER — Encounter: Payer: Medicare HMO | Admitting: Internal Medicine

## 2020-05-23 ENCOUNTER — Other Ambulatory Visit: Payer: Self-pay

## 2020-05-23 MED ORDER — CLOPIDOGREL BISULFATE 75 MG PO TABS
75.0000 mg | ORAL_TABLET | Freq: Every day | ORAL | 3 refills | Status: DC
Start: 2020-05-23 — End: 2021-09-29

## 2020-07-14 ENCOUNTER — Other Ambulatory Visit: Payer: Self-pay | Admitting: *Deleted

## 2020-07-14 MED ORDER — DONEPEZIL HCL 10 MG PO TABS: 10.0000 mg | ORAL_TABLET | Freq: Every day | ORAL | 6 refills | Status: DC

## 2020-08-25 ENCOUNTER — Other Ambulatory Visit: Payer: Self-pay

## 2020-08-25 ENCOUNTER — Encounter: Payer: Self-pay | Admitting: Internal Medicine

## 2020-08-25 ENCOUNTER — Ambulatory Visit (INDEPENDENT_AMBULATORY_CARE_PROVIDER_SITE_OTHER): Payer: Medicare HMO | Admitting: Internal Medicine

## 2020-08-25 VITALS — BP 134/76 | HR 62 | Ht 63.0 in | Wt 117.8 lb

## 2020-08-25 DIAGNOSIS — G243 Spasmodic torticollis: Secondary | ICD-10-CM | POA: Diagnosis not present

## 2020-08-25 DIAGNOSIS — M810 Age-related osteoporosis without current pathological fracture: Secondary | ICD-10-CM

## 2020-08-25 DIAGNOSIS — E782 Mixed hyperlipidemia: Secondary | ICD-10-CM

## 2020-08-25 DIAGNOSIS — G3184 Mild cognitive impairment, so stated: Secondary | ICD-10-CM | POA: Diagnosis not present

## 2020-08-25 MED ORDER — ATORVASTATIN CALCIUM 80 MG PO TABS: 80.0000 mg | ORAL_TABLET | Freq: Every day | ORAL | 3 refills | Status: AC

## 2020-08-25 MED ORDER — DONEPEZIL HCL 10 MG PO TABS: 10.0000 mg | ORAL_TABLET | Freq: Every day | ORAL | 3 refills | Status: DC

## 2020-08-25 NOTE — Assessment & Plan Note (Signed)
Cognitive impairment is due to age

## 2020-08-25 NOTE — Assessment & Plan Note (Signed)
Patient was advised to take vitamin D 1000 units daily.

## 2020-08-25 NOTE — Assessment & Plan Note (Signed)
-   The patient's hyperlipidemia is stable on med. - The patient will continue the current treatment regimen.  - I encouraged the patient to eat more vegetables and whole wheat, and to avoid fatty foods like whole milk, hard cheese, egg yolks, margarine, baked sweets, and fried foods.  - I encouraged the patient to live an active lifestyle and complete activities for 40 minutes at least three times per week.  - I instructed the patient to go to the ER if they begin having chest pain.  

## 2020-08-25 NOTE — Assessment & Plan Note (Signed)
It is a chronic problem stable at the present time. 

## 2020-08-25 NOTE — Progress Notes (Signed)
Established Patient Office Visit  Subjective:  Patient ID: Gabriella Rogers, female    DOB: [birth year]  Age: 87 y.o. MRN: [id number]  CC:  Chief Complaint  Patient presents with  . Follow-up    Patient is here for her 3 month bp follow up. Patient complains of fatigue for the past 3 weeks.    HPI  Gabriella Rogers presents for general checkup.patient is known to have migraine and a history of stroke in the past has cervical dystonia.  Menopausal osteoporosis.she also has a history of dysthymic disorder and dizziness.  And unspecified hyperlipidemia.  She also has a mild cognitive impairment. Past Medical History:  Diagnosis Date  . Brain disorder 03/27/2014  . Cervical dystonia 01/24/2012  . Clinical depression 11/26/2015  . Dizziness 11/26/2015   Overview:  Chronic, felt secondary to inner ear per ENT evaluation approximately 1996.   . H/O total knee replacement 11/23/2015  . Headache, migraine 11/26/2015  . HLD (hyperlipidemia) 11/26/2015  . Hypercholesteremia   . Osteoporosis, post-menopausal 11/26/2015    Past Surgical History:  Procedure Laterality Date  . ABDOMINAL HYSTERECTOMY    . ANKLE SURGERY Left   . KNEE SURGERY Right     Family History  Problem Relation Age of Onset  . Stroke Maternal Grandfather   . Stroke Paternal Grandfather   . Kidney cancer Neg Hx   . Hematuria Neg Hx     Social History   Socioeconomic History  . Marital status: Married    Spouse name: Not on file  . Number of children: Not on file  . Years of education: Not on file  . Highest education level: Not on file  Occupational History  . Not on file  Tobacco Use  . Smoking status: Never Smoker  . Smokeless tobacco: Never Used  Substance and Sexual Activity  . Alcohol use: No  . Drug use: Not on file  . Sexual activity: Not on file  Other Topics Concern  . Not on file  Social History Narrative  . Not on file   Social Determinants of Health   Financial Resource Strain: Not on file  Food  Insecurity: Not on file  Transportation Needs: Not on file  Physical Activity: Not on file  Stress: Not on file  Social Connections: Not on file  Intimate Partner Violence: Not on file     Current Outpatient Medications:  .  acetaminophen (TYLENOL) 325 MG tablet, Take 650 mg by mouth every 6 (six) hours as needed., Disp: , Rfl:  .  aspirin 325 MG tablet, Take 1 tablet (325 mg total) by mouth daily., Disp: 30 tablet, Rfl: 0 .  clonazePAM (KLONOPIN) 0.5 MG tablet, Take 0.5 mg by mouth 2 (two) times daily as needed. , Disp: , Rfl:  .  clopidogrel (PLAVIX) 75 MG tablet, Take 1 tablet (75 mg total) by mouth daily., Disp: 90 tablet, Rfl: 3 .  Multiple Vitamins-Minerals (ONE-A-DAY WOMENS PO), Take 1 tablet by mouth daily., Disp: , Rfl:  .  atorvastatin (LIPITOR) 80 MG tablet, Take 1 tablet (80 mg total) by mouth daily at 6 PM., Disp: 90 tablet, Rfl: 3 .  donepezil (ARICEPT) 10 MG tablet, Take 1 tablet (10 mg total) by mouth at bedtime., Disp: 90 tablet, Rfl: 3   Allergies  Allergen Reactions  . Actonel  [Risedronate Sodium]   . Piper   . Pneumococcal Vaccine Other (See Comments)    WEAKNESS, DISORIENTED, "ARM SORE, RED, HURTING"  . Risedronate Other (See Comments)  REDNESS IN FACE  . Alendronate Rash  . Aleve [Naproxen Sodium] Rash  . Pravastatin Rash    ROS Review of Systems  Constitutional: Negative.   HENT: Negative.   Eyes: Negative.   Respiratory: Negative.   Cardiovascular: Negative.   Gastrointestinal: Negative.   Endocrine: Negative.   Genitourinary: Negative.   Musculoskeletal: Negative.   Skin: Negative.   Allergic/Immunologic: Negative.   Neurological: Negative.   Hematological: Negative.   Psychiatric/Behavioral: Negative.   All other systems reviewed and are negative.     Objective:    Physical Exam Vitals reviewed.  Constitutional:      Appearance: Normal appearance.  HENT:     Mouth/Throat:     Mouth: Mucous membranes are moist.  Eyes:      Pupils: Pupils are equal, round, and reactive to light.  Neck:     Vascular: No carotid bruit.  Cardiovascular:     Rate and Rhythm: Normal rate and regular rhythm.     Pulses: Normal pulses.     Heart sounds: Normal heart sounds.  Pulmonary:     Effort: Pulmonary effort is normal.     Breath sounds: Normal breath sounds.  Abdominal:     General: Bowel sounds are normal.     Palpations: Abdomen is soft. There is no hepatomegaly, splenomegaly or mass.     Tenderness: There is no abdominal tenderness.     Hernia: No hernia is present.  Musculoskeletal:        General: No tenderness.     Cervical back: Neck supple.     Right lower leg: No edema.     Left lower leg: No edema.  Skin:    Findings: No rash.  Neurological:     Mental Status: She is alert and oriented to person, place, and time.     Sensory: Sensory deficit present.     Motor: No weakness.     Coordination: Finger-Nose-Finger Test abnormal. Impaired rapid alternating movements.     Gait: Gait abnormal.     Comments:  Patient has a cervical dystonia with stiffness of the neck she also weak in the right hand and right leg.  Psychiatric:        Mood and Affect: Mood and affect normal.        Behavior: Behavior normal.     BP 134/76   Pulse 62   Ht 5\' 3"  (1.6 m)   Wt 117 lb 12.8 oz (53.4 kg)   BMI 20.87 kg/m  Wt Readings from Last 3 Encounters:  08/25/20 117 lb 12.8 oz (53.4 kg)  03/06/20 119 lb 6.4 oz (54.2 kg)  02/26/20 118 lb 14.4 oz (53.9 kg)     Health Maintenance Due  Topic Date Due  . COVID-19 Vaccine (1) Never done  . TETANUS/TDAP  Never done  . PNA vac Low Risk Adult (2 of 2 - PCV13) 08/12/2011  . INFLUENZA VACCINE  Never done    There are no preventive care reminders to display for this patient.  No results found for: TSH Lab Results  Component Value Date   WBC 6.9 01/31/2020   HGB 13.3 01/31/2020   HCT 38.5 01/31/2020   MCV 101.3 (H) 01/31/2020   PLT 328 01/31/2020   Lab Results   Component Value Date   NA 139 01/31/2020   K 3.9 01/31/2020   CO2 22 01/31/2020   GLUCOSE 82 01/31/2020   BUN 22 01/31/2020   CREATININE 0.83 01/31/2020   BILITOT 0.9 01/25/2020  ALKPHOS 72 01/25/2020   AST 21 01/25/2020   ALT 14 01/25/2020   PROT 7.9 01/25/2020   ALBUMIN 4.2 01/25/2020   CALCIUM 9.2 01/31/2020   ANIONGAP 9 01/31/2020   Lab Results  Component Value Date   CHOL 274 (H) 01/26/2020   Lab Results  Component Value Date   HDL 66 01/26/2020   Lab Results  Component Value Date   LDLCALC 195 (H) 01/26/2020   Lab Results  Component Value Date   TRIG 65 01/26/2020   Lab Results  Component Value Date   CHOLHDL 4.2 01/26/2020   Lab Results  Component Value Date   HGBA1C 5.6 01/26/2020      Assessment & Plan:   Problem List Items Addressed This Visit      Nervous and Auditory   Cervical dystonia    It is a chronic problem stable at the present time        Musculoskeletal and Integument   Osteoporosis, post-menopausal - Primary    Patient was advised to take vitamin D 1000 units daily.        Other   Hyperlipidemia, unspecified    - The patient's hyperlipidemia is stable on med. - The patient will continue the current treatment regimen.  - I encouraged the patient to eat more vegetables and whole wheat, and to avoid fatty foods like whole milk, hard cheese, egg yolks, margarine, baked sweets, and fried foods.  - I encouraged the patient to live an active lifestyle and complete activities for 40 minutes at least three times per week.  - I instructed the patient to go to the ER if they begin having chest pain.       Relevant Medications   atorvastatin (LIPITOR) 80 MG tablet   Mild cognitive impairment    Cognitive impairment is due to age         Meds ordered this encounter  Medications  . atorvastatin (LIPITOR) 80 MG tablet    Sig: Take 1 tablet (80 mg total) by mouth daily at 6 PM.    Dispense:  90 tablet    Refill:  3  .  donepezil (ARICEPT) 10 MG tablet    Sig: Take 1 tablet (10 mg total) by mouth at bedtime.    Dispense:  90 tablet    Refill:  3    Follow-up: No follow-ups on file.    Corky Downs, MD

## 2020-10-22 ENCOUNTER — Ambulatory Visit: Payer: Medicare HMO | Admitting: Internal Medicine

## 2021-01-07 ENCOUNTER — Encounter: Payer: Self-pay | Admitting: Internal Medicine

## 2021-01-07 ENCOUNTER — Other Ambulatory Visit: Payer: Self-pay

## 2021-01-07 ENCOUNTER — Ambulatory Visit (INDEPENDENT_AMBULATORY_CARE_PROVIDER_SITE_OTHER): Payer: Medicare HMO | Admitting: Internal Medicine

## 2021-01-07 VITALS — BP 130/80 | HR 85 | Ht 63.0 in | Wt 118.0 lb

## 2021-01-07 DIAGNOSIS — G243 Spasmodic torticollis: Secondary | ICD-10-CM | POA: Diagnosis not present

## 2021-01-07 DIAGNOSIS — G3184 Mild cognitive impairment, so stated: Secondary | ICD-10-CM | POA: Diagnosis not present

## 2021-01-07 DIAGNOSIS — E782 Mixed hyperlipidemia: Secondary | ICD-10-CM

## 2021-01-07 DIAGNOSIS — R309 Painful micturition, unspecified: Secondary | ICD-10-CM | POA: Diagnosis not present

## 2021-01-07 DIAGNOSIS — M810 Age-related osteoporosis without current pathological fracture: Secondary | ICD-10-CM | POA: Diagnosis not present

## 2021-01-07 LAB — POCT URINALYSIS DIPSTICK
Bilirubin, UA: NEGATIVE
Blood, UA: NEGATIVE
Glucose, UA: NEGATIVE
Ketones, UA: NEGATIVE
Leukocytes, UA: NEGATIVE
Nitrite, UA: POSITIVE
Protein, UA: NEGATIVE
Spec Grav, UA: >=1.03 — AB (ref 1.010–1.025)
Urobilinogen, UA: 0.2 E.U./dL
pH, UA: 6 (ref 5.0–8.0)

## 2021-01-07 MED ORDER — CIPROFLOXACIN HCL 500 MG PO TABS: 500.0000 mg | ORAL_TABLET | Freq: Two times a day (BID) | ORAL | 0 refills | Status: AC

## 2021-01-07 NOTE — Assessment & Plan Note (Signed)
Patient was advised on low-cholesterol diet

## 2021-01-07 NOTE — Assessment & Plan Note (Signed)
stable °

## 2021-01-07 NOTE — Progress Notes (Signed)
Established Patient Office Visit  Subjective:  Patient ID: Gabriella Rogers, female    DOB: 1933/09/07  Age: 87 y.o. MRN: [id number]  CC:  Chief Complaint  Patient presents with   Back Pain    With urinary frequency    Back Pain   Gabriella Rogers presents for check up, she has dysurea  Past Medical History:  Diagnosis Date   Brain disorder 03/27/2014   Cervical dystonia 01/24/2012   Clinical depression 11/26/2015   Dizziness 11/26/2015   Overview:  Chronic, felt secondary to inner ear per ENT evaluation approximately 1996.    H/O total knee replacement 11/23/2015   Headache, migraine 11/26/2015   HLD (hyperlipidemia) 11/26/2015   Hypercholesteremia    Osteoporosis, post-menopausal 11/26/2015    Past Surgical History:  Procedure Laterality Date   ABDOMINAL HYSTERECTOMY     ANKLE SURGERY Left    KNEE SURGERY Right     Family History  Problem Relation Age of Onset   Stroke Maternal Grandfather    Stroke Paternal Grandfather    Kidney cancer Neg Hx    Hematuria Neg Hx     Social History   Socioeconomic History   Marital status: Married    Spouse name: Not on file   Number of children: Not on file   Years of education: Not on file   Highest education level: Not on file  Occupational History   Not on file  Tobacco Use   Smoking status: Never   Smokeless tobacco: Never  Substance and Sexual Activity   Alcohol use: No   Drug use: Not on file   Sexual activity: Not on file  Other Topics Concern   Not on file  Social History Narrative   Not on file   Social Determinants of Health   Financial Resource Strain: Not on file  Food Insecurity: Not on file  Transportation Needs: Not on file  Physical Activity: Not on file  Stress: Not on file  Social Connections: Not on file  Intimate Partner Violence: Not on file     Current Outpatient Medications:    ciprofloxacin (CIPRO) 500 MG tablet, Take 1 tablet (500 mg total) by mouth 2 (two) times daily for 3 days., Disp: 6  tablet, Rfl: 0   acetaminophen (TYLENOL) 325 MG tablet, Take 650 mg by mouth every 6 (six) hours as needed., Disp: , Rfl:    aspirin 325 MG tablet, Take 1 tablet (325 mg total) by mouth daily., Disp: 30 tablet, Rfl: 0   atorvastatin (LIPITOR) 80 MG tablet, Take 1 tablet (80 mg total) by mouth daily at 6 PM., Disp: 90 tablet, Rfl: 3   clonazePAM (KLONOPIN) 0.5 MG tablet, Take 0.5 mg by mouth 2 (two) times daily as needed. , Disp: , Rfl:    clopidogrel (PLAVIX) 75 MG tablet, Take 1 tablet (75 mg total) by mouth daily., Disp: 90 tablet, Rfl: 3   donepezil (ARICEPT) 10 MG tablet, Take 1 tablet (10 mg total) by mouth at bedtime., Disp: 90 tablet, Rfl: 3   Multiple Vitamins-Minerals (ONE-A-DAY WOMENS PO), Take 1 tablet by mouth daily., Disp: , Rfl:    Allergies  Allergen Reactions   Actonel  [Risedronate Sodium]    Piper    Pneumococcal Vaccine Other (See Comments)    WEAKNESS, DISORIENTED, "ARM SORE, RED, HURTING"   Risedronate Other (See Comments)    REDNESS IN FACE   Alendronate Rash   Aleve [Naproxen Sodium] Rash   Pravastatin Rash    ROS  Review of Systems  Constitutional: Negative.   HENT: Negative.    Eyes: Negative.   Respiratory: Negative.    Cardiovascular: Negative.   Gastrointestinal: Negative.   Endocrine: Negative.   Genitourinary: Negative.   Musculoskeletal:  Positive for back pain.  Skin: Negative.   Allergic/Immunologic: Negative.   Neurological: Negative.   Hematological: Negative.   Psychiatric/Behavioral: Negative.    All other systems reviewed and are negative.    Objective:    Physical Exam Vitals reviewed.  Constitutional:      Appearance: Normal appearance.  HENT:     Head:     Comments: Cervical dystonia patient can walk without a walker he also has arthritis of both knees there is no history of syncope.    Mouth/Throat:     Mouth: Mucous membranes are moist.  Eyes:     Pupils: Pupils are equal, round, and reactive to light.  Neck:      Vascular: No carotid bruit.  Cardiovascular:     Rate and Rhythm: Normal rate and regular rhythm.     Pulses: Normal pulses.     Heart sounds: Normal heart sounds.  Pulmonary:     Effort: Pulmonary effort is normal.     Breath sounds: Normal breath sounds.  Abdominal:     General: Bowel sounds are normal. There is no distension.     Palpations: Abdomen is soft. There is no hepatomegaly, splenomegaly or mass.     Tenderness: There is no abdominal tenderness.     Hernia: No hernia is present.  Musculoskeletal:        General: No swelling or tenderness.     Cervical back: Neck supple.     Right lower leg: No edema.     Left lower leg: No edema.  Skin:    Findings: Bruising present. No rash.  Neurological:     General: No focal deficit present.     Mental Status: She is alert and oriented to person, place, and time. Mental status is at baseline.     Motor: No weakness.  Psychiatric:        Mood and Affect: Mood and affect normal.        Behavior: Behavior normal.        Thought Content: Thought content normal.    BP 130/80   Pulse 85   Ht 5\' 3"  (1.6 m)   Wt 118 lb (53.5 kg)   BMI 20.90 kg/m  Wt Readings from Last 3 Encounters:  01/07/21 118 lb (53.5 kg)  08/25/20 117 lb 12.8 oz (53.4 kg)  03/06/20 119 lb 6.4 oz (54.2 kg)     Health Maintenance Due  Topic Date Due   COVID-19 Vaccine (1) Never done   TETANUS/TDAP  Never done   Zoster Vaccines- Shingrix (1 of 2) Never done   PNA vac Low Risk Adult (2 of 2 - PCV13) 08/12/2011    There are no preventive care reminders to display for this patient.  No results found for: TSH Lab Results  Component Value Date   WBC 6.9 01/31/2020   HGB 13.3 01/31/2020   HCT 38.5 01/31/2020   MCV 101.3 (H) 01/31/2020   PLT 328 01/31/2020   Lab Results  Component Value Date   NA 139 01/31/2020   K 3.9 01/31/2020   CO2 22 01/31/2020   GLUCOSE 82 01/31/2020   BUN 22 01/31/2020   CREATININE 0.83 01/31/2020   BILITOT 0.9 01/25/2020    ALKPHOS 72 01/25/2020   AST  21 01/25/2020   ALT 14 01/25/2020   PROT 7.9 01/25/2020   ALBUMIN 4.2 01/25/2020   CALCIUM 9.2 01/31/2020   ANIONGAP 9 01/31/2020   Lab Results  Component Value Date   CHOL 274 (H) 01/26/2020   Lab Results  Component Value Date   HDL 66 01/26/2020   Lab Results  Component Value Date   LDLCALC 195 (H) 01/26/2020   Lab Results  Component Value Date   TRIG 65 01/26/2020   Lab Results  Component Value Date   CHOLHDL 4.2 01/26/2020   Lab Results  Component Value Date   HGBA1C 5.6 01/26/2020      Assessment & Plan:   Problem List Items Addressed This Visit       Nervous and Auditory   Cervical dystonia    stable         Musculoskeletal and Integument   Osteoporosis, post-menopausal    Stable at the present time.  He cannot tolerate Fosamax.         Other   Hyperlipidemia, unspecified    Patient was advised on low-cholesterol diet       Mild cognitive impairment    Memory is okay, cognition is borderline, she does not have any evidence of dementia.       Painful urination - Primary    Urine is positive for nitrites we will start her on Cipro       Relevant Orders   POCT Urinalysis Dipstick (Completed)    Meds ordered this encounter  Medications   ciprofloxacin (CIPRO) 500 MG tablet    Sig: Take 1 tablet (500 mg total) by mouth 2 (two) times daily for 3 days.    Dispense:  6 tablet    Refill:  0    Follow-up: No follow-ups on file.    Corky Downs, MD

## 2021-01-07 NOTE — Assessment & Plan Note (Signed)
Urine is positive for nitrites we will start her on Cipro

## 2021-01-07 NOTE — Assessment & Plan Note (Signed)
Memory is okay, cognition is borderline, she does not have any evidence of dementia.

## 2021-01-07 NOTE — Assessment & Plan Note (Signed)
Stable at the present time.  He cannot tolerate Fosamax.

## 2021-02-25 ENCOUNTER — Other Ambulatory Visit: Payer: Self-pay

## 2021-02-25 ENCOUNTER — Ambulatory Visit (INDEPENDENT_AMBULATORY_CARE_PROVIDER_SITE_OTHER): Payer: Medicare HMO | Admitting: Internal Medicine

## 2021-02-25 ENCOUNTER — Encounter: Payer: Self-pay | Admitting: Internal Medicine

## 2021-02-25 VITALS — BP 153/81 | HR 64 | Ht 63.0 in | Wt 118.5 lb

## 2021-02-25 DIAGNOSIS — G243 Spasmodic torticollis: Secondary | ICD-10-CM | POA: Diagnosis not present

## 2021-02-25 DIAGNOSIS — G3184 Mild cognitive impairment, so stated: Secondary | ICD-10-CM

## 2021-02-25 DIAGNOSIS — R269 Unspecified abnormalities of gait and mobility: Secondary | ICD-10-CM

## 2021-02-25 DIAGNOSIS — M12561 Traumatic arthropathy, right knee: Secondary | ICD-10-CM | POA: Diagnosis not present

## 2021-02-25 DIAGNOSIS — M810 Age-related osteoporosis without current pathological fracture: Secondary | ICD-10-CM | POA: Diagnosis not present

## 2021-02-25 DIAGNOSIS — G43C0 Periodic headache syndromes in child or adult, not intractable: Secondary | ICD-10-CM | POA: Diagnosis not present

## 2021-02-25 NOTE — Progress Notes (Signed)
Established Patient Office Visit  Subjective:  Patient ID: Gabriella Rogers, female    DOB: 01-10-1934  Age: 87 y.o. MRN: [id number]  CC:  Chief Complaint  Patient presents with   Fatigue    Patient complains of having no energy    HPI  Gabriella Rogers presents for check up  Past Medical History:  Diagnosis Date   Brain disorder 03/27/2014   Cervical dystonia 01/24/2012   Clinical depression 11/26/2015   Dizziness 11/26/2015   Overview:  Chronic, felt secondary to inner ear per ENT evaluation approximately 1996.    H/O total knee replacement 11/23/2015   Headache, migraine 11/26/2015   HLD (hyperlipidemia) 11/26/2015   Hypercholesteremia    Osteoporosis, post-menopausal 11/26/2015    Past Surgical History:  Procedure Laterality Date   ABDOMINAL HYSTERECTOMY     ANKLE SURGERY Left    KNEE SURGERY Right     Family History  Problem Relation Age of Onset   Stroke Maternal Grandfather    Stroke Paternal Grandfather    Kidney cancer Neg Hx    Hematuria Neg Hx     Social History   Socioeconomic History   Marital status: Married    Spouse name: Not on file   Number of children: Not on file   Years of education: Not on file   Highest education level: Not on file  Occupational History   Not on file  Tobacco Use   Smoking status: Never   Smokeless tobacco: Never  Substance and Sexual Activity   Alcohol use: No   Drug use: Not on file   Sexual activity: Not on file  Other Topics Concern   Not on file  Social History Narrative   Not on file   Social Determinants of Health   Financial Resource Strain: Not on file  Food Insecurity: Not on file  Transportation Needs: Not on file  Physical Activity: Not on file  Stress: Not on file  Social Connections: Not on file  Intimate Partner Violence: Not on file     Current Outpatient Medications:    acetaminophen (TYLENOL) 325 MG tablet, Take 650 mg by mouth every 6 (six) hours as needed., Disp: , Rfl:    aspirin 325 MG  tablet, Take 1 tablet (325 mg total) by mouth daily., Disp: 30 tablet, Rfl: 0   atorvastatin (LIPITOR) 80 MG tablet, Take 1 tablet (80 mg total) by mouth daily at 6 PM., Disp: 90 tablet, Rfl: 3   clonazePAM (KLONOPIN) 0.5 MG tablet, Take 0.5 mg by mouth 2 (two) times daily as needed. , Disp: , Rfl:    clopidogrel (PLAVIX) 75 MG tablet, Take 1 tablet (75 mg total) by mouth daily., Disp: 90 tablet, Rfl: 3   donepezil (ARICEPT) 10 MG tablet, Take 1 tablet (10 mg total) by mouth at bedtime., Disp: 90 tablet, Rfl: 3   Multiple Vitamins-Minerals (ONE-A-DAY WOMENS PO), Take 1 tablet by mouth daily., Disp: , Rfl:    Allergies  Allergen Reactions   Actonel  [Risedronate Sodium]    Piper    Pneumococcal Vaccine Other (See Comments)    WEAKNESS, DISORIENTED, "ARM SORE, RED, HURTING"   Risedronate Other (See Comments)    REDNESS IN FACE   Alendronate Rash   Aleve [Naproxen Sodium] Rash   Pravastatin Rash    ROS Review of Systems  Constitutional: Negative.   HENT: Negative.    Eyes: Negative.   Respiratory: Negative.    Cardiovascular: Negative.   Gastrointestinal: Negative.  Endocrine: Negative.   Genitourinary: Negative.   Musculoskeletal: Negative.   Skin: Negative.   Allergic/Immunologic: Negative.   Neurological: Negative.   Hematological: Negative.   Psychiatric/Behavioral: Negative.    All other systems reviewed and are negative.    Objective:    Physical Exam Vitals reviewed.  Constitutional:      Appearance: Normal appearance.  HENT:     Mouth/Throat:     Mouth: Mucous membranes are moist.  Eyes:     Pupils: Pupils are equal, round, and reactive to light.  Neck:     Vascular: No carotid bruit.  Cardiovascular:     Rate and Rhythm: Normal rate and regular rhythm.     Pulses: Normal pulses.     Heart sounds: Normal heart sounds.  Pulmonary:     Effort: Pulmonary effort is normal.     Breath sounds: Normal breath sounds.  Abdominal:     General: Bowel sounds are  normal.     Palpations: Abdomen is soft. There is no hepatomegaly, splenomegaly or mass.     Tenderness: There is no abdominal tenderness.     Hernia: No hernia is present.  Musculoskeletal:        General: No tenderness.     Cervical back: Neck supple.     Right lower leg: No edema.     Left lower leg: No edema.     Comments: Pt has cervical  dystonia  and scoliosis  Skin:    Findings: No rash.  Neurological:     Mental Status: She is alert and oriented to person, place, and time.     Motor: No weakness.  Psychiatric:        Mood and Affect: Mood and affect normal.        Behavior: Behavior normal.    BP (!) 153/81   Pulse 64   Ht 5\' 3"  (1.6 m)   Wt 118 lb 8 oz (53.8 kg)   BMI 20.99 kg/m  Wt Readings from Last 3 Encounters:  02/25/21 118 lb 8 oz (53.8 kg)  01/07/21 118 lb (53.5 kg)  08/25/20 117 lb 12.8 oz (53.4 kg)     Health Maintenance Due  Topic Date Due   COVID-19 Vaccine (1) Never done   TETANUS/TDAP  Never done   Zoster Vaccines- Shingrix (1 of 2) Never done   PNA vac Low Risk Adult (2 of 2 - PCV13) 08/12/2011   INFLUENZA VACCINE  Never done    There are no preventive care reminders to display for this patient.  No results found for: TSH Lab Results  Component Value Date   WBC 6.9 01/31/2020   HGB 13.3 01/31/2020   HCT 38.5 01/31/2020   MCV 101.3 (H) 01/31/2020   PLT 328 01/31/2020   Lab Results  Component Value Date   NA 139 01/31/2020   K 3.9 01/31/2020   CO2 22 01/31/2020   GLUCOSE 82 01/31/2020   BUN 22 01/31/2020   CREATININE 0.83 01/31/2020   BILITOT 0.9 01/25/2020   ALKPHOS 72 01/25/2020   AST 21 01/25/2020   ALT 14 01/25/2020   PROT 7.9 01/25/2020   ALBUMIN 4.2 01/25/2020   CALCIUM 9.2 01/31/2020   ANIONGAP 9 01/31/2020   Lab Results  Component Value Date   CHOL 274 (H) 01/26/2020   Lab Results  Component Value Date   HDL 66 01/26/2020   Lab Results  Component Value Date   LDLCALC 195 (H) 01/26/2020   Lab Results   Component  Value Date   TRIG 65 01/26/2020   Lab Results  Component Value Date   CHOLHDL 4.2 01/26/2020   Lab Results  Component Value Date   HGBA1C 5.6 01/26/2020      Assessment & Plan:   Problem List Items Addressed This Visit       Cardiovascular and Mediastinum   Headache, migraine - Primary    Stable at the present time        Nervous and Auditory   Cervical dystonia    Chronic problem        Musculoskeletal and Integument   Osteoporosis, post-menopausal    Advised to take vitamin D      Arthropathy, traumatic, knee    Stable at the present time        Other   Mild cognitive impairment    No further deterioration      Other Visit Diagnoses     Gait abnormality           No orders of the defined types were placed in this encounter.   Follow-up: No follow-ups on file.    Corky Downs, MD

## 2021-02-25 NOTE — Assessment & Plan Note (Signed)
Stable at the present time. 

## 2021-02-25 NOTE — Assessment & Plan Note (Signed)
No further deterioration 

## 2021-02-25 NOTE — Assessment & Plan Note (Signed)
Chronic problem. 

## 2021-02-25 NOTE — Assessment & Plan Note (Signed)
Advised to take vitamin D

## 2021-02-27 ENCOUNTER — Ambulatory Visit (INDEPENDENT_AMBULATORY_CARE_PROVIDER_SITE_OTHER): Payer: Medicare HMO | Admitting: *Deleted

## 2021-02-27 DIAGNOSIS — Z0000 Encounter for general adult medical examination without abnormal findings: Secondary | ICD-10-CM | POA: Diagnosis not present

## 2021-02-27 NOTE — Progress Notes (Signed)
Subjective:   Gabriella Rogers is a 87 y.o. female who presents for Medicare Annual (Subsequent) preventive examination. Visit performed using audio  Review of Systems    Defer to provider  Cardiac Risk Factors include: none     Objective:    Today's Vitals   02/27/21 1441  PainSc: 0-No pain   There is no height or weight on file to calculate BMI.  Advanced Directives 02/27/2021 01/26/2020 01/25/2020 07/20/2016 06/22/2015  Does Patient Have a Medical Advance Directive? Yes Yes Yes No No  Type of Advance Directive Living will Healthcare Power of [employer] Power of Attorney - -  Does patient want to make changes to medical advance directive? - No - Patient declined - - -  Copy of Healthcare Power of Attorney in Chart? - No - copy requested - - -    Current Medications (verified) Outpatient Encounter Medications as of 02/27/2021  Medication Sig   acetaminophen (TYLENOL) 325 MG tablet Take 650 mg by mouth every 6 (six) hours as needed.   aspirin 325 MG tablet Take 1 tablet (325 mg total) by mouth daily.   atorvastatin (LIPITOR) 80 MG tablet Take 1 tablet (80 mg total) by mouth daily at 6 PM.   clonazePAM (KLONOPIN) 0.5 MG tablet Take 0.5 mg by mouth 2 (two) times daily as needed.    clopidogrel (PLAVIX) 75 MG tablet Take 1 tablet (75 mg total) by mouth daily.   donepezil (ARICEPT) 10 MG tablet Take 1 tablet (10 mg total) by mouth at bedtime.   Multiple Vitamins-Minerals (ONE-A-DAY WOMENS PO) Take 1 tablet by mouth daily.   No facility-administered encounter medications on file as of 02/27/2021.    Allergies (verified) Actonel  [risedronate sodium], Piper, Pneumococcal vaccine, Risedronate, Alendronate, Aleve [naproxen sodium], and Pravastatin   History: Past Medical History:  Diagnosis Date   Brain disorder 03/27/2014   Cervical dystonia 01/24/2012   Clinical depression 11/26/2015   Dizziness 11/26/2015   Overview:  Chronic, felt secondary to inner ear per ENT evaluation  approximately 1996.    H/O total knee replacement 11/23/2015   Headache, migraine 11/26/2015   HLD (hyperlipidemia) 11/26/2015   Hypercholesteremia    Osteoporosis, post-menopausal 11/26/2015   Past Surgical History:  Procedure Laterality Date   ABDOMINAL HYSTERECTOMY     ANKLE SURGERY Left    KNEE SURGERY Right    Family History  Problem Relation Age of Onset   Stroke Maternal Grandfather    Stroke Paternal Grandfather    Kidney cancer Neg Hx    Hematuria Neg Hx    Social History   Socioeconomic History   Marital status: Married    Spouse name: Not on file   Number of children: Not on file   Years of education: Not on file   Highest education level: Not on file  Occupational History   Not on file  Tobacco Use   Smoking status: Never   Smokeless tobacco: Never  Substance and Sexual Activity   Alcohol use: No   Drug use: Not on file   Sexual activity: Not on file  Other Topics Concern   Not on file  Social History Narrative   Not on file   Social Determinants of Health   Financial Resource Strain: Low Risk    Difficulty of Paying Living Expenses: Not hard at all  Food Insecurity: No Food Insecurity   Worried About Radiation protection practitioner of Food in the Last Year: Never true   Barista in  the Last Year: Never true  Transportation Needs: No Transportation Needs   Lack of Transportation (Medical): No   Lack of Transportation (Non-Medical): No  Physical Activity: Inactive   Days of Exercise per Week: 0 days   Minutes of Exercise per Session: 0 min  Stress: No Stress Concern Present   Feeling of Stress : Only a little  Social Connections: Press photographerocially Integrated   Frequency of Communication with Friends and Family: Three times a week   Frequency of Social Gatherings with Friends and Family: Three times a week   Attends Religious Services: 1 to 4 times per year   Active Member of Clubs or Organizations: Yes   Attends BankerClub or Organization Meetings: 1 to 4 times per year    Marital Status: Married    Tobacco Counseling Counseling given: Not Answered   Clinical Intake:  Pre-visit preparation completed: Yes  Pain : No/denies pain Pain Score: 0-No pain     Nutritional Risks: None Diabetes: No  How often do you need to have someone help you when you read instructions, pamphlets, or other written materials from your doctor or pharmacy?: 2 - Rarely What is the last grade level you completed in school?: 12  Diabetic?no  Interpreter Needed?: No  Information entered by :: Melody ComasAshley Braxtyn Bojarski CMA   Activities of Daily Living In your present state of health, do you have any difficulty performing the following activities: 02/27/2021  Hearing? N  Vision? N  Difficulty concentrating or making decisions? N  Walking or climbing stairs? Y  Comment to some degree  Dressing or bathing? N  Doing errands, shopping? Y  Comment due to not driving  Preparing Food and eating ? N  Using the Toilet? N  In the past six months, have you accidently leaked urine? N  Do you have problems with loss of bowel control? N  Managing your Medications? N  Managing your Finances? N  Housekeeping or managing your Housekeeping? N  Some recent data might be hidden    Patient Care Team: Corky DownsMasoud, Javed, MD as PCP - General (Internal Medicine)  Indicate any recent Medical Services you may have received from other than [facility] providers in the past year (date may be approximate).     Assessment:   This is a routine wellness examination for Nettie ElmSylvia.  Hearing/Vision screen No results found.  Dietary issues and exercise activities discussed: Current Exercise Habits: The patient does not participate in regular exercise at present, Exercise limited by: neurologic condition(s)   Goals Addressed   None    Depression Screen PHQ 2/9 Scores 02/27/2021 02/25/2021 01/07/2021  PHQ - 2 Score 0 0 1    Fall Risk Fall Risk  02/25/2021 01/07/2021  Falls in the past year? 0 0  Number falls in  past yr: 0 0  Injury with Fall? 0 0  Risk for fall due to : No Fall Risks;Impaired mobility History of fall(s)  Follow up Falls evaluation completed Falls evaluation completed    FALL RISK PREVENTION PERTAINING TO THE HOME:  Any stairs in or around the home? Yes - going down to basement- patient denies every going down there  If so, are there any without handrails? No  Home free of loose throw rugs in walkways, pet beds, electrical cords, etc? Yes  Adequate lighting in your home to reduce risk of falls? Yes   ASSISTIVE DEVICES UTILIZED TO PREVENT FALLS:  Life alert? No  Use of a cane, walker or w/c? Yes  Grab bars in  the bathroom? Yes  Shower chair or bench in shower? Yes  Elevated toilet seat or a handicapped toilet? No   TIMED UP AND GO:  Was the test performed? No .  Length of time to ambulate- not performed   Gait slow and steady with assistive device  Cognitive Function: MMSE - Mini Mental State Exam 02/27/2021  Orientation to time 5  Orientation to Place 5  Registration 3  Attention/ Calculation 5  Recall 3  Language- name 2 objects 2  Language- repeat 1  Language- follow 3 step command 3  Language- read & follow direction 1  Write a sentence 0  Copy design 0  Total score 28     6CIT Screen 02/27/2021  What Year? 0 points  What month? 3 points  What time? 0 points  Count back from 20 0 points  Months in reverse 2 points  Repeat phrase 0 points  Total Score 5    Immunizations  There is no immunization history on file for this patient.  TDAP status: Due, Education has been provided regarding the importance of this vaccine. Advised may receive this vaccine at local pharmacy or Health Dept. Aware to provide a copy of the vaccination record if obtained from local pharmacy or Health Dept. Verbalized acceptance and understanding.  Flu Vaccine status: Declined, Education has been provided regarding the importance of this vaccine but patient still declined.  Advised may receive this vaccine at local pharmacy or Health Dept. Aware to provide a copy of the vaccination record if obtained from local pharmacy or Health Dept. Verbalized acceptance and understanding.  Pneumococcal vaccine status: Declined,  Education has been provided regarding the importance of this vaccine but patient still declined. Advised may receive this vaccine at local pharmacy or Health Dept. Aware to provide a copy of the vaccination record if obtained from local pharmacy or Health Dept. Verbalized acceptance and understanding.   Covid-19 vaccine status: Declined, Education has been provided regarding the importance of this vaccine but patient still declined. Advised may receive this vaccine at local pharmacy or Health Dept.or vaccine clinic. Aware to provide a copy of the vaccination record if obtained from local pharmacy or Health Dept. Verbalized acceptance and understanding.  Qualifies for Shingles Vaccine? Yes   Zostavax completed No   Shingrix Completed?: No.    Education has been provided regarding the importance of this vaccine. Patient has been advised to call insurance company to determine out of pocket expense if they have not yet received this vaccine. Advised may also receive vaccine at local pharmacy or Health Dept. Verbalized acceptance and understanding.  Screening Tests Health Maintenance  Topic Date Due   COVID-19 Vaccine (1) Never done   TETANUS/TDAP  Never done   Zoster Vaccines- Shingrix (1 of 2) Never done   PNA vac Low Risk Adult (2 of 2 - PCV13) 08/12/2011   INFLUENZA VACCINE  Never done   DEXA SCAN  Completed   HPV VACCINES  Aged Out    Health Maintenance  Health Maintenance Due  Topic Date Due   COVID-19 Vaccine (1) Never done   TETANUS/TDAP  Never done   Zoster Vaccines- Shingrix (1 of 2) Never done   PNA vac Low Risk Adult (2 of 2 - PCV13) 08/12/2011   INFLUENZA VACCINE  Never done    Colorectal cancer screening: Referral to GI placed once  patient calls to let us know when she will be available. Pt aware the office will call re: appt.  Mammogram  status: No longer required due to age.  Bone Density status: Completed 2017. Results reflect: Bone density results: NORMAL. Repeat every 2 years. Patient is due for bone density but declines a present moment  Lung Cancer Screening: (Low Dose CT Chest recommended if Age 19-80 years, 30 pack-year currently smoking OR have quit w/in 15years.) does not qualify.   Lung Cancer Screening Referral: NA  Additional Screening:  Hepatitis C Screening: does qualify; Completed Not completed   Vision Screening: Recommended annual ophthalmology exams for early detection of glaucoma and other disorders of the eye. Is the patient up to date with their annual eye exam?  No - has apt scheduled in DEC  Who is the provider or what is the name of the office in which the patient attends annual eye exams? [facility]  If pt is not established with a provider, would they like to be referred to a provider to establish care?  Patient already established  .   Dental Screening: Recommended annual dental exams for proper oral hygiene  Community Resource Referral / Chronic Care Management: CRR required this visit?  No   CCM required this visit?  No      Plan:     I have personally reviewed and noted the following in the patient's chart:   Medical and social history Use of alcohol, tobacco or illicit drugs  Current medications and supplements including opioid prescriptions.  Functional ability and status Nutritional status Physical activity Advanced directives List of other physicians Hospitalizations, surgeries, and ER visits in previous 12 months Vitals Screenings to include cognitive, depression, and falls Referrals and appointments  In addition, I have reviewed and discussed with patient certain preventive protocols, quality metrics, and best practice recommendations. A written  personalized care plan for preventive services as well as general preventive health recommendations were provided to patient.     Melody Comas, CMA   02/27/2021   Nurse Notes:  Ms. Bise , Thank you for taking time to come for your Medicare Wellness Visit. I appreciate your ongoing commitment to your health goals. Please review the following plan we discussed and let me know if I can assist you in the future.   These are the goals we discussed:  Goals   None     This is a list of the screening recommended for you and due dates:  Health Maintenance  Topic Date Due   COVID-19 Vaccine (1) Never done   Tetanus Vaccine  Never done   Zoster (Shingles) Vaccine (1 of 2) Never done   Pneumonia vaccines (2 of 2 - PCV13) 08/12/2011   Flu Shot  Never done   DEXA scan (bone density measurement)  Completed   HPV Vaccine  Aged Out      Time spent with patient 40 min

## 2021-02-28 NOTE — Progress Notes (Signed)
I have reviewed this visit and agree with the documentation.   

## 2021-03-23 ENCOUNTER — Encounter: Payer: Medicare HMO | Admitting: Internal Medicine

## 2021-04-13 ENCOUNTER — Ambulatory Visit (INDEPENDENT_AMBULATORY_CARE_PROVIDER_SITE_OTHER): Payer: Medicare HMO | Admitting: Internal Medicine

## 2021-04-13 ENCOUNTER — Encounter: Payer: Self-pay | Admitting: Internal Medicine

## 2021-04-13 ENCOUNTER — Other Ambulatory Visit: Payer: Self-pay

## 2021-04-13 VITALS — BP 153/83 | HR 84 | Ht 63.0 in | Wt 121.4 lb

## 2021-04-13 DIAGNOSIS — N390 Urinary tract infection, site not specified: Secondary | ICD-10-CM

## 2021-04-13 DIAGNOSIS — N342 Other urethritis: Secondary | ICD-10-CM

## 2021-04-13 DIAGNOSIS — M810 Age-related osteoporosis without current pathological fracture: Secondary | ICD-10-CM | POA: Diagnosis not present

## 2021-04-13 DIAGNOSIS — R42 Dizziness and giddiness: Secondary | ICD-10-CM

## 2021-04-13 DIAGNOSIS — G243 Spasmodic torticollis: Secondary | ICD-10-CM

## 2021-04-13 DIAGNOSIS — N3000 Acute cystitis without hematuria: Secondary | ICD-10-CM

## 2021-04-13 LAB — POCT URINALYSIS DIPSTICK
Bilirubin, UA: NEGATIVE
Blood, UA: NEGATIVE
Glucose, UA: NEGATIVE
Nitrite, UA: POSITIVE
Protein, UA: POSITIVE — AB
Spec Grav, UA: 1.025 (ref 1.010–1.025)
Urobilinogen, UA: 1 E.U./dL
pH, UA: 6 (ref 5.0–8.0)

## 2021-04-13 MED ORDER — NITROFURANTOIN MACROCRYSTAL 100 MG PO CAPS
100.0000 mg | ORAL_CAPSULE | Freq: Four times a day (QID) | ORAL | 0 refills | Status: DC
Start: 2021-04-13 — End: 2021-05-18

## 2021-04-13 NOTE — Assessment & Plan Note (Signed)
Patient was started on Macrodantin

## 2021-04-13 NOTE — Assessment & Plan Note (Signed)
It is a chronic problem.  She is under care of a neurologist, she gets Botox injections

## 2021-04-13 NOTE — Assessment & Plan Note (Signed)
Stable at the present time, patient can get up from the chair without any help

## 2021-04-13 NOTE — Assessment & Plan Note (Signed)
Patient has some frequency and urgency of micturition, she is not tender in the bladder area

## 2021-04-13 NOTE — Progress Notes (Signed)
Established Patient Office Visit  Subjective:  Patient ID: Gabriella Rogers, female    DOB: 03/30/1934  Age: 87 y.o. MRN: [id number]  CC:  Chief Complaint  Patient presents with   Urinary Tract Infection    Patients complains of pain and burning sensation while urinating x 3 days.    HPI  Gabriella Rogers presents for cervical dystonia, and uti  Past Medical History:  Diagnosis Date   Brain disorder 03/27/2014   Cervical dystonia 01/24/2012   Clinical depression 11/26/2015   Dizziness 11/26/2015   Overview:  Chronic, felt secondary to inner ear per ENT evaluation approximately 1996.    H/O total knee replacement 11/23/2015   Headache, migraine 11/26/2015   HLD (hyperlipidemia) 11/26/2015   Hypercholesteremia    Osteoporosis, post-menopausal 11/26/2015    Past Surgical History:  Procedure Laterality Date   ABDOMINAL HYSTERECTOMY     ANKLE SURGERY Left    KNEE SURGERY Right     Family History  Problem Relation Age of Onset   Stroke Maternal Grandfather    Stroke Paternal Grandfather    Kidney cancer Neg Hx    Hematuria Neg Hx     Social History   Socioeconomic History   Marital status: Married    Spouse name: Not on file   Number of children: Not on file   Years of education: Not on file   Highest education level: Not on file  Occupational History   Not on file  Tobacco Use   Smoking status: Never   Smokeless tobacco: Never  Substance and Sexual Activity   Alcohol use: No   Drug use: Not on file   Sexual activity: Not on file  Other Topics Concern   Not on file  Social History Narrative   Not on file   Social Determinants of Health   Financial Resource Strain: Low Risk    Difficulty of Paying Living Expenses: Not hard at all  Food Insecurity: No Food Insecurity   Worried About Programme researcher, broadcasting/film/video in the Last Year: Never true   Ran Out of Food in the Last Year: Never true  Transportation Needs: No Transportation Needs   Lack of Transportation (Medical): No    Lack of Transportation (Non-Medical): No  Physical Activity: Inactive   Days of Exercise per Week: 0 days   Minutes of Exercise per Session: 0 min  Stress: No Stress Concern Present   Feeling of Stress : Only a little  Social Connections: Press photographer of Communication with Friends and Family: Three times a week   Frequency of Social Gatherings with Friends and Family: Three times a week   Attends Religious Services: 1 to 4 times per year   Active Member of Clubs or Organizations: Yes   Attends Banker Meetings: 1 to 4 times per year   Marital Status: Married  Catering manager Violence: Not At Risk   Fear of Current or Ex-Partner: No   Emotionally Abused: No   Physically Abused: No   Sexually Abused: No     Current Outpatient Medications:    nitrofurantoin (MACRODANTIN) 100 MG capsule, Take 1 capsule (100 mg total) by mouth 4 (four) times daily., Disp: 40 capsule, Rfl: 0   acetaminophen (TYLENOL) 325 MG tablet, Take 650 mg by mouth every 6 (six) hours as needed., Disp: , Rfl:    aspirin 325 MG tablet, Take 1 tablet (325 mg total) by mouth daily., Disp: 30 tablet, Rfl: 0  atorvastatin (LIPITOR) 80 MG tablet, Take 1 tablet (80 mg total) by mouth daily at 6 PM., Disp: 90 tablet, Rfl: 3   clonazePAM (KLONOPIN) 0.5 MG tablet, Take 0.5 mg by mouth 2 (two) times daily as needed. , Disp: , Rfl:    clopidogrel (PLAVIX) 75 MG tablet, Take 1 tablet (75 mg total) by mouth daily., Disp: 90 tablet, Rfl: 3   donepezil (ARICEPT) 10 MG tablet, Take 1 tablet (10 mg total) by mouth at bedtime., Disp: 90 tablet, Rfl: 3   Multiple Vitamins-Minerals (ONE-A-DAY WOMENS PO), Take 1 tablet by mouth daily., Disp: , Rfl:    Allergies  Allergen Reactions   Actonel  [Risedronate Sodium]    Piper    Pneumococcal Vaccine Other (See Comments)    WEAKNESS, DISORIENTED, "ARM SORE, RED, HURTING"   Risedronate Other (See Comments)    REDNESS IN FACE   Alendronate Rash   Aleve  [Naproxen Sodium] Rash   Pravastatin Rash    ROS Review of Systems  Constitutional: Negative.   HENT: Negative.    Eyes: Negative.   Respiratory: Negative.    Cardiovascular: Negative.   Gastrointestinal: Negative.   Endocrine: Negative.   Genitourinary: Negative.   Musculoskeletal: Negative.   Skin: Negative.   Allergic/Immunologic: Negative.   Neurological: Negative.   Hematological: Negative.   Psychiatric/Behavioral: Negative.    All other systems reviewed and are negative.    Objective:    Physical Exam Vitals reviewed.  Constitutional:      Appearance: Normal appearance.  HENT:     Mouth/Throat:     Mouth: Mucous membranes are moist.  Eyes:     Pupils: Pupils are equal, round, and reactive to light.  Neck:     Vascular: No carotid bruit.  Cardiovascular:     Rate and Rhythm: Normal rate and regular rhythm.     Pulses: Normal pulses.     Heart sounds: Normal heart sounds.  Pulmonary:     Effort: Pulmonary effort is normal.     Breath sounds: Normal breath sounds.  Abdominal:     General: Bowel sounds are normal.     Palpations: Abdomen is soft. There is no hepatomegaly, splenomegaly or mass.     Tenderness: There is no abdominal tenderness.     Hernia: No hernia is present.  Musculoskeletal:        General: No tenderness.     Cervical back: Neck supple.     Right lower leg: No edema.     Left lower leg: No edema.  Skin:    Findings: No rash.  Neurological:     Mental Status: She is alert and oriented to person, place, and time. Mental status is at baseline.     Motor: Weakness present.     Comments: Cervical dystonia  Psychiatric:        Mood and Affect: Mood and affect normal.        Behavior: Behavior normal.    BP (!) 153/83   Pulse 84   Ht 5\' 3"  (1.6 m)   Wt 121 lb 6.4 oz (55.1 kg)   BMI 21.51 kg/m  Wt Readings from Last 3 Encounters:  04/13/21 121 lb 6.4 oz (55.1 kg)  02/25/21 118 lb 8 oz (53.8 kg)  01/07/21 118 lb (53.5 kg)      Health Maintenance Due  Topic Date Due   COVID-19 Vaccine (1) Never done   Pneumonia Vaccine 54+ Years old (1 - PCV) Never done   TETANUS/TDAP  Never done   Zoster Vaccines- Shingrix (1 of 2) Never done   INFLUENZA VACCINE  Never done    There are no preventive care reminders to display for this patient.  No results found for: TSH Lab Results  Component Value Date   WBC 6.9 01/31/2020   HGB 13.3 01/31/2020   HCT 38.5 01/31/2020   MCV 101.3 (H) 01/31/2020   PLT 328 01/31/2020   Lab Results  Component Value Date   NA 139 01/31/2020   K 3.9 01/31/2020   CO2 22 01/31/2020   GLUCOSE 82 01/31/2020   BUN 22 01/31/2020   CREATININE 0.83 01/31/2020   BILITOT 0.9 01/25/2020   ALKPHOS 72 01/25/2020   AST 21 01/25/2020   ALT 14 01/25/2020   PROT 7.9 01/25/2020   ALBUMIN 4.2 01/25/2020   CALCIUM 9.2 01/31/2020   ANIONGAP 9 01/31/2020   Lab Results  Component Value Date   CHOL 274 (H) 01/26/2020   Lab Results  Component Value Date   HDL 66 01/26/2020   Lab Results  Component Value Date   LDLCALC 195 (H) 01/26/2020   Lab Results  Component Value Date   TRIG 65 01/26/2020   Lab Results  Component Value Date   CHOLHDL 4.2 01/26/2020   Lab Results  Component Value Date   HGBA1C 5.6 01/26/2020      Assessment & Plan:   Problem List Items Addressed This Visit       Nervous and Auditory   Cervical dystonia    It is a chronic problem.  She is under care of a neurologist, she gets Botox injections        Musculoskeletal and Integument   Osteoporosis, post-menopausal    Stable at the present time        Genitourinary   Urinary tract infection without hematuria - Primary    Patient was started on Macrodantin      Relevant Medications   nitrofurantoin (MACRODANTIN) 100 MG capsule   Other Relevant Orders   POCT urinalysis dipstick (Completed)   Urinalysis   Acute cystitis without hematuria    Patient has some frequency and urgency of  micturition, she is not tender in the bladder area      Urethritis    Started on Macrodantin        Other   Dizziness    Stable at the present time, patient can get up from the chair without any help     Hypercholesterolemia  I advised the patient to follow Mediterranean diet This diet is rich in fruits vegetables and whole grain, and This diet is also rich in fish and lean meat Patient should also eat a handful of almonds or walnuts daily Recent heart study indicated that average follow-up on this kind of diet reduces the cardiovascular mortality by 50 to 70%==  Meds ordered this encounter  Medications   nitrofurantoin (MACRODANTIN) 100 MG capsule    Sig: Take 1 capsule (100 mg total) by mouth 4 (four) times daily.    Dispense:  40 capsule    Refill:  0    Follow-up: Return in 2 months (on 06/13/2021).    Corky Downs, MD

## 2021-04-13 NOTE — Assessment & Plan Note (Signed)
Started on Macrodantin 

## 2021-04-13 NOTE — Assessment & Plan Note (Signed)
Stable at the present time. 

## 2021-05-18 ENCOUNTER — Other Ambulatory Visit: Payer: Self-pay

## 2021-05-18 MED ORDER — NITROFURANTOIN MACROCRYSTAL 100 MG PO CAPS: 100.0000 mg | ORAL_CAPSULE | Freq: Four times a day (QID) | ORAL | 0 refills | Status: DC

## 2021-05-27 ENCOUNTER — Ambulatory Visit: Payer: Medicare HMO | Admitting: Internal Medicine

## 2021-08-11 ENCOUNTER — Other Ambulatory Visit: Payer: Self-pay | Admitting: *Deleted

## 2021-08-11 MED ORDER — NITROFURANTOIN MACROCRYSTAL 100 MG PO CAPS: 100.0000 mg | ORAL_CAPSULE | Freq: Four times a day (QID) | ORAL | 0 refills | Status: DC

## 2021-09-29 ENCOUNTER — Other Ambulatory Visit: Payer: Self-pay | Admitting: Internal Medicine

## 2021-10-06 ENCOUNTER — Inpatient Hospital Stay
Admission: EM | Admit: 2021-10-06 | Discharge: 2021-10-15 | DRG: 065 | Disposition: A | Discharge status: Skilled Nursing Facility | Payer: Medicare HMO | Attending: Student in an Organized Health Care Education/Training Program | Admitting: Student in an Organized Health Care Education/Training Program

## 2021-10-06 ENCOUNTER — Other Ambulatory Visit: Payer: Self-pay

## 2021-10-06 DIAGNOSIS — H9213 Otorrhea, bilateral: Secondary | ICD-10-CM

## 2021-10-06 DIAGNOSIS — Z7982 Long term (current) use of aspirin: Secondary | ICD-10-CM

## 2021-10-06 DIAGNOSIS — E876 Hypokalemia: Secondary | ICD-10-CM

## 2021-10-06 DIAGNOSIS — I639 Cerebral infarction, unspecified: Secondary | ICD-10-CM | POA: Diagnosis not present

## 2021-10-06 DIAGNOSIS — H903 Sensorineural hearing loss, bilateral: Secondary | ICD-10-CM

## 2021-10-06 DIAGNOSIS — D539 Nutritional anemia, unspecified: Secondary | ICD-10-CM

## 2021-10-06 DIAGNOSIS — R509 Fever, unspecified: Secondary | ICD-10-CM | POA: Diagnosis present

## 2021-10-06 DIAGNOSIS — R54 Age-related physical debility: Secondary | ICD-10-CM | POA: Diagnosis present

## 2021-10-06 DIAGNOSIS — I6389 Other cerebral infarction: Secondary | ICD-10-CM | POA: Diagnosis not present

## 2021-10-06 DIAGNOSIS — Z20822 Contact with and (suspected) exposure to covid-19: Secondary | ICD-10-CM | POA: Diagnosis present

## 2021-10-06 DIAGNOSIS — R531 Weakness: Secondary | ICD-10-CM

## 2021-10-06 DIAGNOSIS — R4189 Other symptoms and signs involving cognitive functions and awareness: Secondary | ICD-10-CM | POA: Diagnosis present

## 2021-10-06 DIAGNOSIS — K5900 Constipation, unspecified: Secondary | ICD-10-CM | POA: Diagnosis present

## 2021-10-06 DIAGNOSIS — G249 Dystonia, unspecified: Secondary | ICD-10-CM | POA: Diagnosis present

## 2021-10-06 DIAGNOSIS — G243 Spasmodic torticollis: Secondary | ICD-10-CM | POA: Diagnosis present

## 2021-10-06 DIAGNOSIS — M1711 Unilateral primary osteoarthritis, right knee: Secondary | ICD-10-CM | POA: Diagnosis present

## 2021-10-06 DIAGNOSIS — Z66 Do not resuscitate: Secondary | ICD-10-CM | POA: Diagnosis present

## 2021-10-06 DIAGNOSIS — G43909 Migraine, unspecified, not intractable, without status migrainosus: Secondary | ICD-10-CM | POA: Diagnosis present

## 2021-10-06 DIAGNOSIS — G3184 Mild cognitive impairment, so stated: Secondary | ICD-10-CM | POA: Diagnosis present

## 2021-10-06 DIAGNOSIS — F0393 Unspecified dementia, unspecified severity, with mood disturbance: Secondary | ICD-10-CM | POA: Diagnosis present

## 2021-10-06 DIAGNOSIS — E7800 Pure hypercholesterolemia, unspecified: Secondary | ICD-10-CM | POA: Diagnosis present

## 2021-10-06 DIAGNOSIS — Z823 Family history of stroke: Secondary | ICD-10-CM

## 2021-10-06 DIAGNOSIS — N179 Acute kidney failure, unspecified: Secondary | ICD-10-CM

## 2021-10-06 DIAGNOSIS — D72829 Elevated white blood cell count, unspecified: Secondary | ICD-10-CM

## 2021-10-06 DIAGNOSIS — Z7902 Long term (current) use of antithrombotics/antiplatelets: Secondary | ICD-10-CM

## 2021-10-06 DIAGNOSIS — E538 Deficiency of other specified B group vitamins: Secondary | ICD-10-CM | POA: Diagnosis present

## 2021-10-06 DIAGNOSIS — F32A Depression, unspecified: Secondary | ICD-10-CM | POA: Diagnosis present

## 2021-10-06 DIAGNOSIS — R29702 NIHSS score 2: Secondary | ICD-10-CM | POA: Diagnosis present

## 2021-10-06 LAB — CBG MONITORING, ED: Glucose-Capillary: 131 mg/dL — ABNORMAL HIGH (ref 70–99)

## 2021-10-06 NOTE — ED Triage Notes (Addendum)
Arrived via EMS from home, husband states he last saw patient well last night. States when he got home around 0930 patient had left facial droop, slurred speech and generalized weakness. EMS reports LVO 3. Patient alert with mild left facial droop, resp even, unlabored on RA.  ?

## 2021-10-07 ENCOUNTER — Observation Stay: Payer: Medicare HMO

## 2021-10-07 ENCOUNTER — Emergency Department: Payer: Medicare HMO

## 2021-10-07 ENCOUNTER — Observation Stay
Admit: 2021-10-07 | Discharge: 2021-10-07 | Disposition: A | Discharge status: Home / Self Care | Payer: Medicare HMO | Attending: Internal Medicine | Admitting: Internal Medicine

## 2021-10-07 DIAGNOSIS — N179 Acute kidney failure, unspecified: Secondary | ICD-10-CM

## 2021-10-07 DIAGNOSIS — R531 Weakness: Secondary | ICD-10-CM | POA: Diagnosis present

## 2021-10-07 DIAGNOSIS — F0393 Unspecified dementia, unspecified severity, with mood disturbance: Secondary | ICD-10-CM | POA: Diagnosis present

## 2021-10-07 DIAGNOSIS — R54 Age-related physical debility: Secondary | ICD-10-CM | POA: Diagnosis present

## 2021-10-07 DIAGNOSIS — I6389 Other cerebral infarction: Secondary | ICD-10-CM | POA: Diagnosis present

## 2021-10-07 DIAGNOSIS — R509 Fever, unspecified: Secondary | ICD-10-CM | POA: Diagnosis present

## 2021-10-07 DIAGNOSIS — G249 Dystonia, unspecified: Secondary | ICD-10-CM | POA: Diagnosis present

## 2021-10-07 DIAGNOSIS — R4189 Other symptoms and signs involving cognitive functions and awareness: Secondary | ICD-10-CM | POA: Diagnosis present

## 2021-10-07 DIAGNOSIS — Z20822 Contact with and (suspected) exposure to covid-19: Secondary | ICD-10-CM | POA: Diagnosis present

## 2021-10-07 DIAGNOSIS — D72829 Elevated white blood cell count, unspecified: Secondary | ICD-10-CM

## 2021-10-07 DIAGNOSIS — H9213 Otorrhea, bilateral: Secondary | ICD-10-CM | POA: Diagnosis present

## 2021-10-07 DIAGNOSIS — Z823 Family history of stroke: Secondary | ICD-10-CM | POA: Diagnosis not present

## 2021-10-07 DIAGNOSIS — E876 Hypokalemia: Secondary | ICD-10-CM

## 2021-10-07 DIAGNOSIS — I639 Cerebral infarction, unspecified: Secondary | ICD-10-CM | POA: Diagnosis present

## 2021-10-07 DIAGNOSIS — E538 Deficiency of other specified B group vitamins: Secondary | ICD-10-CM | POA: Diagnosis present

## 2021-10-07 DIAGNOSIS — D539 Nutritional anemia, unspecified: Secondary | ICD-10-CM | POA: Diagnosis present

## 2021-10-07 DIAGNOSIS — G43909 Migraine, unspecified, not intractable, without status migrainosus: Secondary | ICD-10-CM | POA: Diagnosis present

## 2021-10-07 DIAGNOSIS — R29702 NIHSS score 2: Secondary | ICD-10-CM | POA: Diagnosis present

## 2021-10-07 DIAGNOSIS — M1711 Unilateral primary osteoarthritis, right knee: Secondary | ICD-10-CM | POA: Diagnosis present

## 2021-10-07 DIAGNOSIS — Z7902 Long term (current) use of antithrombotics/antiplatelets: Secondary | ICD-10-CM | POA: Diagnosis not present

## 2021-10-07 DIAGNOSIS — Z7982 Long term (current) use of aspirin: Secondary | ICD-10-CM | POA: Diagnosis not present

## 2021-10-07 DIAGNOSIS — Z66 Do not resuscitate: Secondary | ICD-10-CM | POA: Diagnosis present

## 2021-10-07 DIAGNOSIS — K5900 Constipation, unspecified: Secondary | ICD-10-CM | POA: Diagnosis present

## 2021-10-07 DIAGNOSIS — E7800 Pure hypercholesterolemia, unspecified: Secondary | ICD-10-CM | POA: Diagnosis present

## 2021-10-07 DIAGNOSIS — G243 Spasmodic torticollis: Secondary | ICD-10-CM | POA: Diagnosis present

## 2021-10-07 LAB — URINE DRUG SCREEN, QUALITATIVE (ARMC ONLY)
Amphetamines, Ur Screen: NOT DETECTED
Barbiturates, Ur Screen: NOT DETECTED
Benzodiazepine, Ur Scrn: POSITIVE — AB
Cannabinoid 50 Ng, Ur ~~LOC~~: NOT DETECTED
Cocaine Metabolite,Ur ~~LOC~~: NOT DETECTED
MDMA (Ecstasy)Ur Screen: NOT DETECTED
Methadone Scn, Ur: NOT DETECTED
Opiate, Ur Screen: NOT DETECTED
Phencyclidine (PCP) Ur S: NOT DETECTED
Tricyclic, Ur Screen: NOT DETECTED

## 2021-10-07 LAB — ECHOCARDIOGRAM COMPLETE
AR max vel: 2.04 cm2
AV Area VTI: 2.16 cm2
AV Area mean vel: 2.23 cm2
AV Mean grad: 10 mmHg
AV Peak grad: 17.5 mmHg
Ao pk vel: 2.09 m/s
Area-P 1/2: 3.17 cm2
MV VTI: 4.04 cm2
S' Lateral: 2.46 cm
Weight: 1922.41 oz

## 2021-10-07 LAB — LACTIC ACID, PLASMA: Lactic Acid, Venous: 0.9 mmol/L (ref 0.5–1.9)

## 2021-10-07 LAB — URINALYSIS, ROUTINE W REFLEX MICROSCOPIC
Bilirubin Urine: NEGATIVE
Glucose, UA: NEGATIVE mg/dL
Ketones, ur: NEGATIVE mg/dL
Leukocytes,Ua: NEGATIVE
Nitrite: NEGATIVE
Protein, ur: 100 mg/dL — AB
Specific Gravity, Urine: 1.028 (ref 1.005–1.030)
pH: 5 (ref 5.0–8.0)

## 2021-10-07 LAB — RESP PANEL BY RT-PCR (FLU A&B, COVID) ARPGX2
Influenza A by PCR: NEGATIVE
Influenza B by PCR: NEGATIVE
SARS Coronavirus 2 by RT PCR: NEGATIVE

## 2021-10-07 LAB — PROCALCITONIN: Procalcitonin: 0.12 ng/mL

## 2021-10-07 LAB — COMPREHENSIVE METABOLIC PANEL
ALT: 14 U/L (ref 0–44)
AST: 36 U/L (ref 15–41)
Albumin: 3.6 g/dL (ref 3.5–5.0)
Alkaline Phosphatase: 86 U/L (ref 38–126)
Anion gap: 9 (ref 5–15)
BUN: 19 mg/dL (ref 8–23)
CO2: 26 mmol/L (ref 22–32)
Calcium: 8.7 mg/dL — ABNORMAL LOW (ref 8.9–10.3)
Chloride: 102 mmol/L (ref 98–111)
Creatinine, Ser: 1.21 mg/dL — ABNORMAL HIGH (ref 0.44–1.00)
GFR, Estimated: 44 mL/min — ABNORMAL LOW (ref >60)
Glucose, Bld: 120 mg/dL — ABNORMAL HIGH (ref 70–99)
Potassium: 3 mmol/L — ABNORMAL LOW (ref 3.5–5.1)
Sodium: 137 mmol/L (ref 135–145)
Total Bilirubin: 1.1 mg/dL (ref 0.3–1.2)
Total Protein: 7.6 g/dL (ref 6.5–8.1)

## 2021-10-07 LAB — FERRITIN: Ferritin: 35 ng/mL (ref 11–307)

## 2021-10-07 LAB — DIFFERENTIAL
Abs Immature Granulocytes: 0.07 10*3/uL (ref 0.00–0.07)
Basophils Absolute: 0 10*3/uL (ref 0.0–0.1)
Basophils Relative: 0 %
Eosinophils Absolute: 0 10*3/uL (ref 0.0–0.5)
Eosinophils Relative: 0 %
Immature Granulocytes: 1 %
Lymphocytes Relative: 6 %
Lymphs Abs: 0.7 10*3/uL (ref 0.7–4.0)
Monocytes Absolute: 0.7 10*3/uL (ref 0.1–1.0)
Monocytes Relative: 6 %
Neutro Abs: 10.7 10*3/uL — ABNORMAL HIGH (ref 1.7–7.7)
Neutrophils Relative %: 87 %

## 2021-10-07 LAB — CBC WITH DIFFERENTIAL/PLATELET
Abs Immature Granulocytes: 0.05 10*3/uL (ref 0.00–0.07)
Basophils Absolute: 0.1 10*3/uL (ref 0.0–0.1)
Basophils Relative: 0 %
Eosinophils Absolute: 0.5 10*3/uL (ref 0.0–0.5)
Eosinophils Relative: 5 %
HCT: 40.8 % (ref 36.0–46.0)
Hemoglobin: 13.5 g/dL (ref 12.0–15.0)
Immature Granulocytes: 0 %
Lymphocytes Relative: 6 %
Lymphs Abs: 0.8 10*3/uL (ref 0.7–4.0)
MCH: 34.3 pg — ABNORMAL HIGH (ref 26.0–34.0)
MCHC: 33.1 g/dL (ref 30.0–36.0)
MCV: 103.6 fL — ABNORMAL HIGH (ref 80.0–100.0)
Monocytes Absolute: 0.4 10*3/uL (ref 0.1–1.0)
Monocytes Relative: 3 %
Neutro Abs: 10.3 10*3/uL — ABNORMAL HIGH (ref 1.7–7.7)
Neutrophils Relative %: 86 %
Platelets: 263 10*3/uL (ref 150–400)
RBC: 3.94 MIL/uL (ref 3.87–5.11)
RDW: 12.9 % (ref 11.5–15.5)
WBC: 12 10*3/uL — ABNORMAL HIGH (ref 4.0–10.5)
nRBC: 0 % (ref 0.0–0.2)

## 2021-10-07 LAB — LIPID PANEL
Cholesterol: 193 mg/dL (ref 0–200)
HDL: 71 mg/dL (ref >40)
LDL Cholesterol: 108 mg/dL — ABNORMAL HIGH (ref 0–99)
Total CHOL/HDL Ratio: 2.7 RATIO
Triglycerides: 68 mg/dL (ref <150)
VLDL: 14 mg/dL (ref 0–40)

## 2021-10-07 LAB — CBC
HCT: 35.7 % — ABNORMAL LOW (ref 36.0–46.0)
Hemoglobin: 11.9 g/dL — ABNORMAL LOW (ref 12.0–15.0)
MCH: 33.9 pg (ref 26.0–34.0)
MCHC: 33.3 g/dL (ref 30.0–36.0)
MCV: 101.7 fL — ABNORMAL HIGH (ref 80.0–100.0)
Platelets: 305 10*3/uL (ref 150–400)
RBC: 3.51 MIL/uL — ABNORMAL LOW (ref 3.87–5.11)
RDW: 12.9 % (ref 11.5–15.5)
WBC: 12.2 10*3/uL — ABNORMAL HIGH (ref 4.0–10.5)
nRBC: 0 % (ref 0.0–0.2)

## 2021-10-07 LAB — IRON AND TIBC
Iron: 19 ug/dL — ABNORMAL LOW (ref 28–170)
Saturation Ratios: 6 % — ABNORMAL LOW (ref 10.4–31.8)
TIBC: 332 ug/dL (ref 250–450)
UIBC: 313 ug/dL

## 2021-10-07 LAB — ETHANOL: Alcohol, Ethyl (B): <10 mg/dL (ref <10)

## 2021-10-07 LAB — HEMOGLOBIN A1C
Hgb A1c MFr Bld: 5.5 % (ref 4.8–5.6)
Mean Plasma Glucose: 111.15 mg/dL

## 2021-10-07 LAB — PROTIME-INR
INR: 1.1 (ref 0.8–1.2)
Prothrombin Time: 14.3 seconds (ref 11.4–15.2)

## 2021-10-07 LAB — VITAMIN B12: Vitamin B-12: 159 pg/mL — ABNORMAL LOW (ref 180–914)

## 2021-10-07 LAB — FOLATE: Folate: 16.6 ng/mL (ref >5.9)

## 2021-10-07 LAB — APTT: aPTT: 29 seconds (ref 24–36)

## 2021-10-07 MED ORDER — POTASSIUM CHLORIDE 10 MEQ/100ML IV SOLN
10.0000 meq | Freq: Once | INTRAVENOUS | Status: AC
Start: 2021-10-07 — End: 2021-10-07
  Administered 2021-10-07: 10 meq via INTRAVENOUS
  Filled 2021-10-07: qty 100

## 2021-10-07 MED ORDER — ASPIRIN EC 81 MG PO TBEC
81.0000 mg | DELAYED_RELEASE_TABLET | Freq: Every day | ORAL | Status: DC
  Administered 2021-10-08 – 2021-10-15 (×8): 81 mg via ORAL
  Filled 2021-10-07 (×9): qty 1

## 2021-10-07 MED ORDER — LACTATED RINGERS IV BOLUS
1000.0000 mL | Freq: Once | INTRAVENOUS | Status: AC
  Administered 2021-10-07: 1000 mL via INTRAVENOUS

## 2021-10-07 MED ORDER — SODIUM CHLORIDE 0.9 % IV SOLN: 1.5000 g | Freq: Four times a day (QID) | INTRAVENOUS | Status: DC

## 2021-10-07 MED ORDER — ACETAMINOPHEN 160 MG/5ML PO SOLN
650.0000 mg | ORAL | Status: DC | PRN
  Filled 2021-10-07: qty 20.3

## 2021-10-07 MED ORDER — CLONAZEPAM 0.5 MG PO TABS
0.5000 mg | ORAL_TABLET | Freq: Two times a day (BID) | ORAL | Status: DC | PRN
  Administered 2021-10-07 – 2021-10-12 (×7): 0.5 mg via ORAL
  Filled 2021-10-07 (×7): qty 1

## 2021-10-07 MED ORDER — ACETAMINOPHEN 650 MG RE SUPP: 650.0000 mg | RECTAL | Status: DC | PRN

## 2021-10-07 MED ORDER — ACETAMINOPHEN 325 MG PO TABS
650.0000 mg | ORAL_TABLET | ORAL | Status: DC | PRN
  Administered 2021-10-07 – 2021-10-12 (×5): 650 mg via ORAL
  Filled 2021-10-07 (×5): qty 2

## 2021-10-07 MED ORDER — SODIUM CHLORIDE 0.9 % IV SOLN
3.0000 g | Freq: Two times a day (BID) | INTRAVENOUS | Status: DC
  Administered 2021-10-07 (×2): 3 g via INTRAVENOUS
  Filled 2021-10-07 (×3): qty 8

## 2021-10-07 MED ORDER — STROKE: EARLY STAGES OF RECOVERY BOOK: Freq: Once | Status: DC

## 2021-10-07 MED ORDER — ENOXAPARIN SODIUM 30 MG/0.3ML IJ SOSY
30.0000 mg | PREFILLED_SYRINGE | INTRAMUSCULAR | Status: DC
  Administered 2021-10-07: 30 mg via SUBCUTANEOUS
  Filled 2021-10-07: qty 0.3

## 2021-10-07 MED ORDER — POTASSIUM CHLORIDE CRYS ER 20 MEQ PO TBCR
40.0000 meq | EXTENDED_RELEASE_TABLET | Freq: Two times a day (BID) | ORAL | Status: AC
  Administered 2021-10-07 – 2021-10-08 (×4): 40 meq via ORAL
  Filled 2021-10-07 (×4): qty 2

## 2021-10-07 MED ORDER — ASPIRIN 81 MG PO CHEW
324.0000 mg | CHEWABLE_TABLET | Freq: Once | ORAL | Status: AC
  Administered 2021-10-07: 324 mg via ORAL
  Filled 2021-10-07: qty 4

## 2021-10-07 MED ORDER — SODIUM CHLORIDE 0.9 % IV SOLN: INTRAVENOUS | Status: AC

## 2021-10-07 MED ORDER — ATORVASTATIN CALCIUM 80 MG PO TABS
80.0000 mg | ORAL_TABLET | Freq: Every day | ORAL | Status: DC
  Administered 2021-10-07 – 2021-10-14 (×8): 80 mg via ORAL
  Filled 2021-10-07 (×4): qty 4
  Filled 2021-10-07: qty 1
  Filled 2021-10-07 (×2): qty 4
  Filled 2021-10-07 (×2): qty 1

## 2021-10-07 MED ORDER — DONEPEZIL HCL 5 MG PO TABS
10.0000 mg | ORAL_TABLET | Freq: Every day | ORAL | Status: DC
  Administered 2021-10-07 – 2021-10-14 (×8): 10 mg via ORAL
  Filled 2021-10-07 (×8): qty 2

## 2021-10-07 MED ORDER — CLOPIDOGREL BISULFATE 75 MG PO TABS
75.0000 mg | ORAL_TABLET | Freq: Every day | ORAL | Status: DC
  Administered 2021-10-07 – 2021-10-15 (×9): 75 mg via ORAL
  Filled 2021-10-07 (×9): qty 1

## 2021-10-07 NOTE — ED Notes (Signed)
Patient with coughing after tylenol administration. Placed on NPO status for speech therapy evaluation of swallowing.  ?

## 2021-10-07 NOTE — TOC Initial Note (Signed)
Transition of Care (TOC) - Initial/Assessment Note  ? ? ?Patient Details  ?Name: Gabriella Rogers ?MRN: [id number] ?Date of Birth: 1933-10-30 ? ?Transition of Care (TOC) CM/SW Contact:    ?Shelbie Hutching, RN ?Phone Number: ?10/07/2021, 9:34 AM ? ?Clinical Narrative:                 ?Patient placed under observation for CVA.  Patient came in from home where she lives with her husband.  RNCM met with patient and patient's daughter at the bedside, introduced self and explained role in discharge planning.  Daughter is Rogers, she reports that patient is independent at home, can bath and dress herself, she has a cane and walker but typically does not use them and wall surfs instead.   ?Speech has cleared her for a diet, PT and OT to eval.  Patient has been to [facility] in the past for rehab after a stroke 2 years ago.  They may be open to SNF or HH again if recommended.    ?TOC will follow. ? ?Expected Discharge Plan: [place] ?Barriers to Discharge: Continued Medical Work up ? ? ?Patient Goals and CMS Choice ?Patient states their goals for this hospitalization and ongoing recovery are:: to get back home, may be agreeable to SNF or HH ?CMS Medicare.gov Compare Post Acute Care list provided to:: Patient Represenative (must comment) ?Choice offered to / list presented to : Adult Children, Spouse ? ?Expected Discharge Plan and Services ?Expected Discharge Plan: [place] ?  ?Discharge Planning Services: CM Consult ?  ?Living arrangements for the past 2 months: [place] ?                ?DME Arranged: N/A ?DME Agency: NA ?  ?  ?  ?  ?  ?  ?  ?  ? ?Prior Living Arrangements/Services ?Living arrangements for the past 2 months: [place] ?Lives with:: Spouse ?Patient language and need for interpreter reviewed:: Yes ?Do you feel safe going back to the place where you live?: Yes      ?Need for Family Participation in Patient Care: Yes (Comment) ?Care giver support system in place?: Yes  (comment) ?Current home services: DME (cane and walker) ?Criminal Activity/Legal Involvement Pertinent to Current Situation/Hospitalization: No - Comment as needed ? ?Activities of Daily Living ?  ?  ? ?Permission Sought/Granted ?Permission sought to share information with : Case Manager, Family Supports ?Permission granted to share information with : Yes, Verbal Permission Granted ? Share Information with NAME: Gabriella Rogers ?   ? Permission granted to share info w Relationship: daughter ? Permission granted to share info w Contact Information: [phone] ? ?Emotional Assessment ?Appearance:: Appears stated age ?Attitude/Demeanor/Rapport: Engaged ?Affect (typically observed): Accepting ?Orientation: : Oriented to Self, Oriented to Place ?Alcohol / Substance Use: Not Applicable ?Psych Involvement: No (comment) ? ?Admission diagnosis:  Acute CVA (cerebrovascular accident) ([place]) [I63.9] ?Patient Active Problem List  ? Diagnosis Date Noted  ? AKI (acute kidney injury) ([place]) 10/07/2021  ? Hypokalemia 10/07/2021  ? Macrocytic anemia 10/07/2021  ? Leukocytosis 10/07/2021  ? Generalized weakness 10/07/2021  ? Urinary tract infection without hematuria 04/13/2021  ? Acute cystitis without hematuria 04/13/2021  ? Urethritis 04/13/2021  ? Painful urination 01/07/2021  ? Wax in ear 02/26/2020  ? Acute CVA (cerebrovascular accident) ([place]) 01/26/2020  ? Mild cognitive impairment 01/26/2020  ? Depression 11/26/2015  ? Dizziness 11/26/2015  ? Hyperlipidemia, unspecified 11/26/2015  ? Headache, migraine 11/26/2015  ?  Osteoporosis, post-menopausal 11/26/2015  ? H/O total knee replacement 11/23/2015  ? Arthropathy, traumatic, knee 03/05/2015  ? Brain disorder 03/27/2014  ? Cervical dystonia 01/24/2012  ? ?PCP:  Cletis Athens, MD ?Pharmacy:   ?[facility] [place] - [address] ?[address] ?[place] [address] ?Phone: [phone] Fax: [phone] ? ? ? ? ?Social Determinants of Health (SDOH)  Interventions ?  ? ?Readmission Risk Interventions ?   ? View : No data to display.  ?  ?  ?  ? ? ? ?

## 2021-10-07 NOTE — Assessment & Plan Note (Signed)
IV hydration and monitor 

## 2021-10-07 NOTE — Progress Notes (Signed)
PHARMACIST - PHYSICIAN COMMUNICATION ? ?CONCERNING:  Enoxaparin (Lovenox) for DVT Prophylaxis  ? ? ?RECOMMENDATION: ?Patient was prescribed enoxaprin 40mg  q24 hours for VTE prophylaxis.  ? ?Filed Weights  ? 10/06/21 2335  ?Weight: 54.5 kg (120 lb 2.4 oz)  ? ? ?Body mass index is 21.28 kg/m?. ? ?Estimated Creatinine Clearance: 27.6 mL/min (A) (by C-G formula based on SCr of 1.21 mg/dL (H)). ? ?Patient is candidate for enoxaparin 30mg  every 24 hours based on CrCl <72ml/min or Weight <45kg ? ?DESCRIPTION: ?Pharmacy has adjusted enoxaparin dose per [facility] policy. ? ?Patient is now receiving enoxaparin 30 mg every 24 hours  ? ?31m, PharmD, MBA ?10/07/2021 ?3:12 AM ? ?

## 2021-10-07 NOTE — Progress Notes (Signed)
88 year old with history of mild cognitive impairment from home, prior stroke and 2021 on aspirin currently, hyperlipidemia brought to ER with weakness and difficulty getting her words out.  In the emergency room hemodynamically stable.  No focal neurological deficit.  Patient was found to have a small acute ischemic stroke right thalamus and right central semiovale.  Remains slightly impulsive and confused.  Overnight temperature 103. ?Seen and examined patient.  Pleasantly confused.  Denies any complaints.  Not sure why she is in the hospital but she knows she has a stroke. ? ?Febrile episode: Temperature 103 with tachycardia. ?Chest x-ray with no obvious pneumonia, COVID-19 and flu negative.  Urinalysis fairly normal.  Upper airway sounds noted after patient was given medications.  Currently NPO. ?Suspect aspiration pneumonitis, lactic acid and procalcitonin normal.  Given high temperature, blood cultures were drawn and patient will be started on Unasyn for at least next 24 hours to cover for aspiration pneumonitis/aspiration pneumonia.  Speech therapy to follow. ? ?Acute ischemic stroke: ?Admit to monitored unit because of severity of symptoms. ?Neurochecks and vital signs as per stroke protocol. ?Patient was not a TPA and vascular intervention candidate because of very minor symptoms. ?Diet, n.p.o. until seen by speech ?Antiplatelets, on aspirin at home.  Loaded with aspirin and Plavix.  Neurology to determine. ?Statin, LDL pending. ?Blood pressure goals, normotensive ?Consultations, neurology, speech, PT OT ?MRI of the brain as above. ?Her creatinine clearance is 27, will order carotid ultrasound instead of CT angiogram. ?2D echocardiogram, pending ? ?Patient is to stay in the hospital to treat for her fever, investigation of the stroke. ? ? ?Total time spent: 30 minutes.  Same-day visit.  No charge visit. ?

## 2021-10-07 NOTE — Plan of Care (Signed)

## 2021-10-07 NOTE — Assessment & Plan Note (Addendum)
History of prior stroke in 2021 currently on aspirin ?Permissive hypertension for first 24-48 hrs post stroke onset: Prn Labetalol IV or Vasotec IV If BP greater than 220/120  ?Statins for LDL goal less than 70 ?ASA 81mg  daily, Plavix 75mg  daily x 3 weeks then monotherapy with Plavix thereafter ?Telemetry, echocardiogram ?Avoid dextrose containing fluids, Maintain euglycemia, euthermia ?Neuro checks q4 hrs x 24 hrs and then per shift ?Head of bed 30 degrees ?Physical therapy/Occupational therapy/Speech therapy if failed dysphagia screen ?Neurology consult to follow ? ?

## 2021-10-07 NOTE — Evaluation (Signed)
Occupational Therapy Evaluation ?Patient Details ?Name: Gabriella Rogers ?MRN: [id number] ?DOB: [birth year] ?Today's Date: 10/07/2021 ? ? ?History of Present Illness Pt is an 88 year old female admitted with small acute ischemic stroke right thalamus and right central semiovale. PMH significant for mild cognitive impairment from home, prior stroke and 2021 on aspirin currently, hyperlipidemia brought to ER with weakness and difficulty getting her words out  ? ?Clinical Impression ?  ?Chart reviewed to date, RN cleared pt for participation in OT evaluation. Daughter and husband present at bedside throughout evaluation. PTA pt is MOD I for mobility and ADL completion with intermittent assist, husband assisted with all IADLs.At this time pt requires MOD A for bed mobility, STS with MOD A, amb approx 2 steps with MOD A before pt reporting fatigue and sitting down. Per discussion with family, pt is performing below PLOF cognitively as well. Full BUE sensory assessment, vision assessment will be completed at a later time as appropriate as pt with decreased tolerance for evaluation on this date. At this time recommend discharge to STR to address functional deficits. OT will continue to follow while admitted.  ?   ? ?Recommendations for follow up therapy are one component of a multi-disciplinary discharge planning process, led by the attending physician.  Recommendations may be updated based on patient status, additional functional criteria and insurance authorization.  ? ?Follow Up Recommendations ? Skilled nursing-short term rehab (<3 hours/day)  ?  ?Assistance Recommended at Discharge Frequent or constant Supervision/Assistance  ?Patient can return home with the following A lot of help with walking and/or transfers;A lot of help with bathing/dressing/bathroom;Assistance with cooking/housework;Help with stairs or ramp for entrance;Direct supervision/assist for medications management;Assist for transportation ? ?  ?Functional  Status Assessment ? Patient has had a recent decline in their functional status and demonstrates the ability to make significant improvements in function in a reasonable and predictable amount of time.  ?Equipment Recommendations ? None recommended by OT;Other (comment) (per next venue of care)  ?  ?Recommendations for Other Services   ? ? ?  ?Precautions / Restrictions Precautions ?Precautions: Fall ?Restrictions ?Weight Bearing Restrictions: No  ? ?  ? ?Mobility Bed Mobility ?Overal bed mobility: Needs Assistance ?Bed Mobility: Supine to Sit, Sit to Supine ?  ?  ?Supine to sit: Mod assist, HOB elevated ?Sit to supine: Mod assist, HOB elevated ?  ?  ?  ? ?Transfers ?Overall transfer level: Needs assistance ?Equipment used: Rolling walker (2 wheels) ?Transfers: Sit to/from Stand ?Sit to Stand: Mod assist ?  ?  ?  ?  ?  ?General transfer comment: vcs for technique, pt with posterior lean ?  ? ?  ?Balance Overall balance assessment: Needs assistance ?Sitting-balance support: Bilateral upper extremity supported ?Sitting balance-Leahy Scale: Fair ?  ?Postural control: Left lateral lean, Posterior lean ?Standing balance support: Reliant on assistive device for balance, During functional activity ?Standing balance-Leahy Scale: Poor ?  ?  ?  ?  ?  ?  ?  ?  ?  ?  ?  ?  ?   ? ?ADL either performed or assessed with clinical judgement  ? ?ADL Overall ADL's : Needs assistance/impaired ?  ?  ?Grooming: Wash/dry hands;Wash/dry face;Oral care;Moderate assistance;Sitting ?Grooming Details (indicate cue type and reason): anticipated ?  ?  ?  ?  ?Upper Body Dressing : Maximal assistance;Bed level ?  ?Lower Body Dressing: Maximal assistance;Bed level ?  ?  ?  ?Toileting- Clothing Manipulation and Hygiene: Total assistance ?  ?  ?  ?  Functional mobility during ADLs: Moderate assistance;Rolling walker (2 wheels) ?   ? ? ? ?Vision Patient Visual Report: No change from baseline ?Additional Comments: will continue to assess, pt unable to  participate in formal vision testing on this date  ?   ?Perception   ?  ?Praxis   ?  ? ?Pertinent Vitals/Pain Pain Assessment ?Pain Assessment: Faces ?Faces Pain Scale: Hurts a little bit ?Pain Location: generalized ?Pain Descriptors / Indicators: Grimacing ?Pain Intervention(s): Limited activity within patient's tolerance, Monitored during session, Repositioned  ? ? ? ?Hand Dominance Left ?  ?Extremity/Trunk Assessment Upper Extremity Assessment ?Upper Extremity Assessment: Generalized weakness;RUE deficits/detail ?RUE Deficits / Details: will continue to assess ?RUE Coordination: decreased fine motor;decreased gross motor ?  ?Lower Extremity Assessment ?Lower Extremity Assessment: Generalized weakness; ?  ?  ?  ?Communication Communication ?Communication: No difficulties ?  ?Cognition Arousal/Alertness: Awake/alert ?Behavior During Therapy: Flat affect ?Overall Cognitive Status: Impaired/Different from baseline ?Area of Impairment: Orientation, Attention, Memory, Following commands ?  ?  ?  ?  ?  ?  ?  ?  ?Orientation Level: Disoriented to, Situation, Time ?Current Attention Level: Sustained ?Memory: Decreased recall of precautions, Decreased short-term memory ?Following Commands: Follows one step commands consistently, Follows multi-step commands inconsistently ?  ?  ?  ?General Comments: pt has baseline dementia; ?  ?  ?General Comments   ? ?  ?Exercises Other Exercises ?Other Exercises: edu to pt and family re: role of OT, role of rehab, discharge recommendations ?  ?Shoulder Instructions    ? ? ?Home Living Family/patient expects to be discharged to:: Private residence ?Living Arrangements: Spouse/significant other ?Available Help at Discharge: Family;Available 24 hours/day ?Type of Home: House ?Home Access: Stairs to enter ?Entrance Stairs-Number of Steps: 1 ?Entrance Stairs-Rails: None ?Home Layout: One level ?  ?  ?Bathroom Shower/Tub: Tub/shower unit ?  ?Bathroom Toilet: Standard ?  ?  ?Home Equipment:  Grab bars - tub/shower;Rolling Walker (2 wheels);Cane - single point ?  ?  ?  ? ?  ?Prior Functioning/Environment Prior Level of Function : Independent/Modified Independent ?  ?  ?  ?  ?  ?  ?Mobility Comments: MOD I with amb household distances with increased time, reaching outwardly for wall support per daugther and husband report; has access to a cane/walker ?ADLs Comments: MOD I- I for ADLs, takes sink bath; husband assists with IADLS (cooking, cleaning, driving) ?  ? ?  ?  ?OT Problem List: Decreased strength;Decreased activity tolerance;Impaired balance (sitting and/or standing);Decreased cognition ?  ?   ?OT Treatment/Interventions: Self-care/ADL training;Therapeutic exercise;Patient/family education;Neuromuscular education;Balance training;Therapeutic activities;DME and/or AE instruction;Cognitive remediation/compensation  ?  ?OT Goals(Current goals can be found in the care plan section) Acute Rehab OT Goals ?Patient Stated Goal: feel better ?OT Goal Formulation: With patient/family ?Time For Goal Achievement: 10/21/21 ?Potential to Achieve Goals: Good  ?OT Frequency: Min 3X/week ?  ? ?Co-evaluation   ?  ?  ?  ?  ? ?  ?AM-PAC OT "6 Clicks" Daily Activity     ?Outcome Measure Help from another person eating meals?: A Little ?Help from another person taking care of personal grooming?: A Lot ?Help from another person toileting, which includes using toliet, bedpan, or urinal?: Total ?Help from another person bathing (including washing, rinsing, drying)?: A Lot ?Help from another person to put on and taking off regular upper body clothing?: A Lot ?Help from another person to put on and taking off regular lower body clothing?: A Lot ?6 Click Score: 12 ?  ?  End of Session Equipment Utilized During Treatment: Gait belt;Rolling walker (2 wheels) ?Nurse Communication: Mobility status ? ?Activity Tolerance: Patient tolerated treatment well;Patient limited by fatigue ?Patient left: in bed;with call bell/phone within  reach;with family/visitor present ? ?OT Visit Diagnosis: Other symptoms and signs involving the nervous system (R29.898);Muscle weakness (generalized) (M62.81);Unsteadiness on feet (R26.81)  ?              ?Time

## 2021-10-07 NOTE — Assessment & Plan Note (Signed)
Follow anemia profile ?

## 2021-10-07 NOTE — ED Notes (Signed)
Gabriella Rogers notified via secure chat of oral temperature of 102.9, increased tremors and that patient now disoriented to place.  ?

## 2021-10-07 NOTE — Assessment & Plan Note (Signed)
Suspect related to acute CVA ?Checking for possible infection ?PT evaluation ?

## 2021-10-07 NOTE — H&P (Signed)
?History and Physical  ? ? ?Patient: Gabriella Rogers [id number] DOB: 1934-05-18 ?DOA: 10/06/2021 ?DOS: the patient was seen and examined on 10/07/2021 ?PCP: Corky Downs, MD  ?Patient coming from: Home ? ?Chief Complaint:  ?Chief Complaint  ?Patient presents with  ? Weakness  ? Aphasia  ? Facial Droop  ? ? ?HPI: Gabriella Rogers is a 88 y.o. female with medical history significant for Mild cognitive impairment, depression cervical dystonia on Botox shots, prior stroke in August 2021 on DAPT for 3 months but currently on aspirin , hyperlipidemia who was brought to the ED with a concern for weakness and difficulty getting her words out.  Last known normal was 24 hours prior when patient went to bed.  She awoke on the day of arrival on 4/19 feeling weak and spent most of the day in the recliner.  When his symptoms did not improve her husband called 911 to bring her to the emergency room.  She has not been otherwise unwell and reports no cough, shortness of breath, fever or chills, nausea, vomiting diarrhea or abdominal pain.  Has no headache or visual disturbance. ?ED course and data review: Vitals within normal limits.  Labs with WBC 12,000, hemoglobin 11.9, baseline 12-13, with MCV of 101.  Potassium 3, creatinine 1.21 up from baseline of 0.83, urinalysis unremarkable, UDS positive for benzos, COVID and flu negative.  EKG, personally viewed and interpreted with sinus rhythm of 71 with no acute ST-T wave changes.  MRI brain showing the following: ?IMPRESSION: 1. Small foci of acute ischemia within the ventral right thalamus and right centrum semiovale. No hemorrhage or mass effect. 2. Moderate chronic small vessel ischemic disease and generalized volume loss.  ? ?Patient given a chewable aspirin, IV potassium and an IV fluid bolus.  Hospitalist consulted for admission. ?  ? ?Past Medical History:  ?Diagnosis Date  ? Brain disorder 03/27/2014  ? Cervical dystonia 01/24/2012  ? Clinical depression 11/26/2015  ? Dizziness  11/26/2015  ? Overview:  Chronic, felt secondary to inner ear per ENT evaluation approximately 1996.   ? H/O total knee replacement 11/23/2015  ? Headache, migraine 11/26/2015  ? HLD (hyperlipidemia) 11/26/2015  ? Hypercholesteremia   ? Osteoporosis, post-menopausal 11/26/2015  ? ?Past Surgical History:  ?Procedure Laterality Date  ? ABDOMINAL HYSTERECTOMY    ? ANKLE SURGERY Left   ? KNEE SURGERY Right   ? ?Social History:  reports that she has never smoked. She has never used smokeless tobacco. She reports that she does not drink alcohol. No history on file for drug use. ? ?Allergies  ?Allergen Reactions  ? Actonel  [Risedronate Sodium]   ? Piper   ? Pneumococcal Vaccine Other (See Comments)  ?  WEAKNESS, DISORIENTED, "ARM SORE, RED, HURTING"  ? Risedronate Other (See Comments)  ?  REDNESS IN FACE  ? Alendronate Rash  ? Aleve [Naproxen Sodium] Rash  ? Pravastatin Rash  ? ? ?Family History  ?Problem Relation Age of Onset  ? Stroke Maternal Grandfather   ? Stroke Paternal Grandfather   ? Kidney cancer Neg Hx   ? Hematuria Neg Hx   ? ? ?Prior to Admission medications   ?Medication Sig Start Date End Date Taking? Authorizing Provider  ?acetaminophen (TYLENOL) 325 MG tablet Take 650 mg by mouth every 6 (six) hours as needed.    [provider]  ?aspirin 325 MG tablet Take 1 tablet (325 mg total) by mouth daily. 02/02/20   Swayze, Ava, DO  ?atorvastatin (LIPITOR) 80 MG  tablet Take 1 tablet (80 mg total) by mouth daily at 6 PM. 08/25/20   Corky Downs, MD  ?clonazePAM (KLONOPIN) 0.5 MG tablet Take 0.5 mg by mouth 2 (two) times daily as needed.     [provider]  ?clopidogrel (PLAVIX) 75 MG tablet Take 1 tablet (75 mg total) by mouth daily. 09/29/21   Corky Downs, MD  ?donepezil (ARICEPT) 10 MG tablet Take 1 tablet (10 mg total) by mouth at bedtime. 08/25/20   Corky Downs, MD  ?Multiple Vitamins-Minerals (ONE-A-DAY WOMENS PO) Take 1 tablet by mouth daily.    [provider]  ?nitrofurantoin (MACRODANTIN)  100 MG capsule Take 1 capsule (100 mg total) by mouth 4 (four) times daily. 08/11/21   Corky Downs, MD  ? ? ?Physical Exam: ?Vitals:  ? 10/06/21 2326 10/06/21 2333 10/06/21 2335 10/07/21 0100  ?BP:  130/64  135/62  ?Pulse:  72  69  ?Resp:  16  18  ?Temp:  98.5 ?F (36.9 ?C)    ?TempSrc:  Oral    ?SpO2: 96% 97%  98%  ?Weight:   54.5 kg   ? ?Physical Exam ?Vitals and nursing note reviewed.  ?Constitutional:   ?   General: She is not in acute distress. ?   Comments: [place] elderly female  ?HENT:  ?   Head: Normocephalic and atraumatic.  ?Cardiovascular:  ?   Rate and Rhythm: Normal rate and regular rhythm.  ?   Heart sounds: Normal heart sounds.  ?Pulmonary:  ?   Effort: Pulmonary effort is normal.  ?   Breath sounds: Normal breath sounds.  ?Abdominal:  ?   Palpations: Abdomen is soft.  ?   Tenderness: There is no abdominal tenderness.  ?Neurological:  ?   Mental Status: She is disoriented.  ? ? ? ?Data Reviewed: ?Relevant notes from primary care and specialist visits, past discharge summaries as available in EHR, including Care Everywhere. ?Prior diagnostic testing as pertinent to current admission diagnoses ?Updated medications and problem lists for reconciliation ?ED course, including vitals, labs, imaging, treatment and response to treatment ?Triage notes, nursing and pharmacy notes and ED provider's notes ?Notable results as noted in HPI ? ? ?Assessment and Plan: ?* Acute CVA (cerebrovascular accident) (HCC) ?Permissive hypertension for first 24-48 hrs post stroke onset: Prn Labetalol IV or Vasotec IV If BP greater than 220/120  ?Statins for LDL goal less than 70 ?ASA 81mg  daily, Plavix 75mg  daily x 3 weeks then monotherapy thereafter ?Telemetry, echocardiogram ?Avoid dextrose containing fluids, Maintain euglycemia, euthermia ?Neuro checks q4 hrs x 24 hrs and then per shift ?Head of bed 30 degrees ?Physical therapy/Occupational therapy/Speech therapy if failed dysphagia screen ?Neurology consult to  follow ? ? ?Hypokalemia ?Received IV repletion in the ER.  Continue to monitor ? ?AKI (acute kidney injury) (HCC) ?IV hydration and monitor ? ?Leukocytosis ?No other stigmata of infection.  Urinalysis unremarkable ?Follow chest x-ray ? ?Macrocytic anemia ?Follow anemia profile ? ?Generalized weakness ?Suspect related to acute CVA ?Checking for possible infection ?PT evaluation ? ?Mild cognitive impairment ?Continue donepezil ? ?Cervical dystonia ?Receives Botox by [facility] neurology ?Continue clonazepam as needed ? ? ? ? ? ? ?Advance Care Planning:   Code Status: Prior  ? ?Consults: Neurologist, Dr. ? ?Family Communication: none ? ?Severity of Illness: ?The appropriate patient status for this patient is OBSERVATION. Observation status is judged to be reasonable and necessary in order to provide the required intensity of service to ensure the patient's safety. The patient's presenting symptoms, physical  exam findings, and initial radiographic and laboratory data in the context of their medical condition is felt to place them at decreased risk for further clinical deterioration. Furthermore, it is anticipated that the patient will be medically stable for discharge from the hospital within 2 midnights of admission.  ? ?Author: ?Andris BaumannHazel V Quetzal Meany, MD ?10/07/2021 3:00 AM ? ?For on call review [email or web address].  ?

## 2021-10-07 NOTE — ED Notes (Signed)
Dr. Ghimire at bedside 

## 2021-10-07 NOTE — ED Notes (Signed)
Pt resting comfortably at this time. Pt denies any needs at this time. NAD noted. Call bell in reach.  ? ?Lactic acid and procalcitonin sent to lab.  ?

## 2021-10-07 NOTE — Assessment & Plan Note (Signed)
Received IV repletion in the ER.  Continue to monitor ?

## 2021-10-07 NOTE — ED Notes (Signed)
Report to [employer].  ?

## 2021-10-07 NOTE — Assessment & Plan Note (Signed)
No other stigmata of infection.  Urinalysis unremarkable ?Follow chest x-ray ?

## 2021-10-07 NOTE — ED Notes (Signed)
OT at bedside. 

## 2021-10-07 NOTE — Evaluation (Addendum)
Clinical/Bedside Swallow Evaluation ?Patient Details  ?Name: Gabriella Rogers ?MRN: [id number] ?Date of Birth: October 05, 1933 ? ?Today's Date: 10/07/2021 ?Time: SLP Start Time (ACUTE ONLY): 0910 SLP Stop Time (ACUTE ONLY): 0950 ?SLP Time Calculation (min) (ACUTE ONLY): 40 min ? ?Past Medical History:  ?Past Medical History:  ?Diagnosis Date  ? Brain disorder 03/27/2014  ? Cervical dystonia 01/24/2012  ? Clinical depression 11/26/2015  ? Dizziness 11/26/2015  ? Overview:  Chronic, felt secondary to inner ear per ENT evaluation approximately 1996.   ? H/O total knee replacement 11/23/2015  ? Headache, migraine 11/26/2015  ? HLD (hyperlipidemia) 11/26/2015  ? Hypercholesteremia   ? Osteoporosis, post-menopausal 11/26/2015  ? ?Past Surgical History:  ?Past Surgical History:  ?Procedure Laterality Date  ? ABDOMINAL HYSTERECTOMY    ? ANKLE SURGERY Left   ? KNEE SURGERY Right   ? ?HPI:  ?Pt is a 88 y.o. female with medical history significant for mild Cognitive Impairment/decline, brain disorder per chart, depression cervical dystonia on Botox shots, prior stroke in August 2021 on DAPT for 3 months but currently on aspirin,depression, hyperlipidemia who was brought to the ED with a concern for weakness and difficulty getting her words out.  ?  ?Assessment / Plan / Recommendation  ?Clinical Impression ? Pt seen for BSE this morning. She was verbal, alert, and engaged w/ others easily. She was A/O x2(self, place-hospital) but not to time. Noted Baseline Cognitive Impairment per chart notes/assessment completed in 2021(see chart note). Pt on RA.  ? ?Pt appears to present w/ adequate oropharyngeal phase swallow function w/ No oropharyngeal phase dysphagia noted, No neuromuscular deficits noted. Pt consumed po trials w/ No overt, clinical s/s of aspiration during po trials. Pt appears at reduced risk for aspiration following general aspiration precautions and when assisted w/ setup and positioning for self-feeding -- reduce Distractions during oral  intake(in setting of baseline Cognitive decline).  ? ?OF NOTE: pt does have a Hiatal Hernia per CXR; recommend strict REFLUX PRECAUTIONS d/t this as any Esophageal phase dysmotility can impact swallowing and oral intake in general.  ? ?During po trials, pt consumed all consistencies w/ no overt coughing, decline in vocal quality, or change in respiratory presentation during/post trials. O2 sats remained 98+%. Oral phase appeared [facility] w/ timely bolus management, mastication, and control of bolus propulsion for A-P transfer for swallowing. Oral clearing achieved w/ all trial consistencies. OM Exam appeared [facility] w/ no unilateral weakness noted. Speech intelligible, clear. Pt fed self w/ setup support.  ? ?Recommend a Regular consistency diet w/ well-Cut meats, moistened foods(will be monitored by Daughter); Thin liquids VIA CUP - pt does not often use straws at home. Recommend general aspiration precautions, Pills WHOLE in Puree for safer, easier swallowing if any difficulty swallowing w/ liquids -- NSG to demonstrate w/ pt/Dtr. Education given on Pills in Puree; food consistencies and easy to eat options; general aspiration precautions. Menu provided. MD to reconsult if any new needs arise. NSG and Dtr agreed. ? ?ADDENDUM: recommend Supervision at meals for support and follow through w/ general precautions d/t Baseline Cognitive decline; also f/u w/ PCP post discharge if concern for any decline re: pt's communication abilities from her Baseline (in setting of Cognitive decline) post return home and recovery from Acute illness.  ?SLP Visit Diagnosis: Dysphagia, unspecified (R13.10) ?   ?Aspiration Risk ?  (reduced following general aspiration precautions)  ?  ?Diet Recommendation   Regular consistency diet w/ well-Cut meats, moistened foods(will be monitored by Daughter); Thin liquids VIA CUP -  pt does not often use straws at home. Recommend general aspiration precautions including Reducing Distractions and Talking  during oral intake/meals. Positioning Upright for oral intake. ? ?Medication Administration: Whole meds with puree (if any difficulty swallowing w/ liquids)  ?  ?Other  Recommendations Recommended Consults:  (Dietician f/u) ?Oral Care Recommendations: Oral care BID;Oral care before and after PO;Patient independent with oral care (setup support) ?Other Recommendations:  (n/a)   ? ?Recommendations for follow up therapy are one component of a multi-disciplinary discharge planning process, led by the attending physician.  Recommendations may be updated based on patient status, additional functional criteria and insurance authorization. ? ?Follow up Recommendations No SLP follow up  ? ? ?  ?Assistance Recommended at Discharge Intermittent Supervision/Assistance (d/t baseline Cognitive decline)  ?Functional Status Assessment Patient has not had a recent decline in their functional status (re: swallowing)  ?Frequency and Duration  (n/a)  ? (n/a) ?  ?   ? ?Prognosis Prognosis for Safe Diet Advancement: Good ?Barriers to Reach Goals: Cognitive deficits;Language deficits;Time post onset;Severity of deficits (baseline Cognitive decline)  ? ?  ? ?Swallow Study   ?General Date of Onset: 10/06/21 ?HPI: Pt is a 88 y.o. female with medical history significant for Mild cognitive impairment, brain disorder per chart, depression cervical dystonia on Botox shots, prior stroke in August 2021 on DAPT for 3 months but currently on aspirin,depression, hyperlipidemia who was brought to the ED with a concern for weakness and difficulty getting her words out. ?Type of Study: Bedside Swallow Evaluation ?Previous Swallow Assessment: none; cognitive-linguistic assessment during admit in 2021 revealing cognitive-linguistic impairment of speech-language skills. F/u at SNF recommended then. ?Diet Prior to this Study: NPO (Regular diet at home) ?Temperature Spikes Noted: No (wbc slightly elevated; hemoglobin low, see labs) ?Respiratory Status: Room  air ?History of Recent Intubation: No ?Behavior/Cognition: Alert;Cooperative;Pleasant mood;Confused;Distractible;Requires cueing ?Oral Cavity Assessment: Within Functional Limits ?Oral Care Completed by SLP: Yes ?Oral Cavity - Dentition: Adequate natural dentition ?Vision: Functional for self-feeding ?Self-Feeding Abilities: Able to feed self;Needs set up;Needs assist ?Patient Positioning: Upright in bed (needed positioning support) ?Baseline Vocal Quality: Normal ?Volitional Cough: Strong (min congested) ?Volitional Swallow: Able to elicit  ?  ?Oral/Motor/Sensory Function Overall Oral Motor/Sensory Function: Within functional limits   ?Ice Chips Ice chips: Within functional limits ?Presentation: Spoon (fed; 3 trials)   ?Thin Liquid Thin Liquid: Within functional limits ?Presentation: Cup;Self Fed (10+ trials)  ?  ?Nectar Thick Nectar Thick Liquid: Not tested   ?Honey Thick Honey Thick Liquid: Not tested   ?Puree Puree: Within functional limits ?Presentation: Self Fed;Spoon (6+ trials)   ?Solid ? ? ?  Solid: Within functional limits ?Presentation: Self Fed (5+ trials of graham crackers) ?Other Comments: moistened  ? ?  ? ? ? ?Jerilynn Som, MS, CCC-SLP ?Speech Language Pathologist ?Rehab Services; [facility] [place] ?[phone] (ascom) ? ?Lilyonna Steidle ?10/07/2021,1:56 PM ? ? ? ?

## 2021-10-07 NOTE — ED Notes (Signed)
SLP at bedside.

## 2021-10-07 NOTE — Consult Note (Addendum)
?                    NEURO HOSPITALIST CONSULT NOTE  ? ?Requestig physician: Dr. Jerral Ralph ? ?Reason for Consult: Acute right thalamus and right centrum semiovale strokes seen on MRI ? ?History obtained from:  Chart    ? ?HPI:                                                                                                                                         ? Gabriella Rogers is an 88 y.o. female with a PMHx of mild cognitive impairment, prior stroke in August 2021 initially on DAPT for 3 months but currently on aspirin, cervical dystonia, depression, migraine headaches, HLD, hypercholesterolemia and osteoporosis who presented to the [facility] ED late Tuesday night for evaluation of new left facial droop, slurred speech and generalized weakness. LKN was 24 hours previously when husband and wife went to bed. The patient was not feeling well on Tuesday morning and decided to sit in her recliner. She did not get up from the recliner during the remainder of the day and was also noted to be having difficulty expressing herself verbally. She endorsed to husband that she felt very weak. As she did not get better by Tuesday evening, husband decided to bring her to the ED. She had not been having any fever or coughing at home. She was not short of breath. She denied headache or chest pain. No N/V or diarrhea. WBC of 12K was noted in the ED. Her urinalysis was unremarkable. Covid and flu tests were negative.  ? ?MRI brain revealed small foci of acute ischemia within the ventral right thalamus and right centrum semiovale. Moderate chronic small vessel ischemic disease and generalized volume loss were also noted.  ? ?Past Medical History:  ?Diagnosis Date  ? Brain disorder 03/27/2014  ? Cervical dystonia 01/24/2012  ? Clinical depression 11/26/2015  ? Dizziness 11/26/2015  ? Overview:  Chronic, felt secondary to inner ear per ENT evaluation approximately 1996.   ? H/O total knee replacement 11/23/2015  ? Headache, migraine 11/26/2015  ? HLD  (hyperlipidemia) 11/26/2015  ? Hypercholesteremia   ? Osteoporosis, post-menopausal 11/26/2015  ? ? ?Past Surgical History:  ?Procedure Laterality Date  ? ABDOMINAL HYSTERECTOMY    ? ANKLE SURGERY Left   ? KNEE SURGERY Right   ? ? ?Family History  ?Problem Relation Age of Onset  ? Stroke Maternal Grandfather   ? Stroke Paternal Grandfather   ? Kidney cancer Neg Hx   ? Hematuria Neg Hx   ?          ? ?Social History:  reports that she has never smoked. She has never used smokeless tobacco. She reports that she does not drink alcohol. No history on file for drug use. ? ?Allergies  ?Allergen Reactions  ? Actonel  [Risedronate Sodium]   ? Piper   ?  Pneumococcal Vaccine Other (See Comments)  ?  WEAKNESS, DISORIENTED, "ARM SORE, RED, HURTING"  ? Risedronate Other (See Comments)  ?  REDNESS IN FACE  ? Alendronate Rash  ? Aleve [Naproxen Sodium] Rash  ? Pravastatin Rash  ? ? ?MEDICATIONS:                                                                                                                     ?Prior to Admission:  ?Medications Prior to Admission  ?Medication Sig Dispense Refill Last Dose  ? acetaminophen (TYLENOL) 325 MG tablet Take 650 mg by mouth every 6 (six) hours as needed.   prn at prn  ? aspirin 325 MG tablet Take 1 tablet (325 mg total) by mouth daily. 30 tablet 0 10/06/2021 at unknown  ? atorvastatin (LIPITOR) 80 MG tablet Take 1 tablet (80 mg total) by mouth daily at 6 PM. 90 tablet 3 10/05/2021 at unknown  ? clonazePAM (KLONOPIN) 0.5 MG tablet Take 0.5 mg by mouth 2 (two) times daily as needed.    prn at prn  ? clopidogrel (PLAVIX) 75 MG tablet Take 1 tablet (75 mg total) by mouth daily. 90 tablet 0 10/06/2021 at unknown  ? donepezil (ARICEPT) 10 MG tablet Take 1 tablet (10 mg total) by mouth at bedtime. 90 tablet 3 10/06/2021 at unknown  ? famotidine (PEPCID) 20 MG tablet Take 20 mg by mouth 2 (two) times daily.   10/06/2021 at unknown  ? Multiple Vitamins-Minerals (ONE-A-DAY WOMENS PO) Take 1 tablet by mouth  daily.   10/06/2021 at unknown  ? PARoxetine (PAXIL) 10 MG tablet Take 10 mg by mouth daily.   10/06/2021 at unknown  ? nitrofurantoin (MACRODANTIN) 100 MG capsule Take 1 capsule (100 mg total) by mouth 4 (four) times daily. (Patient not taking: Reported on 10/07/2021) 40 capsule 0 Completed Course  ? ?Scheduled: ?  stroke: early stages of recovery book   Does not apply Once  ? aspirin EC  81 mg Oral Daily  ? atorvastatin  80 mg Oral q1800  ? clopidogrel  75 mg Oral Daily  ? donepezil  10 mg Oral QHS  ? enoxaparin (LOVENOX) injection  30 mg Subcutaneous Q24H  ? potassium chloride  40 mEq Oral BID  ? ?Continuous: ? ampicillin-sulbactam (UNASYN) IV Stopped (10/07/21 4132)  ? ? ? ?ROS:                                                                                                                                       ?  As per HPI.  ? ? ?Blood pressure (!) 151/70, pulse 77, temperature 98.3 ?F (36.8 ?C), resp. rate 14, weight 54.5 kg, SpO2 100 %. ? ? ?General Examination:                                                                                                      ? ?Physical Exam  ?HEENT-  Bell/AT    ?Lungs- Respirations unlabored ?Extremities- No edema ? ?Neurological Examination ?Mental Status: Awake and alert with depressed affect. Oriented x 5. Speech is fluent without dysarthria. Naming intact for thumb and pinky, but patient could not name the other 3 fingers. Able to follow all commands without difficulty. Repetition intact.  ?Cranial Nerves: ?II: Temporal visual fields intact without extinction to DSS. PERRL.   ?III,IV, VI: No ptosis. EOMI. No nystagmus.  ?V: Temp sensation decreased on the left.  ?VII: Evidence of mild weakness to left lower quadrant of face.  ?VIII: Hearing intact to voice ?IX,X: No hypophonia or hoarseness ?XI: Head is midline ?XII: Midline tongue extension ?Motor: ?Right : Upper extremity   4+/5   Left:     Upper extremity   4+/5 ? Lower extremity   4+/5    Lower extremity   4+/5 ?No  pronator drift ?Sensory: Decreased temp sensation to RUE on first trial, then equal to left upper on second trial. FT intact to forearms bilaterally. FT and temp  intact to lower legs bilaterally. No extinction to DSS.  ?Deep Tendon Reflexes: 2+ bilateral brachioradialis. Unable to elicit patellars in the context of prior knee operations.  ?Cerebellar: No ataxia with FNF bilaterally  ?Gait: Deferred ?  ?Lab Results: ?Basic Metabolic Panel: ?Recent Labs  ?Lab 10/07/21 ?0006  ?NA 137  ?K 3.0*  ?CL 102  ?CO2 26  ?GLUCOSE 120*  ?BUN 19  ?CREATININE 1.21*  ?CALCIUM 8.7*  ? ? ?CBC: ?Recent Labs  ?Lab 10/07/21 ?0006 10/07/21 ?1805  ?WBC 12.2* 12.0*  ?NEUTROABS 10.7* 10.3*  ?HGB 11.9* 13.5  ?HCT 35.7* 40.8  ?MCV 101.7* 103.6*  ?PLT 305 263  ? ? ?Cardiac Enzymes: ?No results for input(s): CKTOTAL, CKMB, CKMBINDEX, TROPONINI in the last 168 hours. ? ?Lipid Panel: ?Recent Labs  ?Lab 10/07/21 ?0006  ?CHOL 193  ?TRIG 68  ?HDL 71  ?CHOLHDL 2.7  ?VLDL 14  ?LDLCALC 108*  ? ? ?Imaging: ?MR BRAIN WO CONTRAST ? ?Result Date: 10/07/2021 ?CLINICAL DATA:  Acute neurologic deficit EXAM: MRI HEAD WITHOUT CONTRAST TECHNIQUE: Multiplanar, multiecho pulse sequences of the brain and surrounding structures were obtained without intravenous contrast. COMPARISON:  01/25/2020 FINDINGS: Brain: There are small foci of abnormal diffusion restriction within the ventral right thalamus and within the right centrum semiovale. No acute or chronic hemorrhage. Hyperintense T2-weighted signal is moderately widespread throughout the white matter. Generalized volume loss without a clear lobar predilection. The midline structures are normal. Vascular: Major flow voids are preserved. Skull and upper cervical spine: Normal calvarium and skull base. Visualized upper cervical spine and soft tissues are normal. Sinuses/Orbits:No paranasal sinus fluid levels or advanced mucosal thickening. No mastoid or middle ear  effusion. Normal orbits. IMPRESSION: 1. Small foci of  acute ischemia within the ventral right thalamus and right centrum semiovale. No hemorrhage or mass effect. 2. Moderate chronic small vessel ischemic disease and generalized volume loss. Electronically [employer]

## 2021-10-07 NOTE — Assessment & Plan Note (Signed)
Continue donepezil 

## 2021-10-07 NOTE — Care Management Obs Status (Signed)
MEDICARE OBSERVATION STATUS NOTIFICATION ? ? ?Patient Details  ?Name: Gabriella Rogers ?MRN: [id number] ?Date of Birth: May 27, 1934 ? ? ?Medicare Observation Status Notification Given:  Yes ? ? ? ?Allayne Butcher, RN ?10/07/2021, 9:47 AM ?

## 2021-10-07 NOTE — Progress Notes (Signed)
*  PRELIMINARY RESULTS* ?Echocardiogram ?2D Echocardiogram has been performed. ? ?Gabriella Rogers ?10/07/2021, 11:22 AM ?

## 2021-10-07 NOTE — Assessment & Plan Note (Signed)
Receives Botox by [facility] neurology ?Continue clonazepam as needed ?

## 2021-10-07 NOTE — ED Notes (Signed)
Patient anxious, worsening tremors noted to arms. Reports she takes klonopin every day.  ?

## 2021-10-07 NOTE — Evaluation (Signed)
Physical Therapy Evaluation ?Patient Details ?Name: Gabriella Rogers ?MRN: [id number] ?DOB: Jan 01, 1934 ?Today's Date: 10/07/2021 ? ?History of Present Illness ? Patient is an 88 year old female with a PMH (+) for CVA on Plavix and aspirin, cervical dystonia, hyperlipidemia, osteoporosis who presented with L facial droop, and alterned mental status. ?  ?Clinical Impression ? Physical Therapy Evaluation completed this date. Patient tolerated session fairly and was agreeable to treatment. Treatment session limited due to fatigue and cognitive deficits. Patient currently lives at home with her husband in a 1 level home with 1STE and no hand rails. At baseline, patient was Mod I/Independent with all ADLs and mobility, and ambulates through furniture surfing around the home. RW and cane are available if needed. Significant BUE tremors noted at baseline. Patient is demonstrating a decrease in strength, balance, coordination, and ability to perform ADLs. Patient required Mod-Max A to complete all bed mobility. Patient is demonstrating decreased light tough bilaterally, and 3-to3/5 strength in BLEs. Sit to stand from EOB was completed at total assist with no AD and patient's armed wrapped around authors next. In standing author blocked B knees to prevent buckling. Patient was left in bed with all needs in reach and met, with family present. Patient would continue to benefit from skilled physical therapy in order to optimize patient's return to PLOF. Recommend STR upon discharge from acute hospitalization.  ?   ? ?Recommendations for follow up therapy are one component of a multi-disciplinary discharge planning process, led by the attending physician.  Recommendations may be updated based on patient status, additional functional criteria and insurance authorization. ? ?Follow Up Recommendations Skilled nursing-short term rehab (<3 hours/day) ? ?  ?Assistance Recommended at Discharge Frequent or constant Supervision/Assistance   ?Patient can return home with the following ? Direct supervision/assist for financial management;A lot of help with walking and/or transfers;Direct supervision/assist for medications management;A lot of help with bathing/dressing/bathroom ? ?  ?Equipment Recommendations Other (comment) (TBD at next level of care)  ?Recommendations for Other Services ?    ?  ?Functional Status Assessment Patient has had a recent decline in their functional status and demonstrates the ability to make significant improvements in function in a reasonable and predictable amount of time.  ? ?  ?Precautions / Restrictions Precautions ?Precautions: Fall ?Restrictions ?Weight Bearing Restrictions: No  ? ?  ? ?Mobility ? Bed Mobility ?Overal bed mobility: Needs Assistance ?Bed Mobility: Supine to Sit, Sit to Supine ?  ?  ?Supine to sit: Mod assist (patient was able to move BLEs to EOB, however required Mod A to complete supine to sit transfer) ?Sit to supine: Max assist ?  ?  ?  ? ?Transfers ?Overall transfer level: Needs assistance ?Equipment used: None ?Transfers: Sit to/from Stand ?Sit to Stand: Total assist ?  ?  ?  ?  ?  ?  ?  ? ?Ambulation/Gait ?  ?  ?  ?  ?  ?  ?  ?General Gait Details: deferred for safety ? ?Stairs ?  ?  ?  ?  ?  ? ?Wheelchair Mobility ?  ? ?Modified Rankin (Stroke Patients Only) ?  ? ?  ? ?Balance Overall balance assessment: Needs assistance ?Sitting-balance support: Bilateral upper extremity supported, Feet supported ?Sitting balance-Leahy Scale: Poor ?Sitting balance - Comments: Min A to maintain upright posture, significant use of BUEs ?Postural control: Left lateral lean ?  ?Standing balance-Leahy Scale: Zero ?  ?  ?  ?  ?  ?  ?  ?  ?  ?  ?  ?  ?   ? ? ? ?  Pertinent Vitals/Pain Pain Assessment ?Pain Score: 1  ?Faces Pain Scale: Hurts a little bit ?Pain Location: generalized ?Pain Descriptors / Indicators: Grimacing ?Pain Intervention(s): Limited activity within patient's tolerance, Monitored during session,  Repositioned  ? ? ?Home Living Family/patient expects to be discharged to:: Private residence ?Living Arrangements: Spouse/significant other ?Available Help at Discharge: Family;Available 24 hours/day (husband is available 24/7, other family members intermittently) ?Type of Home: House ?Home Access: Stairs to enter ?Entrance Stairs-Rails: None ?Entrance Stairs-Number of Steps: 1 ?  ?Home Layout: One level ?Home Equipment: Grab bars - tub/shower;Rolling Walker (2 wheels);Cane - single point ?   ?  ?Prior Function Prior Level of Function : Independent/Modified Independent ?  ?  ?  ?  ?  ?  ?Mobility Comments: MOD I with amb household distances with increased time, reaching outwardly for wall support per daugther and husband report; has access to a cane/walker ?ADLs Comments: MOD I- I for ADLs, takes sink bath; husband assists with IADLS (cooking, cleaning, driving) ?  ? ? ?Hand Dominance  ? Dominant Hand: Left ? ?  ?Extremity/Trunk Assessment  ? Upper Extremity Assessment ?Upper Extremity Assessment: Generalized weakness (Significant BUE tremors) ?  ? ?Lower Extremity Assessment ?Lower Extremity Assessment: Generalized weakness;RLE deficits/detail;LLE deficits/detail (at least 3-to3/5 strength bilaterally) ?RLE Sensation: decreased light touch ?RLE Coordination: decreased fine motor ?LLE Sensation: decreased light touch ?LLE Coordination: decreased fine motor ?  ? ?   ?Communication  ? Communication: No difficulties  ?Cognition Arousal/Alertness: Awake/alert ?Behavior During Therapy: Flat affect ?Overall Cognitive Status: Impaired/Different from baseline ?Area of Impairment: Orientation, Attention, Memory, Following commands ?  ?  ?  ?  ?  ?  ?  ?  ?Orientation Level: Disoriented to, Situation, Time ?Current Attention Level: Sustained ?Memory: Decreased recall of precautions, Decreased short-term memory ?Following Commands: Follows one step commands consistently, Follows multi-step commands inconsistently ?  ?  ?   ?General Comments: pt has baseline dementia at baseline, patient was oriented to self and location ?  ?  ? ?  ?General Comments General comments (skin integrity, edema, etc.): BP: 138/82mHg, HR: 80bpm, SpO2: 90% ? ?  ?Exercises Other Exercises ?Other Exercises: Patient educated on role of PT in acute setting, fall risk, and d/c recommendations  ? ?Assessment/Plan  ?  ?PT Assessment Patient needs continued PT services  ?PT Problem List Decreased strength;Decreased mobility;Decreased safety awareness;Decreased knowledge of precautions;Decreased range of motion;Decreased coordination;Decreased activity tolerance;Decreased balance;Impaired sensation;Pain;Decreased skin integrity ? ?   ?  ?PT Treatment Interventions DME instruction;Therapeutic exercise;Gait training;Balance training;Stair training;Neuromuscular re-education;Functional mobility training;Therapeutic activities;Patient/family education;Cognitive remediation   ? ?PT Goals (Current goals can be found in the Care Plan section)  ?Acute Rehab PT Goals ?Patient Stated Goal: to get better ?PT Goal Formulation: With patient ?Time For Goal Achievement: 10/21/21 ?Potential to Achieve Goals: Fair ? ?  ?Frequency 7X/week ?  ? ? ?Co-evaluation   ?  ?  ?  ?  ? ? ?  ?AM-PAC PT "6 Clicks" Mobility  ?Outcome Measure Help needed turning from your back to your side while in a flat bed without using bedrails?: A Lot ?Help needed moving from lying on your back to sitting on the side of a flat bed without using bedrails?: A Lot ?Help needed moving to and from a bed to a chair (including a wheelchair)?: A Lot ?Help needed standing up from a chair using your arms (e.g., wheelchair or bedside chair)?: A Lot ?Help needed to walk in hospital room?: A Lot ?Help needed climbing 3-5 steps  with a railing? : A Lot ?6 Click Score: 12 ? ?  ?End of Session Equipment Utilized During Treatment: Gait belt ?Activity Tolerance: Patient tolerated treatment well;Other (comment) (limited by  cognitive deficits) ?Patient left: in bed;with bed alarm set;with family/visitor present ?Nurse Communication: Mobility status ?PT Visit Diagnosis: Unsteadiness on feet (R26.81);History of falling (Z91.81);Other abnorma

## 2021-10-07 NOTE — ED Notes (Signed)
Echo at bedside

## 2021-10-07 NOTE — ED Provider Notes (Signed)
? ?[facility] ?Provider Note ? ? ? Event Date/Time  ? First MD Initiated Contact with Patient 10/06/21 2337   ?  (approximate) ? ? ?History  ? ?Weakness, Aphasia, and Facial Droop ? ? ?HPI ? ?Gabriella Rogers is a 88 y.o. female with a history of CVA on Plavix and aspirin, cervical dystonia, hyperlipidemia, osteoporosis who presents for evaluation of altered mental status.  History is gathered from patient and her husband who is at bedside.  According to husband patient was in her usual state of health 24 hours ago when they went to bed.  This morning when they woke up patient was not feeling well and sat in her recliner.  Patient's husband said that throughout the day she did not get up from the recliner, she was having difficulty expressing herself and using some words.  She felt very weak.  He waited all day in the hopes that she would get better but this evening when she was not getting any better he decided to call 911.  Patient does tell me that she does not feel well but is unable to give me any more details.  She denies a headache, chest pain.  She has not had a fever or cough at home.  There is no shortness of breath or abdominal pain. ?  ? ? ?Past Medical History:  ?Diagnosis Date  ? Brain disorder 03/27/2014  ? Cervical dystonia 01/24/2012  ? Clinical depression 11/26/2015  ? Dizziness 11/26/2015  ? Overview:  Chronic, felt secondary to inner ear per ENT evaluation approximately 1996.   ? H/O total knee replacement 11/23/2015  ? Headache, migraine 11/26/2015  ? HLD (hyperlipidemia) 11/26/2015  ? Hypercholesteremia   ? Osteoporosis, post-menopausal 11/26/2015  ? ? ?Past Surgical History:  ?Procedure Laterality Date  ? ABDOMINAL HYSTERECTOMY    ? ANKLE SURGERY Left   ? KNEE SURGERY Right   ? ? ? ?Physical Exam  ? ?Triage Vital Signs: ?ED Triage Vitals  ?Enc Vitals Group  ?   BP 10/06/21 2333 130/64  ?   Pulse Rate 10/06/21 2333 72  ?   Resp 10/06/21 2333 16  ?   Temp 10/06/21 2333 98.5 ?F (36.9 ?C)   ?   Temp Source 10/06/21 2333 Oral  ?   SpO2 10/06/21 2326 96 %  ?   Weight 10/06/21 2335 120 lb 2.4 oz (54.5 kg)  ?   Height --   ?   Head Circumference --   ?   Peak Flow --   ?   Pain Score 10/06/21 2334 0  ?   Pain Loc --   ?   Pain Edu? --   ?   Excl. in GC? --   ? ? ?Most recent vital signs: ?Vitals:  ? 10/06/21 2333 10/07/21 0100  ?BP: 130/64 135/62  ?Pulse: 72 69  ?Resp: 16 18  ?Temp: 98.5 ?F (36.9 ?C)   ?SpO2: 97% 98%  ? ? ? ?Constitutional: Alert and oriented to self only, no distress.  ?HEENT: ?     Head: Normocephalic and atraumatic.    ?     Eyes: Conjunctivae are normal. Sclera is non-icteric.  ?     Mouth/Throat: Mucous membranes are moist.  ?     Neck: Supple with no signs of meningismus. ?Cardiovascular: Regular rate and rhythm. No murmurs, gallops, or rubs. 2+ symmetrical distal pulses are present in all extremities.  ?Respiratory: Normal respiratory effort. Lungs are clear to auscultation bilaterally.  ?  Gastrointestinal: Soft, non tender, and non distended with positive bowel sounds. No rebound or guarding. ?Genitourinary: No CVA tenderness. ?Musculoskeletal:  No edema, cyanosis, or erythema of extremities. ?Neurologic: Patient having some difficulty findings words, no slurred speech.  Face is symmetric.  EOMI, equal strength and sensation bilaterally. No dysmetria but patient seems confused when asked to touch her nose and my finger with the L side ?Skin: Skin is warm, dry and intact. No rash noted. ?Psychiatric: Mood and affect are normal. Speech and behavior are normal. ? ?ED Results / Procedures / Treatments  ? ?Labs ?(all labs ordered are listed, but only abnormal results are displayed) ?Labs Reviewed  ?CBC - Abnormal; Notable for the following components:  ?    Result Value  ? WBC 12.2 (*)   ? RBC 3.51 (*)   ? Hemoglobin 11.9 (*)   ? HCT 35.7 (*)   ? MCV 101.7 (*)   ? All other components within normal limits  ?DIFFERENTIAL - Abnormal; Notable for the following components:  ? Neutro Abs  10.7 (*)   ? All other components within normal limits  ?COMPREHENSIVE METABOLIC PANEL - Abnormal; Notable for the following components:  ? Potassium 3.0 (*)   ? Glucose, Bld 120 (*)   ? Creatinine, Ser 1.21 (*)   ? Calcium 8.7 (*)   ? GFR, Estimated 44 (*)   ? All other components within normal limits  ?URINE DRUG SCREEN, QUALITATIVE ([facility] ONLY) - Abnormal; Notable for the following components:  ? Benzodiazepine, Ur Scrn POSITIVE (*)   ? All other components within normal limits  ?URINALYSIS, ROUTINE W REFLEX MICROSCOPIC - Abnormal; Notable for the following components:  ? Color, Urine AMBER (*)   ? APPearance HAZY (*)   ? Hgb urine dipstick SMALL (*)   ? Protein, ur 100 (*)   ? Bacteria, UA RARE (*)   ? All other components within normal limits  ?CBG MONITORING, ED - Abnormal; Notable for the following components:  ? Glucose-Capillary 131 (*)   ? All other components within normal limits  ?RESP PANEL BY RT-PCR (FLU A&B, COVID) ARPGX2  ?ETHANOL  ?PROTIME-INR  ?APTT  ? ? ? ?EKG ? ?ED ECG REPORT ?I, Nita Sickle, the attending physician, personally viewed and interpreted this ECG. ? ?Sinus rhythm with a rate of 71, normal intervals, normal axis, no ST elevations or depressions ? ?RADIOLOGY ?I, Nita Sickle, attending MD, have personally viewed and interpreted the images obtained during this visit as below: ? ?CT head with no acute pathology ? ? ?___________________________________________________ ?Interpretation by Radiologist:  ?MR BRAIN WO CONTRAST ? ?Result Date: 10/07/2021 ?CLINICAL DATA:  Acute neurologic deficit EXAM: MRI HEAD WITHOUT CONTRAST TECHNIQUE: Multiplanar, multiecho pulse sequences of the brain and surrounding structures were obtained without intravenous contrast. COMPARISON:  01/25/2020 FINDINGS: Brain: There are small foci of abnormal diffusion restriction within the ventral right thalamus and within the right centrum semiovale. No acute or chronic hemorrhage. Hyperintense T2-weighted  signal is moderately widespread throughout the white matter. Generalized volume loss without a clear lobar predilection. The midline structures are normal. Vascular: Major flow voids are preserved. Skull and upper cervical spine: Normal calvarium and skull base. Visualized upper cervical spine and soft tissues are normal. Sinuses/Orbits:No paranasal sinus fluid levels or advanced mucosal thickening. No mastoid or middle ear effusion. Normal orbits. IMPRESSION: 1. Small foci of acute ischemia within the ventral right thalamus and right centrum semiovale. No hemorrhage or mass effect. 2. Moderate chronic small vessel ischemic disease and  generalized volume loss. Electronically Signed   By: Deatra Robinson M.D.   On: 10/07/2021 01:59  ? ?CT HEAD CODE STROKE WO CONTRAST ? ?Result Date: 10/07/2021 ?CLINICAL DATA:  Code stroke. EXAM: CT HEAD WITHOUT CONTRAST TECHNIQUE: Contiguous axial images were obtained from the base of the skull through the vertex without intravenous contrast. RADIATION DOSE REDUCTION: This exam was performed according to the departmental dose-optimization program which includes automated exposure control, adjustment of the mA and/or kV according to patient size and/or use of iterative reconstruction technique. COMPARISON:  01/26/2020 FINDINGS: Brain: There is no mass, hemorrhage or extra-axial collection. The size and configuration of the ventricles and extra-axial CSF spaces are normal. There is hypoattenuation of the periventricular white matter, most commonly indicating chronic ischemic microangiopathy. Vascular: No abnormal hyperdensity of the major intracranial arteries or dural venous sinuses. No intracranial atherosclerosis. Skull: The visualized skull base, calvarium and extracranial soft tissues are normal. Sinuses/Orbits: No fluid levels or advanced mucosal thickening of the visualized paranasal sinuses. No mastoid or middle ear effusion. The orbits are normal. ASPECTS [facility] Stroke Program  Early CT Score) - Ganglionic level infarction (caudate, lentiform nuclei, internal capsule, insula, M1-M3 cortex): 7 - Supraganglionic infarction (M4-M6 cortex): 3 Total score (0-10 with 10 being normal): 10

## 2021-10-07 NOTE — Progress Notes (Signed)
PHARMACY NOTE:  ANTIMICROBIAL RENAL DOSAGE ADJUSTMENT ? ?Current antimicrobial regimen includes a mismatch between antimicrobial dosage and estimated renal function.  As per policy approved by the Pharmacy & Therapeutics and Medical Executive Committees, the antimicrobial dosage will be adjusted accordingly. ? ?Current antimicrobial dosage:  Unasyn 1.5 gm IV q6h ? ?Indication: asp PNA ? ?Renal Function: ? ?Estimated Creatinine Clearance: 27.6 mL/min (A) (by C-G formula based on SCr of 1.21 mg/dL (H)). ? ?Antimicrobial dosage has been changed to:   ?Unasyn 3gm IV q12h ? ?Additional comments: ? ? ?Thank you for allowing pharmacy to be a part of this patient's care. ? ?Alinda Egolf A, RPH ?10/07/2021 8:36 AM ?

## 2021-10-08 ENCOUNTER — Inpatient Hospital Stay: Payer: Medicare HMO

## 2021-10-08 DIAGNOSIS — I639 Cerebral infarction, unspecified: Secondary | ICD-10-CM | POA: Diagnosis not present

## 2021-10-08 LAB — BASIC METABOLIC PANEL
Anion gap: 4 — ABNORMAL LOW (ref 5–15)
BUN: 11 mg/dL (ref 8–23)
CO2: 24 mmol/L (ref 22–32)
Calcium: 8.3 mg/dL — ABNORMAL LOW (ref 8.9–10.3)
Chloride: 110 mmol/L (ref 98–111)
Creatinine, Ser: 0.68 mg/dL (ref 0.44–1.00)
GFR, Estimated: >60 mL/min (ref >60)
Glucose, Bld: 94 mg/dL (ref 70–99)
Potassium: 3.9 mmol/L (ref 3.5–5.1)
Sodium: 138 mmol/L (ref 135–145)

## 2021-10-08 LAB — PROCALCITONIN: Procalcitonin: 0.75 ng/mL

## 2021-10-08 MED ORDER — CYANOCOBALAMIN 1000 MCG/ML IJ SOLN
1000.0000 ug | Freq: Once | INTRAMUSCULAR | Status: AC
  Administered 2021-10-08: 1000 ug via INTRAMUSCULAR
  Filled 2021-10-08: qty 1

## 2021-10-08 MED ORDER — SODIUM CHLORIDE 0.9 % IV SOLN
3.0000 g | Freq: Four times a day (QID) | INTRAVENOUS | Status: DC
  Filled 2021-10-08 (×2): qty 8

## 2021-10-08 MED ORDER — FAMOTIDINE 20 MG PO TABS
20.0000 mg | ORAL_TABLET | Freq: Every day | ORAL | Status: DC
  Administered 2021-10-08 – 2021-10-15 (×8): 20 mg via ORAL
  Filled 2021-10-08 (×8): qty 1

## 2021-10-08 MED ORDER — PAROXETINE HCL 10 MG PO TABS
10.0000 mg | ORAL_TABLET | Freq: Every day | ORAL | Status: DC
  Administered 2021-10-08 – 2021-10-15 (×8): 10 mg via ORAL
  Filled 2021-10-08 (×8): qty 1

## 2021-10-08 MED ORDER — VITAMIN B-12 1000 MCG PO TABS
1000.0000 ug | ORAL_TABLET | Freq: Every day | ORAL | Status: DC
  Administered 2021-10-09 – 2021-10-15 (×7): 1000 ug via ORAL
  Filled 2021-10-08 (×7): qty 1

## 2021-10-08 MED ORDER — IOHEXOL 350 MG/ML SOLN
75.0000 mL | Freq: Once | INTRAVENOUS | Status: AC | PRN
  Administered 2021-10-08: 75 mL via INTRAVENOUS

## 2021-10-08 MED ORDER — ENOXAPARIN SODIUM 40 MG/0.4ML IJ SOSY
40.0000 mg | PREFILLED_SYRINGE | INTRAMUSCULAR | Status: DC
  Administered 2021-10-08 – 2021-10-13 (×6): 40 mg via SUBCUTANEOUS
  Filled 2021-10-08 (×6): qty 0.4

## 2021-10-08 NOTE — PMR Pre-admission (Shared)
PMR Admission Coordinator Pre-Admission Assessment ? ?Patient: Gabriella Rogers is an 88 y.o., female ?MRN: [id number] ?DOB: 03-Sep-1933 ?Height:   ?Weight: 54.5 kg ? ?Insurance Information ?HMO:     PPO:      PCP:      IPA:      80/20:      OTHER:  ?PRIMARY: [employer] Medicare      Policy#: [id number]      Subscriber: pt ?CM Name: ***      Phone#: ***     Fax#: [phone] ?Pre-Cert#: [id number]      Employer:  ?Benefits:  Phone #: [phone]     Name: 4/20 ?Eff. Date: 06/21/2020     Deduct: none      Out of Pocket Max: $1000      Life Max: none ?CIR: $150 co pay per admission      SNF: 100% coverage ?Outpatient: $10 to $20 per visit     Co-Pay: visits per medical neccesity ?Home Health: 100%      Co-Pay: visits per medical neccesity ?DME: 100%     Co-Pay: none ?Providers: in network ? ?SECONDARY: none ? ?Financial Counselor:       Phone#:  ? ?The ?Data Collection Information Summary? for patients in Inpatient Rehabilitation Facilities with attached ?Privacy Act [place] Records? was provided and verbally reviewed with: Family ? ?Emergency Contact Information ?Contact Information   ? ? Name Relation Home Work Mobile  ? [name] Daughter   [phone]  ? [name],[name] Spouse [phone]    ? ?  ? ? ?Current Medical History  ?Patient Admitting Diagnosis: CVA ? ?History of Present Illness: 88 year old female with medical history of Mild cognitive impairment, cervical dystonia on Botox shots,  migraines, HLD and osteoporosis, prior CVA 8/21 who presented on 10/07/21 to the ED with concerns for weakness and difficulty with speech. WBC of 12 K noted in the ED. ? ?MRI shoed a small acute ischemic CVA right thalamus and right central semi ovale. Neurology consult;ed. Loaded with Asa and Plavix and to remain on indefinitely. LDL 108. On atorvastin at home and to resume. Normotensive at goal. CT angio head and neck pending. 2D echo normal with no thrombus.  ? ?Febrile with temp 103 with tachycardia. CXR with  no obvious PNA. Urine normal Suspected aspiration pneumonitis. Started Unasyn. Afebrile last 24 hrs. SLP with no obvious aspiration. Cervical dystonia on donepezil. Received Botox with Neurology at [facility]. Also on clonazepam. B12 with serum level 159. Gave injectable B12 and will discharge on oral B12. Lovenox for DVT prophylaxis.  ? ?Complete NIHSS TOTAL: 2 ? ?Patient's medical record from [facility] has been reviewed by the rehabilitation admission coordinator and physician. ? ?Past Medical History  ?Past Medical History:  ?Diagnosis Date  ? Brain disorder 03/27/2014  ? Cervical dystonia 01/24/2012  ? Clinical depression 11/26/2015  ? Dizziness 11/26/2015  ? Overview:  Chronic, felt secondary to inner ear per ENT evaluation approximately 1996.   ? H/O total knee replacement 11/23/2015  ? Headache, migraine 11/26/2015  ? HLD (hyperlipidemia) 11/26/2015  ? Hypercholesteremia   ? Osteoporosis, post-menopausal 11/26/2015  ? ?Has the patient had major surgery during 100 days prior to admission? No ? ?Family History   ?family history includes Stroke in her maternal grandfather and paternal grandfather. ? ?Current Medications ? ?Current Facility-Administered Medications:  ?   stroke: early stages of recovery book, , Does not apply, Once, Athena Masse, MD ?  acetaminophen (TYLENOL) tablet 650 mg, 650  mg, Oral, Q4H PRN, 650 mg at 10/07/21 1319 **OR** acetaminophen (TYLENOL) 160 MG/5ML solution 650 mg, 650 mg, Per Tube, Q4H PRN **OR** acetaminophen (TYLENOL) suppository 650 mg, 650 mg, Rectal, Q4H PRN, Athena Masse, MD ?  aspirin EC tablet 81 mg, 81 mg, Oral, Daily, Judd Gaudier V, MD, 81 mg at 10/08/21 1115 ?  atorvastatin (LIPITOR) tablet 80 mg, 80 mg, Oral, q1800, Athena Masse, MD, 80 mg at 10/07/21 1819 ?  clonazePAM (KLONOPIN) tablet 0.5 mg, 0.5 mg, Oral, BID PRN, Athena Masse, MD, 0.5 mg at 10/08/21 0034 ?  clopidogrel (PLAVIX) tablet 75 mg, 75 mg, Oral, Daily, Judd Gaudier V, MD, 75 mg at 10/08/21 1115 ?  cyanocobalamin  ((VITAMIN B-12)) injection 1,000 mcg, 1,000 mcg, Intramuscular, Once, Barb Merino, MD ?  donepezil (ARICEPT) tablet 10 mg, 10 mg, Oral, QHS, Athena Masse, MD, 10 mg at 10/07/21 2208 ?  enoxaparin (LOVENOX) injection 40 mg, 40 mg, Subcutaneous, Q24H, Rauer, Samantha O, RPH, 40 mg at 10/08/21 1115 ?  famotidine (PEPCID) tablet 20 mg, 20 mg, Oral, Daily, Ghimire, Kuber, MD, 20 mg at 10/08/21 1454 ?  PARoxetine (PAXIL) tablet 10 mg, 10 mg, Oral, Daily, Ghimire, Kuber, MD, 10 mg at 10/08/21 1455 ?  potassium chloride SA (KLOR-CON M) CR tablet 40 mEq, 40 mEq, Oral, BID, Barb Merino, MD, 40 mEq at 10/08/21 1114 ?  [START ON 10/09/2021] vitamin B-12 (CYANOCOBALAMIN) tablet 1,000 mcg, 1,000 mcg, Oral, Daily, Barb Merino, MD ? ?Patients Current Diet:  ?Diet Order   ? ?       ?  Diet regular Room service appropriate? Yes with Assist; Fluid consistency: Thin  Diet effective now       ?  ? ?  ?  ? ?  ? ? ?Precautions / Restrictions ?Precautions ?Precautions: Fall ?Restrictions ?Weight Bearing Restrictions: No  ? ?Has the patient had 2 or more falls or a fall with injury in the past year? Yes ? ?Prior Activity Level ?Limited Community (1-2x/wk): Mod I with cane outside of home; wall surfs inside home; I adls ? ?Prior Functional Level ?Self Care: Did the patient need help bathing, dressing, using the toilet or eating? Independent ? ?Indoor Mobility: Did the patient need assistance with walking from room to room (with or without device)? Independent ? ?Stairs: Did the patient need assistance with internal or external stairs (with or without device)? Independent ? ?Functional Cognition: Did the patient need help planning regular tasks such as shopping or remembering to take medications? Needed some help ? ?Patient Information ?Are you of Hispanic, Latino/a,or Spanish origin?: A. No, not of Hispanic, Latino/a, or Spanish origin ?What is your race?: A. White ?Do you need or want an interpreter to communicate with a doctor  or health care staff?: 0. No ? ?Patient's Response To:  ?Health Literacy and Transportation ?Is the patient able to respond to health literacy and transportation needs?: Yes ?Health Literacy - How often do you need to have someone help you when you read instructions, pamphlets, or other written material from your doctor or pharmacy?: Never ?In the past 12 months, has lack of transportation kept you from medical appointments or from getting medications?: No ?In the past 12 months, has lack of transportation kept you from meetings, work, or from getting things needed for daily living?: No ? ?Home Assistive Devices / Equipment ?Home Assistive Devices/Equipment: Gilford Rile (specify type), Cane (specify quad or straight), Eyeglasses, Raised toilet seat with rails ?Home Equipment: Grab bars - tub/shower, Rolling  Walker (2 wheels), Cane - single point ? ?Prior Device Use: Indicate devices/aids used by the patient prior to current illness, exacerbation or injury? Cane in community but wall surfs in home ? ?Current Functional Level ?Cognition ? Overall Cognitive Status: Impaired/Different from baseline ?Current Attention Level: Sustained ?Orientation Level: Oriented to person, Disoriented to situation, Disoriented to time, Disoriented to place ?Following Commands: Follows one step commands consistently, Follows multi-step commands inconsistently ?Safety/Judgement: Decreased awareness of safety ?General Comments: Pt presents with baseline dementia (however completes ADL with MOD I at baseline level); Much improved mentation as compared to evaluation. Pt is alert and oriented x4 presents with deficits as noted above. Fair recall for home set up. ?   ?Extremity Assessment ?(includes Sensation/Coordination) ? Upper Extremity Assessment: Generalized weakness, RUE deficits/detail ?RUE Deficits / Details: will continue to assess ?RUE Coordination: decreased fine motor, decreased gross motor  ?Lower Extremity Assessment: Generalized  weakness, RLE deficits/detail, LLE deficits/detail (at least 3-to3/5 strength bilaterally) ?RLE Sensation: decreased light touch ?RLE Coordination: decreased fine motor ?LLE Sensation: decreased light touch ?LL

## 2021-10-08 NOTE — Progress Notes (Signed)
Inpatient Rehab Admissions Coordinator Note:  ? ?Per updated PT recommendations patient was screened for CIR candidacy by Michel Santee, PT. At this time, pt appears to be a potential candidate for CIR. I will place an order for rehab consult for full assessment, per our protocol.  Please contact me any with questions.. ? ?Shann Medal, PT, DPT ?[phone] ?10/08/21 ?2:35 PM  ?

## 2021-10-08 NOTE — TOC Progression Note (Signed)
Transition of Care (TOC) - Progression Note  ? ? ?Patient Details  ?Name: Gabriella Rogers ?MRN: [id number] ?Date of Birth: 1934/04/18 ? ?Transition of Care (TOC) CM/SW Contact  ?Pete Pelt, RN ?Phone Number: ?10/08/2021, 2:51 PM ? ?Clinical Narrative:   Spoke to daughter.  Patient is currently waiting for an assessment from [facility].  If patient is not a candidate, daughter agrees with attempting to find local SNF.  Patient currently lives at home with spouse. ? ? ? ?Expected Discharge Plan: [place] ?Barriers to Discharge: Continued Medical Work up ? ?Expected Discharge Plan and Services ?Expected Discharge Plan: [place] ?  ?Discharge Planning Services: CM Consult ?  ?Living arrangements for the past 2 months: [place] ?                ?DME Arranged: N/A ?DME Agency: NA ?  ?  ?  ?  ?  ?  ?  ?  ? ? ?Social Determinants of Health (SDOH) Interventions ?  ? ?Readmission Risk Interventions ?   ? View : No data to display.  ?  ?  ?  ? ? ?

## 2021-10-08 NOTE — Progress Notes (Signed)
Inpatient Rehabilitation Admissions Coordinator  ? ?Inpt rehab consult received. I spoke with patient's daughter/POA, [name] by phone. We discussed goals and expectations of a possible CIR admit. We also reviewed estimated cost of care if approved by [facility]. She prefers Cir admit. I will begin Auth with [facility]. Cir admit at [facility] [employer]. ? ?Ottie Glazier, RN, MSN ?Rehab Admissions Coordinator ?[phone] ?10/08/2021 3:36 PM ? ?

## 2021-10-08 NOTE — Progress Notes (Signed)
?PROGRESS NOTE ? ? ? ?Gabriella Rogers  [id number] DOB: [birth year] DOA: 10/06/2021 ?PCP: Cletis Athens, MD  ? ? ?Brief Narrative:  ?88 year old with history of mild cognitive impairment from home, prior stroke and 2021 on aspirin currently, hyperlipidemia brought to ER with weakness and difficulty getting her words out.  In the emergency room hemodynamically stable.  No focal neurological deficit.  Patient was found to have a small acute ischemic stroke right thalamus and right central semiovale.  She was also found with febrile episode. ? ? ?Assessment & Plan: ?  ?Febrile episode: Temperature 103 with tachycardia. ?Chest x-ray with no obvious pneumonia, COVID-19 and flu negative.  Urinalysis fairly normal.  Upper airway sounds noted after patient was given medications.  ?Suspect aspiration pneumonitis, lactic acid and procalcitonin normal.  Given high temperature, blood cultures were drawn and patient was started on Unasyn.  Afebrile last 24 hours.  With no source of infection, will discontinue all antibiotics and monitor. ?Seen by speech therapy, found to have no obvious aspiration. ?  ?Acute ischemic stroke: ?Neurochecks and vital signs as per stroke protocol. ?Patient was not a TPA and vascular intervention candidate because of very minor symptoms. ?Diet, seen by speech.  She was able to eat. ?Antiplatelets, on aspirin at home.  Loaded with aspirin and Plavix.  Recommended indefinite aspirin and Plavix. ?Statin, LDL 108.  On atorvastatin 80 mg at home, will resume. ?Blood pressure goals, normotensive.  At goal. ?Consultations, neurology, speech, PT OT.  Following. ?MRI of the brain as above. ?CT angiogram head and neck, ordered today pending.  Continue IV fluid hydration. ?2D echocardiogram with normal ejection fraction, no evidence of intracardiac thrombus. ? ?Acute kidney injury: Improved and normalized. ? ?Mild cognitive impairment and cervical dystonia: On donepezil.  Received B[facility] with neurology at [facility].   She is also on clonazepam as needed. ? ?Vitamin B12 deficiency: Screening test for vitamin B12 on admission with serum level of 159.  We will give her injectable B12 while in the hospital and discharged on oral B12. ? ?DVT prophylaxis: enoxaparin (LOVENOX) injection 40 mg Start: 10/08/21 1020 ? ? ?Code Status: Full code ?Family Communication: Daughter on the phone ?Disposition Plan: Status is: Inpatient ?Remains inpatient appropriate because: Stroke work-up in progress. ?  ? ? ?Consultants:  ?Neurology ? ?Procedures:  ?None ? ?Antimicrobials:  ?Unasyn 4/19-4/20 ? ? ?Subjective: ?Patient seen and examined.  No overnight events.  Remains afebrile.  She was more comfortable today.  Denies any complaints.  Feels weak. ? ?Objective: ?Vitals:  ? 10/08/21 0037 10/08/21 0418 10/08/21 0844 10/08/21 1128  ?BP: (!) 151/72 (!) 167/69 (!) 145/70 (!) 153/63  ?Pulse: 81 73 82 80  ?Resp: 17 16 16 16   ?Temp: 98.5 ?F (36.9 ?C) 98.3 ?F (36.8 ?C) 98.8 ?F (37.1 ?C) 98.5 ?F (36.9 ?C)  ?TempSrc: Oral Oral Oral Oral  ?SpO2: 100% 98% 97% 97%  ?Weight:      ? ? ?Intake/Output Summary (Last 24 hours) at 10/08/2021 1323 ?Last data filed at 10/07/2021 1817 ?Gross per 24 hour  ?Intake 99.93 ml  ?Output --  ?Net 99.93 ml  ? ?Filed Weights  ? 10/06/21 2335  ?Weight: 54.5 kg  ? ? ?Examination: ? ?General exam: Appears calm and comfortable  ?Frail and debilitated.  Not in any distress.  On room air. ?Respiratory system: Clear to auscultation. Respiratory effort normal.  No added sounds. ?Cardiovascular system: S1 & S2 heard, RRR.  ?Gastrointestinal system: Soft.  Nontender.  Bowel sound present. ?Central nervous system:  Alert and oriented x3-4.  Flat affect. ?Extremities: Symmetric 5 x 5 power but generalized weakness. ? ? ? ?Data Reviewed: I have personally reviewed following labs and imaging studies ? ?CBC: ?Recent Labs  ?Lab 10/07/21 ?0006 10/07/21 ?[id number]  ?WBC 12.2* 12.0*  ?NEUTROABS 10.7* 10.3*  ?HGB 11.9* 13.5  ?HCT 35.7* 40.8  ?MCV 101.7* 103.6*   ?PLT 305 263  ? ?Basic Metabolic Panel: ?Recent Labs  ?Lab 10/07/21 ?0006 10/08/21 ?0501  ?NA 137 138  ?K 3.0* 3.9  ?CL 102 110  ?CO2 26 24  ?GLUCOSE 120* 94  ?BUN 19 11  ?CREATININE 1.21* 0.68  ?CALCIUM 8.7* 8.3*  ? ?GFR: ?Estimated Creatinine Clearance: 41.8 mL/min (by C-G formula based on SCr of 0.68 mg/dL). ?Liver Function Tests: ?Recent Labs  ?Lab 10/07/21 ?0006  ?AST 36  ?ALT 14  ?ALKPHOS 86  ?BILITOT 1.1  ?PROT 7.6  ?ALBUMIN 3.6  ? ?No results for input(s): LIPASE, AMYLASE in the last 168 hours. ?No results for input(s): AMMONIA in the last 168 hours. ?Coagulation Profile: ?Recent Labs  ?Lab 10/07/21 ?0006  ?INR 1.1  ? ?Cardiac Enzymes: ?No results for input(s): CKTOTAL, CKMB, CKMBINDEX, TROPONINI in the last 168 hours. ?BNP (last 3 results) ?No results for input(s): PROBNP in the last 8760 hours. ?HbA1C: ?Recent Labs  ?  10/07/21 ?0006  ?HGBA1C 5.5  ? ?CBG: ?Recent Labs  ?Lab 10/06/21 ?2335  ?GLUCAP 131*  ? ?Lipid Profile: ?Recent Labs  ?  10/07/21 ?0006  ?CHOL 193  ?HDL 71  ?LDLCALC 108*  ?TRIG 68  ?CHOLHDL 2.7  ? ?Thyroid Function Tests: ?No results for input(s): TSH, T4TOTAL, FREET4, T3FREE, THYROIDAB in the last 72 hours. ?Anemia Panel: ?Recent Labs  ?  10/06/21 ?2345 10/07/21 ?0006  ?[id number] 159*  --   ?FOLATE  --  16.6  ?FERRITIN  --  35  ?TIBC  --  332  ?IRON  --  19*  ? ?Sepsis Labs: ?Recent Labs  ?Lab 10/07/21 ?0718 10/08/21 ?0501  ?PROCALCITON 0.12 0.75  ?LATICACIDVEN 0.9  --   ? ? ?Recent Results (from the past 240 hour(s))  ?Resp Panel by RT-PCR (Flu A&B, Covid) Nasopharyngeal Swab     Status: None  ? Collection Time: 10/07/21 12:58 AM  ? Specimen: Nasopharyngeal Swab; Nasopharyngeal(NP) swabs in vial transport medium  ?Result Value Ref Range Status  ? SARS Coronavirus 2 by RT PCR NEGATIVE NEGATIVE Final  ?  Comment: (NOTE) ?SARS-CoV-2 target nucleic acids are NOT DETECTED. ? ?The SARS-CoV-2 RNA is generally detectable in upper respiratory ?specimens during the acute phase of infection. The  lowest ?concentration of SARS-CoV-2 viral copies this assay can detect is ?138 copies/mL. A negative result does not preclude SARS-Cov-2 ?infection and should not be used as the sole basis for treatment or ?other patient management decisions. A negative result may occur with  ?improper specimen collection/handling, submission of specimen other ?than nasopharyngeal swab, presence of viral mutation(s) within the ?areas targeted by this assay, and inadequate number of viral ?copies(<138 copies/mL). A negative result must be combined with ?clinical observations, patient history, and epidemiological ?information. The expected result is Negative. ? ?Fact Sheet for Patients:  ?EntrepreneurPulse.com.au ? ?Fact Sheet for Healthcare Providers:  ?IncredibleEmployment.be ? ?This test is no t yet approved or cleared by the [place] FDA and  ?has been authorized for detection and/or diagnosis of SARS-CoV-2 by ?FDA under an Emergency Use Authorization (EUA). This EUA will remain  ?in effect (meaning this test can be used) for the duration of the ?  COVID-19 declaration under Section 564(b)(1) of the Act, 21 ?U.S.C.section 360bbb-3(b)(1), unless the authorization is terminated  ?or revoked sooner.  ? ? ?  ? Influenza A by PCR NEGATIVE NEGATIVE Final  ? Influenza B by PCR NEGATIVE NEGATIVE Final  ?  Comment: (NOTE) ?The Xpert Xpress SARS-CoV-2/FLU/RSV plus assay is intended as an aid ?in the diagnosis of influenza from Nasopharyngeal swab specimens and ?should not be used as a sole basis for treatment. Nasal washings and ?aspirates are unacceptable for Xpert Xpress SARS-CoV-2/FLU/RSV ?testing. ? ?Fact Sheet for Patients: ?EntrepreneurPulse.com.au ? ?Fact Sheet for Healthcare Providers: ?IncredibleEmployment.be ? ?This test is not yet approved or cleared by the [place] FDA and ?has been authorized for detection and/or diagnosis of SARS-CoV-2 by ?FDA under  an Emergency Use Authorization (EUA). This EUA will remain ?in effect (meaning this test can be used) for the duration of the ?COVID-19 declaration under Section 564(b)(1) of the Act, 21 U.S.C. ?secti

## 2021-10-08 NOTE — Progress Notes (Addendum)
Physical Therapy Treatment ?Patient Details ?Name: Gabriella Rogers ?MRN: [id number] ?DOB: [birth year] ?Today's Date: 10/08/2021 ? ? ?History of Present Illness Pt is an 88 year old female admitted with small acute ischemic stroke right thalamus and right central semiovale. PMH significant for mild cognitive impairment from home, prior stroke and 2021 on aspirin currently, hyperlipidemia brought to ER with weakness and difficulty getting her words out ? ?  ?PT Comments  ? ? Physical Therapy session completed this date. Patient appeared much more alert and oriented this date, however in conversation demonstrated continued confusion. 8/10 generalized pain was noted at beginning of session, however did not limit patient's participation. Patient completed glute brides and SLR in bed for BLE strengthening. When cued to sit EOB, patient was able to do so at SUP with increased time and effort, however no physical assistance required. Upon sitting, soiled chuck pad was noted, and patient required Max A for clean up. Sit to stand x2 was completed EOB with RW at [facility] and cueing for proper hand placement. Fair standing balance noted with BUE support on RW when author was performing Max A pericare. Patient completed ~5 steps from EOB to recliner with RW at [facility] and no LOB noted. Patient was left in recliner with all needs met and in reach. Patient would continue to benefit from skilled physical therapy in order to optimize patient's return to PLOF. Continue to recommend CIR upon discharge from acute hospitalization.   ?  ?Recommendations for follow up therapy are one component of a multi-disciplinary discharge planning process, led by the attending physician.  Recommendations may be updated based on patient status, additional functional criteria and insurance authorization. ? ?Follow Up Recommendations ? Acute inpatient rehab (3hours/day) ?  ?  ?Assistance Recommended at Discharge Frequent or constant Supervision/Assistance  ?Patient  can return home with the following Direct supervision/assist for financial management;A lot of help with walking and/or transfers;Direct supervision/assist for medications management;A lot of help with bathing/dressing/bathroom ?  ?Equipment Recommendations ? Other (comment) (TBD at next level of care)  ?  ?Recommendations for Other Services   ? ? ?  ?Precautions / Restrictions Precautions ?Precautions: Fall ?Restrictions ?Weight Bearing Restrictions: No  ?  ? ?Mobility ? Bed Mobility ?Overal bed mobility: Needs Assistance ?Bed Mobility: Supine to Sit ?  ?  ?Supine to sit: Supervision ?  ?  ?  ?Patient Response: Cooperative ? ?Transfers ?Overall transfer level: Needs assistance ?Equipment used: Rolling walker (2 wheels) ?Transfers: Sit to/from Stand (x2) ?Sit to Stand: Min guard ?  ?  ?  ?  ?  ?General transfer comment: vcs for technique ?  ? ?Ambulation/Gait ?Ambulation/Gait assistance: Min guard ?Gait Distance (Feet): 5 Feet (From EOB to recliner) ?Assistive device: Rolling walker (2 wheels) ?Gait Pattern/deviations: Step-through pattern, Decreased step length - right, Decreased step length - left, Decreased stride length, Trunk flexed, Narrow base of support ?Gait velocity: decreased ?  ?  ?  ? ? ?Stairs ?  ?  ?  ?  ?  ? ? ?Wheelchair Mobility ?  ? ?Modified Rankin (Stroke Patients Only) ?  ? ? ?  ?Balance Overall balance assessment: Needs assistance ?Sitting-balance support: Bilateral upper extremity supported ?Sitting balance-Leahy Scale: Fair ?Sitting balance - Comments: able to maintain good sitting balance with BUE support and feet supported, no posterior lean noted ?  ?Standing balance support: Reliant on assistive device for balance, During functional activity ?Standing balance-Leahy Scale: Fair ?Standing balance comment: required Max A for pericare with BUE support from  RW ?  ?  ?  ?  ?  ?  ?  ?  ?  ?  ?  ?  ? ?  ?Cognition Arousal/Alertness: Awake/alert ?Behavior During Therapy: Flat affect ?Overall  Cognitive Status: Impaired/Different from baseline ?  ?  ?  ?  ?  ?  ?  ?  ?  ?  ?Current Attention Level: Sustained ?Memory: Decreased recall of precautions, Decreased short-term memory ?Following Commands: Follows one step commands consistently, Follows multi-step commands inconsistently ?  ?  ?  ?General Comments: pt has baseline dementia at baseline, patient was oriented to self, location, and month, however throughout session patient would ask simple questions such as: how long have I been here, what town are we in, and what the date is. Patient is very pleasant ?  ?  ? ?  ?Exercises General Exercises - Lower Extremity ?Straight Leg Raises: Supine, 10 reps, AROM, Both ?Other Exercises ?Other Exercises: x10 supine glute bridges ? ?  ?General Comments General comments (skin integrity, edema, etc.): HR: 76-88bpm, SpO2 remained >90% throughout session ?  ?  ? ?Pertinent Vitals/Pain Pain Assessment ?Pain Assessment: 0-10 ?Pain Score: 8  ?Faces Pain Scale: Hurts a little bit ?Pain Location: generalized ?Pain Descriptors / Indicators: Discomfort ?Pain Intervention(s): Monitored during session, Repositioned  ? ? ?Home Living   ?  ?  ?  ?  ?  ?  ?  ?  ?  ?   ?  ?Prior Function    ?  ?  ?   ? ?PT Goals (current goals can now be found in the care plan section) Acute Rehab PT Goals ?Patient Stated Goal: to get better ?PT Goal Formulation: With patient ?Time For Goal Achievement: 10/21/21 ?Potential to Achieve Goals: Fair ?Progress towards PT goals: Progressing toward goals ? ?  ?Frequency ? ? ? 7X/week ? ? ? ?  ?PT Plan Current plan remains appropriate  ? ? ?Co-evaluation   ?  ?  ?  ?  ? ?  ?AM-PAC PT "6 Clicks" Mobility   ?Outcome Measure ? Help needed turning from your back to your side while in a flat bed without using bedrails?: A Little ?Help needed moving from lying on your back to sitting on the side of a flat bed without using bedrails?: A Little ?Help needed moving to and from a bed to a chair (including a  wheelchair)?: A Little ?Help needed standing up from a chair using your arms (e.g., wheelchair or bedside chair)?: A Little ?Help needed to walk in hospital room?: A Little ?Help needed climbing 3-5 steps with a railing? : A Lot ?6 Click Score: 17 ? ?  ?End of Session Equipment Utilized During Treatment: Gait belt ?Activity Tolerance: Patient tolerated treatment well ?Patient left: in chair;with call bell/phone within reach ?Nurse Communication: Mobility status ?PT Visit Diagnosis: Unsteadiness on feet (R26.81);History of falling (Z91.81);Other abnormalities of gait and mobility (R26.89);Other symptoms and signs involving the nervous system (R29.898) ?  ? ? ?Time: 3254-9826 ?PT Time Calculation (min) (ACUTE ONLY): 23 min ? ?Charges:  $Therapeutic Exercise: 8-22 mins ?$Therapeutic Activity: 8-22 mins          ?          ? ?Iva Boop, PT  ?10/08/21. 9:27 AM ? ? ?

## 2021-10-08 NOTE — Progress Notes (Signed)
PHARMACY NOTE:  ANTIMICROBIAL RENAL DOSAGE ADJUSTMENT ? ?Current antimicrobial regimen includes a mismatch between antimicrobial dosage and estimated renal function.  As per policy approved by the Pharmacy & Therapeutics and Medical Executive Committees, the antimicrobial dosage will be adjusted accordingly. ? ?Current antimicrobial dosage:  Unasyn 3 g IV Q12H ? ?Indication: aspiration PNA ? ?Renal Function: ? ?Estimated Creatinine Clearance: 41.8 mL/min (by C-G formula based on SCr of 0.68 mg/dL). ?[]      On intermittent HD, scheduled: ?[]      On CRRT ?   ?Antimicrobial dosage has been changed to:  Unasyn 3 g IV Q6H ? ?Additional comments: ? ? ?Thank you for allowing pharmacy to be a part of this patient's care. ? ? , [facility] ?10/08/2021 7:34 AM ?

## 2021-10-08 NOTE — [place] FL2 (Signed)
?[place] MEDICAID FL2 LEVEL OF CARE SCREENING TOOL  ?  ? ?IDENTIFICATION  ?Patient Name: ?Gabriella Rogers Birthdate: 1933/09/04 Sex: female Admission Date (Current Location): ?10/06/2021  ?[place] Number: ? [place] ?  Facility and Address:  ?[facility], [address] [place], [place] [address] ?     Provider Number: ?[id number]  ?Attending Physician Name and Address:  ?Dorcas Carrow, MD ? Relative Name and Phone Number:  ?Shon Hough Daughter     [phone] ?   ?Current Level of Care: ?Hospital Recommended Level of Care: ?Skilled Nursing Facility Prior Approval Number: ?  ? ?Date Approved/Denied: ?  PASRR Number: ?[id number] ? ?Discharge Plan: ?SNF ?  ? ?Current Diagnoses: ?Patient Active Problem List  ? Diagnosis Date Noted  ? AKI (acute kidney injury) (HCC) 10/07/2021  ? Hypokalemia 10/07/2021  ? Macrocytic anemia 10/07/2021  ? Leukocytosis 10/07/2021  ? Generalized weakness 10/07/2021  ? Stroke (cerebrum) (HCC) 10/07/2021  ? Urinary tract infection without hematuria 04/13/2021  ? Acute cystitis without hematuria 04/13/2021  ? Urethritis 04/13/2021  ? Painful urination 01/07/2021  ? Wax in ear 02/26/2020  ? Acute CVA (cerebrovascular accident) (HCC) 01/26/2020  ? Mild cognitive impairment 01/26/2020  ? Depression 11/26/2015  ? Dizziness 11/26/2015  ? Hyperlipidemia, unspecified 11/26/2015  ? Headache, migraine 11/26/2015  ? Osteoporosis, post-menopausal 11/26/2015  ? H/O total knee replacement 11/23/2015  ? Arthropathy, traumatic, knee 03/05/2015  ? Brain disorder 03/27/2014  ? Cervical dystonia 01/24/2012  ? ? ?Orientation RESPIRATION BLADDER Height & Weight   ?  ?Self ? Normal (97% room air) Incontinent Weight: 54.5 kg ?Height:     ?BEHAVIORAL SYMPTOMS/MOOD NEUROLOGICAL BOWEL NUTRITION STATUS  ?    Incontinent Diet (Regular)  ?AMBULATORY STATUS COMMUNICATION OF NEEDS Skin   ?Extensive Assist Verbally Normal ?  ?  ?  ?    ?     ?     ? ? ?Personal Care Assistance  Level of Assistance  ?Bathing, Dressing, Feeding, Total care Bathing Assistance: Maximum assistance ?Feeding assistance: Maximum assistance ?Dressing Assistance: Maximum assistance ?   ? ?Functional Limitations Info  ?Sight, Hearing, Speech Sight Info: Impaired ?Hearing Info: Impaired ?Speech Info: Adequate  ? ? ?SPECIAL CARE FACTORS FREQUENCY  ?PT (By licensed PT), OT (By licensed OT)   ?  ?PT Frequency: min 5x weekly ?OT Frequency: Min 5x weekly ?  ?  ?  ?   ? ? ?Contractures Contractures Info: Not present  ? ? ?Additional Factors Info  ?Allergies, Code Status Code Status Info: FULL CODE ?Allergies Info: Actonel  (risedronate Sodium)     Pneumococcal Vaccine   Risedronate   Alendronate Low  Rash   Aleve (naproxen Sodium)    Pravastatin ?  ?  ?  ?   ? ?Current Medications (10/08/2021):  This is the current hospital active medication list ?Current Facility-Administered Medications  ?Medication Dose Route Frequency Provider Last Rate Last Admin  ?  stroke: early stages of recovery book   Does not apply Once Andris Baumann, MD      ? acetaminophen (TYLENOL) tablet 650 mg  650 mg Oral Q4H PRN Andris Baumann, MD   650 mg at 10/07/21 1319  ? Or  ? acetaminophen (TYLENOL) 160 MG/5ML solution 650 mg  650 mg Per Tube Q4H PRN Andris Baumann, MD      ? Or  ? acetaminophen (TYLENOL) suppository 650 mg  650 mg Rectal Q4H PRN Andris Baumann, MD      ?  aspirin EC tablet 81 mg  81 mg Oral Daily Lindajo Royal V, MD   81 mg at 10/08/21 1115  ? atorvastatin (LIPITOR) tablet 80 mg  80 mg Oral q1800 Andris Baumann, MD   80 mg at 10/07/21 1819  ? clonazePAM (KLONOPIN) tablet 0.5 mg  0.5 mg Oral BID PRN Andris Baumann, MD   0.5 mg at 10/08/21 0034  ? clopidogrel (PLAVIX) tablet 75 mg  75 mg Oral Daily Lindajo Royal V, MD   75 mg at 10/08/21 1115  ? cyanocobalamin ((VITAMIN B-12)) injection 1,000 mcg  1,000 mcg Intramuscular Once Dorcas Carrow, MD      ? donepezil (ARICEPT) tablet 10 mg  10 mg Oral QHS Andris Baumann, MD   10 mg at  10/07/21 2208  ? enoxaparin (LOVENOX) injection 40 mg  40 mg Subcutaneous Q24H Rauer, Robyne Peers, RPH   40 mg at 10/08/21 1115  ? famotidine (PEPCID) tablet 20 mg  20 mg Oral Daily Dorcas Carrow, MD      ? PARoxetine (PAXIL) tablet 10 mg  10 mg Oral Daily Ghimire, Lyndel Safe, MD      ? potassium chloride SA (KLOR-CON M) CR tablet 40 mEq  40 mEq Oral BID Dorcas Carrow, MD   40 mEq at 10/08/21 1114  ? [START ON 10/09/2021] vitamin B-12 (CYANOCOBALAMIN) tablet 1,000 mcg  1,000 mcg Oral Daily Dorcas Carrow, MD      ? ? ? ?Discharge Medications: ?Please see discharge summary for a list of discharge medications. ? ?Relevant Imaging Results: ? ?Relevant Lab Results: ? ? ?Additional Information ?SSN [id number] ? ?Caryn Section, RN ? ? ? ? ?

## 2021-10-08 NOTE — Progress Notes (Signed)
Occupational Therapy Treatment ?Patient Details ?Name: Gabriella Rogers ?MRN: [id number] ?DOB: 1933/08/26 ?Today's Date: 10/08/2021 ? ? ?History of present illness Pt is an 88 year old female admitted with small acute ischemic stroke right thalamus and right central semiovale. PMH significant for mild cognitive impairment from home, prior stroke and 2021 on aspirin currently, hyperlipidemia brought to ER with weakness and difficulty getting her words out ?  ?OT comments ? Chart reviewed to date, RN cleared pt for participation in OT tx session.Tx session targeted improving tolerance and participation in ADL tasks to facilitate return to PLOF. Pt with improved cognition on this date and is alert and oriented x4. Pt performs supine>sit with supervision with HOB raised, SPT to bedside commode with supervision-CGA with RW, MAX A required for peri care. Pt amb throughout room approx 20 feet with supervision-CGA with RW with intermittent vcs for pacing and appropriate RW use. Grooming completed sink level with supervision. Pt with significantly improved performance as compared to evaluation however continues to perform ADL/functional mobility below PLOF. Pt was MOD I in ADL/IADL with intermittent assist from husband PTA. Husband is available to assist 24/7. At this time, discharge recommendations have been updated. Recommend Acute Rehab to address functional deficits. Pt is left in beside chair, NAD, all needs met +chair alarm. OT will continue to follow acutely.   ? ?Recommendations for follow up therapy are one component of a multi-disciplinary discharge planning process, led by the attending physician.  Recommendations may be updated based on patient status, additional functional criteria and insurance authorization. ?   ?Follow Up Recommendations ? Acute inpatient rehab (3hours/day)  ?  ?Assistance Recommended at Discharge Frequent or constant Supervision/Assistance  ?Patient can return home with the following ? A little  help with walking and/or transfers;A little help with bathing/dressing/bathroom;Assistance with cooking/housework;Direct supervision/assist for financial management;Assist for transportation;Direct supervision/assist for medications management ?  ?Equipment Recommendations ? None recommended by OT  ?  ?Recommendations for Other Services   ? ?  ?Precautions / Restrictions Precautions ?Precautions: Fall ?Restrictions ?Weight Bearing Restrictions: No  ? ? ?  ? ?Mobility Bed Mobility ?Overal bed mobility: Needs Assistance ?Bed Mobility: Supine to Sit ?  ?  ?Supine to sit: Supervision, HOB elevated ?  ?  ?  ?  ? ?Transfers ?Overall transfer level: Needs assistance ?Equipment used: Rolling walker (2 wheels) ?Transfers: Sit to/from Stand ?Sit to Stand: Supervision, Min guard ?  ?  ?  ?  ?  ?  ?  ?  ?Balance Overall balance assessment: Needs assistance ?Sitting-balance support: Feet supported ?Sitting balance-Leahy Scale: Fair ?  ?  ?Standing balance support: Reliant on assistive device for balance, During functional activity ?Standing balance-Leahy Scale: Fair ?  ?  ?  ?  ?  ?  ?  ?  ?  ?  ?  ?  ?   ? ?ADL either performed or assessed with clinical judgement  ? ?ADL Overall ADL's : Needs assistance/impaired ?Eating/Feeding: Set up ?  ?Grooming: Wash/dry hands;Wash/dry face;Standing ?Grooming Details (indicate cue type and reason): sink level with RW ?  ?  ?  ?  ?Upper Body Dressing : Maximal assistance;Sitting ?  ?  ?  ?Toilet Transfer: Min guard;BSC/3in1;Stand-pivot;Rolling walker (2 wheels) ?Toilet Transfer Details (indicate cue type and reason): vcs for safe RW use ?Toileting- Clothing Manipulation and Hygiene: Maximal assistance;Sit to/from stand ?Toileting - Clothing Manipulation Details (indicate cue type and reason): peri care after BM ?  ?  ?Functional mobility during ADLs: Supervision/safety;Min guard;Rolling  walker (2 wheels) ?General ADL Comments: intermittent vcs throughout safety, pacing; amb approx 20'  throughout room with supervision-CGA with RW ?  ? ?Extremity/Trunk Assessment   ?  ?  ?  ?  ?  ? ?Vision   ?  ?  ?Perception   ?  ?Praxis   ?  ? ?Cognition Arousal/Alertness: Awake/alert ?Behavior During Therapy: [facility] for tasks assessed/performed ?Overall Cognitive Status: Impaired/Different from baseline ?Area of Impairment: Safety/judgement ?  ?  ?  ?  ?  ?  ?  ?  ?  ?Current Attention Level: Sustained ?  ?Following Commands: Follows one step commands consistently, Follows multi-step commands inconsistently ?Safety/Judgement: Decreased awareness of safety ?  ?  ?General Comments: Pt presents with baseline dementia (however completes ADL with MOD I at baseline level); Much improved mentation as compared to evaluation. Pt is alert and oriented x4 presents with deficits as noted above. Fair recall for home set up. ?  ?  ?   ?Exercises   ? ?  ?Shoulder Instructions   ? ? ?  ?General Comments    ? ? ?Pertinent Vitals/ Pain       Pain Assessment ?Pain Assessment: No/denies pain ? ?Home Living   ?  ?  ?  ?  ?  ?  ?  ?  ?  ?  ?  ?  ?  ?  ?  ?  ?  ?  ? ?  ?Prior Functioning/Environment    ?  ?  ?  ?   ? ?Frequency ? Min 3X/week  ? ? ? ? ?  ?Progress Toward Goals ? ?OT Goals(current goals can now be found in the care plan section) ? Progress towards OT goals: Progressing toward goals ? ?Acute Rehab OT Goals ?Patient Stated Goal: feel better ?OT Goal Formulation: With patient/family ?Time For Goal Achievement: 10/22/21 ?Potential to Achieve Goals: Good  ?Plan Discharge plan needs to be updated   ? ?Co-evaluation ? ? ?   ?  ?  ?  ?  ? ?  ?AM-PAC OT "6 Clicks" Daily Activity     ?Outcome Measure ? ? Help from another person eating meals?: None ?Help from another person taking care of personal grooming?: None ?Help from another person toileting, which includes using toliet, bedpan, or urinal?: A Lot ?Help from another person bathing (including washing, rinsing, drying)?: A Lot ?Help from another person to put on and taking off  regular upper body clothing?: A Little ?Help from another person to put on and taking off regular lower body clothing?: A Lot ?6 Click Score: 17 ? ?  ?End of Session Equipment Utilized During Treatment: Gait belt;Rolling walker (2 wheels) ? ?OT Visit Diagnosis: Other symptoms and signs involving the nervous system (R29.898);Muscle weakness (generalized) (M62.81);Unsteadiness on feet (R26.81) ?  ?Activity Tolerance Patient tolerated treatment well ?  ?Patient Left in chair;with call bell/phone within reach;with chair alarm set ?  ?Nurse Communication Mobility status ?  ? ?   ? ?Time: 5038-8828 ?OT Time Calculation (min): 34 min ? ?Charges: OT General Charges ?$OT Visit: 1 Visit ?OT Treatments ?$Self Care/Home Management : 23-37 mins ? ?Shanon Payor, OTD OTR/L  ?10/08/21, 3:16 PM  ?

## 2021-10-08 NOTE — Care Management Important Message (Signed)
Important Message ? ?Patient Details  ?Name: Gabriella Rogers ?MRN: [id number] ?Date of Birth: 1934-04-12 ? ? ?Medicare Important Message Given:  N/A - LOS <3 / Initial given by admissions ? ? ? ? ?Johnell Comings ?10/08/2021, 4:41 PM ?

## 2021-10-09 DIAGNOSIS — I639 Cerebral infarction, unspecified: Secondary | ICD-10-CM | POA: Diagnosis not present

## 2021-10-09 NOTE — Progress Notes (Signed)
?PROGRESS NOTE ? ? ? ?Gabriella Rogers  OVF:[id number] DOB: July 24, 1933 DOA: 10/06/2021 ?PCP: Corky Downs, MD  ? ? ?Brief Narrative:  ?88 year old with history of mild cognitive impairment from home, prior stroke and 2021 on aspirin currently, hyperlipidemia brought to ER with weakness and difficulty getting her words out.  In the emergency room hemodynamically stable.  No focal neurological deficit.  Patient was found to have a small acute ischemic stroke right thalamus and right central semiovale.  She was also found with febrile episode. ? ? ?Assessment & Plan: ?  ?Febrile episode: Temperature 103 with tachycardia. ?Chest x-ray with no obvious pneumonia, COVID-19 and flu negative.  Urinalysis fairly normal.  Upper airway sounds noted after patient was given medications.  ?Suspect aspiration pneumonitis, lactic acid and procalcitonin normal.  Given high temperature, blood cultures were drawn and patient was started on Unasyn.  Afebrile last 48 hours .  Monitored off antibiotics with no issues.  ?Seen by speech therapy, found to have no obvious aspiration. ?  ?Acute ischemic stroke: ?Neurochecks and vital signs as per stroke protocol. ?Patient was not a TPA and vascular intervention candidate because of very minor symptoms. ?Diet, seen by speech.  She was able to eat. ?Antiplatelets, on aspirin at home.  Loaded with aspirin and Plavix.  Recommended indefinite aspirin and Plavix. ?Statin, LDL 108.  On atorvastatin 80 mg at home, will resume. ?Blood pressure goals, normotensive.  At goal. ?Consultations, neurology, speech, PT OT.  Following. ?MRI of the brain as above. ?CT angiogram head and neck, no large vessel occlusion. ?2D echocardiogram with normal ejection fraction, no evidence of intracardiac thrombus. ? ?Acute kidney injury: Improved and normalized. ? ?Mild cognitive impairment and cervical dystonia: On donepezil.  Received Botox with neurology at [facility].  She is also on clonazepam as needed. ? ?Vitamin B12  deficiency: Screening test for vitamin B12 on admission with serum level of 159.  We will give her injectable B12 while in the hospital and discharge on oral B12. ? ?DVT prophylaxis: enoxaparin (LOVENOX) injection 40 mg Start: 10/08/21 1020 ? ? ?Code Status: Full code ?Family Communication: Daughter on the phone 4/20 ?Disposition Plan: Status is: Inpatient ?Remains inpatient appropriate because: Medically stable to transfer to rehab when bed available. ?  ? ? ?Consultants:  ?Neurology ? ?Procedures:  ?None ? ?Antimicrobials:  ?Unasyn 4/19-4/20 ? ? ?Subjective: ?Patient seen and examined.  She feels weak and fatigued.  Denies any other complaints.  No other overnight events.  Afebrile. ? ?Objective: ?Vitals:  ? 10/08/21 2046 10/09/21 0029 10/09/21 8416 10/09/21 0746  ?BP: (!) 152/68 (!) 190/75 (!) 181/83 (!) 176/85  ?Pulse: 75 76 76 77  ?Resp: 18 17 19 16   ?Temp: 98.7 ?F (37.1 ?C) 98 ?F (36.7 ?C) 98.1 ?F (36.7 ?C) 98.2 ?F (36.8 ?C)  ?TempSrc:    Oral  ?SpO2: 99% 98%  100%  ?Weight:      ? ?No intake or output data in the 24 hours ending 10/09/21 1054 ? ?Filed Weights  ? 10/06/21 2335  ?Weight: 54.5 kg  ? ? ?Examination: ? ?General: Looks fairly comfortable. Frail and debilitated.  Alert oriented x2-3.  Flat affect.  Slightly anxious today. ?Cardiovascular: S1-S2 normal.  Regular rate rhythm. ?Respiratory: Bilateral clear.  No added sounds. ?Gastrointestinal: Soft.  Nontender. ?Ext: No swelling edema or cyanosis. ?Neuro: Flat affect.  No obvious neurological deficit. ?Musculoskeletal: No deformities. ?Skin: Intact. ? ? ? ? ? ?Data Reviewed: I have personally reviewed following labs and imaging studies ? ?CBC: ?  Recent Labs  ?Lab 10/07/21 ?0006 10/07/21 ?1805  ?WBC 12.2* 12.0*  ?NEUTROABS 10.7* 10.3*  ?HGB 11.9* 13.5  ?HCT 35.7* 40.8  ?MCV 101.7* 103.6*  ?PLT 305 263  ? ? ?Basic Metabolic Panel: ?Recent Labs  ?Lab 10/07/21 ?0006 10/08/21 ?0501  ?NA 137 138  ?K 3.0* 3.9  ?CL 102 110  ?CO2 26 24  ?GLUCOSE 120* 94  ?BUN 19  11  ?CREATININE 1.21* 0.68  ?CALCIUM 8.7* 8.3*  ? ? ?GFR: ?Estimated Creatinine Clearance: 41.8 mL/min (by C-G formula based on SCr of 0.68 mg/dL). ?Liver Function Tests: ?Recent Labs  ?Lab 10/07/21 ?0006  ?AST 36  ?ALT 14  ?ALKPHOS 86  ?BILITOT 1.1  ?PROT 7.6  ?ALBUMIN 3.6  ? ? ?No results for input(s): LIPASE, AMYLASE in the last 168 hours. ?No results for input(s): AMMONIA in the last 168 hours. ?Coagulation Profile: ?Recent Labs  ?Lab 10/07/21 ?0006  ?INR 1.1  ? ? ?Cardiac Enzymes: ?No results for input(s): CKTOTAL, CKMB, CKMBINDEX, TROPONINI in the last 168 hours. ?BNP (last 3 results) ?No results for input(s): PROBNP in the last 8760 hours. ?HbA1C: ?Recent Labs  ?  10/07/21 ?0006  ?HGBA1C 5.5  ? ? ?CBG: ?Recent Labs  ?Lab 10/06/21 ?2335  ?GLUCAP 131*  ? ? ?Lipid Profile: ?Recent Labs  ?  10/07/21 ?0006  ?CHOL 193  ?HDL 71  ?LDLCALC 108*  ?TRIG 68  ?CHOLHDL 2.7  ? ? ?Thyroid Function Tests: ?No results for input(s): TSH, T4TOTAL, FREET4, T3FREE, THYROIDAB in the last 72 hours. ?Anemia Panel: ?Recent Labs  ?  10/06/21 ?2345 10/07/21 ?0006  ?MVHQIONG29 159*  --   ?FOLATE  --  16.6  ?FERRITIN  --  35  ?TIBC  --  332  ?IRON  --  19*  ? ? ?Sepsis Labs: ?Recent Labs  ?Lab 10/07/21 ?0718 10/08/21 ?0501  ?PROCALCITON 0.12 0.75  ?LATICACIDVEN 0.9  --   ? ? ? ?Recent Results (from the past 240 hour(s))  ?Resp Panel by RT-PCR (Flu A&B, Covid) Nasopharyngeal Swab     Status: None  ? Collection Time: 10/07/21 12:58 AM  ? Specimen: Nasopharyngeal Swab; Nasopharyngeal(NP) swabs in vial transport medium  ?Result Value Ref Range Status  ? SARS Coronavirus 2 by RT PCR NEGATIVE NEGATIVE Final  ?  Comment: (NOTE) ?SARS-CoV-2 target nucleic acids are NOT DETECTED. ? ?The SARS-CoV-2 RNA is generally detectable in upper respiratory ?specimens during the acute phase of infection. The lowest ?concentration of SARS-CoV-2 viral copies this assay can detect is ?138 copies/mL. A negative result does not preclude SARS-Cov-2 ?infection and  should not be used as the sole basis for treatment or ?other patient management decisions. A negative result may occur with  ?improper specimen collection/handling, submission of specimen other ?than nasopharyngeal swab, presence of viral mutation(s) within the ?areas targeted by this assay, and inadequate number of viral ?copies(<138 copies/mL). A negative result must be combined with ?clinical observations, patient history, and epidemiological ?information. The expected result is Negative. ? ?Fact Sheet for Patients:  ?BloggerCourse.com ? ?Fact Sheet for Healthcare Providers:  ?SeriousBroker.it ? ?This test is no t yet approved or cleared by the [place] FDA and  ?has been authorized for detection and/or diagnosis of SARS-CoV-2 by ?FDA under an Emergency Use Authorization (EUA). This EUA will remain  ?in effect (meaning this test can be used) for the duration of the ?COVID-19 declaration under Section 564(b)(1) of the Act, 21 ?U.S.C.section 360bbb-3(b)(1), unless the authorization is terminated  ?or revoked sooner.  ? ? ?  ?  Influenza A by PCR NEGATIVE NEGATIVE Final  ? Influenza B by PCR NEGATIVE NEGATIVE Final  ?  Comment: (NOTE) ?The Xpert Xpress SARS-CoV-2/FLU/RSV plus assay is intended as an aid ?in the diagnosis of influenza from Nasopharyngeal swab specimens and ?should not be used as a sole basis for treatment. Nasal washings and ?aspirates are unacceptable for Xpert Xpress SARS-CoV-2/FLU/RSV ?testing. ? ?Fact Sheet for Patients: ?BloggerCourse.comhttps://www.fda.gov/media/152166/download ? ?Fact Sheet for Healthcare Providers: ?SeriousBroker.ithttps://www.fda.gov/media/152162/download ? ?This test is not yet approved or cleared by the [place] FDA and ?has been authorized for detection and/or diagnosis of SARS-CoV-2 by ?FDA under an Emergency Use Authorization (EUA). This EUA will remain ?in effect (meaning this test can be used) for the duration of the ?COVID-19 declaration  under Section 564(b)(1) of the Act, 21 U.S.C. ?section 360bbb-3(b)(1), unless the authorization is terminated or ?revoked. ? ?Performed at [facility], [address] [place], ?[place] [id number] ?

## 2021-10-09 NOTE — Progress Notes (Signed)
CTA of head and neck with no emergent finding. There is 70% atheromatous stenosis at the proximal right subclavian artery. Bilateral fetal type PCA. No significant stenosis or embolic ?source seen in the anterior circulation. ? ?A/R: 88 y.o. female with small acute strokes within the ventral right thalamus and right centrum semiovale. ?- Continue DAPT with ASA and Plavix. Continue indefinitely as she has failed ASA monotherapy.  ?- Will need outpatient Neurology follow up.  ?- Neurohospitalist service will sign off. Please call if there are additional questions.  ? ?Electronically signed: Dr. Caryl Pina ? ?

## 2021-10-09 NOTE — [place] FL2 (Signed)
?[place] MEDICAID FL2 LEVEL OF CARE SCREENING TOOL  ?  ? ?IDENTIFICATION  ?Patient Name: ?Gabriella Rogers Birthdate: 05/28/1934 Sex: female Admission Date (Current Location): ?10/06/2021  ?[place] Number: ? [place] ?  Facility and Address:  ?[facility], [address] [place], [place] [address] ?     Provider Number: ?[id number]  ?Attending Physician Name and Address:  ?Dorcas Carrow, MD ? Relative Name and Phone Number:  ?Shon Hough Daughter     [phone] ?   ?Current Level of Care: ?Hospital Recommended Level of Care: ?Skilled Nursing Facility Prior Approval Number: ?  ? ?Date Approved/Denied: ?  PASRR Number: ?[id number] A ? ?Discharge Plan: ?SNF ?  ? ?Current Diagnoses: ?Patient Active Problem List  ? Diagnosis Date Noted  ? AKI (acute kidney injury) (HCC) 10/07/2021  ? Hypokalemia 10/07/2021  ? Macrocytic anemia 10/07/2021  ? Leukocytosis 10/07/2021  ? Generalized weakness 10/07/2021  ? Stroke (cerebrum) (HCC) 10/07/2021  ? Urinary tract infection without hematuria 04/13/2021  ? Acute cystitis without hematuria 04/13/2021  ? Urethritis 04/13/2021  ? Painful urination 01/07/2021  ? Wax in ear 02/26/2020  ? Acute CVA (cerebrovascular accident) (HCC) 01/26/2020  ? Mild cognitive impairment 01/26/2020  ? Depression 11/26/2015  ? Dizziness 11/26/2015  ? Hyperlipidemia, unspecified 11/26/2015  ? Headache, migraine 11/26/2015  ? Osteoporosis, post-menopausal 11/26/2015  ? H/O total knee replacement 11/23/2015  ? Arthropathy, traumatic, knee 03/05/2015  ? Brain disorder 03/27/2014  ? Cervical dystonia 01/24/2012  ? ? ?Orientation RESPIRATION BLADDER Height & Weight   ?  ?Self ? Normal (97% room air) Incontinent Weight: 54.5 kg ?Height:     ?BEHAVIORAL SYMPTOMS/MOOD NEUROLOGICAL BOWEL NUTRITION STATUS  ?    Incontinent Diet (Regular)  ?AMBULATORY STATUS COMMUNICATION OF NEEDS Skin   ?Limited Assist Verbally Normal ?  ?  ?  ?    ?     ?     ? ? ?Personal Care Assistance Level  of Assistance  ?Bathing, Dressing, Feeding, Total care Bathing Assistance: Maximum assistance ?Feeding assistance: Limited assistance ?Dressing Assistance: Maximum assistance ?   ? ?Functional Limitations Info  ?Sight, Hearing, Speech Sight Info: Impaired ?Hearing Info: Impaired ?Speech Info: Adequate  ? ? ?SPECIAL CARE FACTORS FREQUENCY  ?PT (By licensed PT), OT (By licensed OT)   ?  ?PT Frequency: min 5x weekly ?OT Frequency: Min 5x weekly ?  ?  ?  ?   ? ? ?Contractures Contractures Info: Not present  ? ? ?Additional Factors Info  ?Allergies, Code Status Code Status Info: FULL CODE ?Allergies Info: Actonel  (risedronate Sodium)     Pneumococcal Vaccine   Risedronate   Alendronate Low  Rash   Aleve (naproxen Sodium)    Pravastatin ?  ?  ?  ?   ? ?Current Medications (10/09/2021):  This is the current hospital active medication list ?Current Facility-Administered Medications  ?Medication Dose Route Frequency Provider Last Rate Last Admin  ?  stroke: early stages of recovery book   Does not apply Once Andris Baumann, MD      ? acetaminophen (TYLENOL) tablet 650 mg  650 mg Oral Q4H PRN Andris Baumann, MD   650 mg at 10/07/21 1319  ? Or  ? acetaminophen (TYLENOL) 160 MG/5ML solution 650 mg  650 mg Per Tube Q4H PRN Andris Baumann, MD      ? Or  ? acetaminophen (TYLENOL) suppository 650 mg  650 mg Rectal Q4H PRN Andris Baumann, MD      ?  aspirin EC tablet 81 mg  81 mg Oral Daily Andris Baumann, MD   81 mg at 10/09/21 0944  ? atorvastatin (LIPITOR) tablet 80 mg  80 mg Oral q1800 Andris Baumann, MD   80 mg at 10/08/21 2020  ? clonazePAM (KLONOPIN) tablet 0.5 mg  0.5 mg Oral BID PRN Andris Baumann, MD   0.5 mg at 10/08/21 2020  ? clopidogrel (PLAVIX) tablet 75 mg  75 mg Oral Daily Andris Baumann, MD   75 mg at 10/09/21 0943  ? donepezil (ARICEPT) tablet 10 mg  10 mg Oral QHS Andris Baumann, MD   10 mg at 10/08/21 2019  ? enoxaparin (LOVENOX) injection 40 mg  40 mg Subcutaneous Q24H Rauer, Samantha O, RPH   40 mg at  10/09/21 1126  ? famotidine (PEPCID) tablet 20 mg  20 mg Oral Daily Dorcas Carrow, MD   20 mg at 10/09/21 0944  ? PARoxetine (PAXIL) tablet 10 mg  10 mg Oral Daily Dorcas Carrow, MD   10 mg at 10/09/21 0944  ? vitamin B-12 (CYANOCOBALAMIN) tablet 1,000 mcg  1,000 mcg Oral Daily Dorcas Carrow, MD   1,000 mcg at 10/09/21 2542  ? ? ? ?Discharge Medications: ?Please see discharge summary for a list of discharge medications. ? ?Relevant Imaging Results: ? ?Relevant Lab Results: ? ? ?Additional Information ?SSN [id number] ? ?Caryn Section, RN ? ? ? ? ?

## 2021-10-09 NOTE — Plan of Care (Signed)
?  Problem: Clinical Measurements: ?Goal: Ability to maintain clinical measurements within normal limits will improve ?Outcome: Progressing ?Goal: Will remain free from infection ?Outcome: Progressing ?Goal: Respiratory complications will improve ?Outcome: Progressing ?Goal: Cardiovascular complication will be avoided ?Outcome: Progressing ?  ?Problem: Nutrition: ?Goal: Adequate nutrition will be maintained ?Outcome: Progressing ?  ?Problem: Coping: ?Goal: Level of anxiety will decrease ?Outcome: Progressing ?  ?Problem: Elimination: ?Goal: Will not experience complications related to bowel motility ?Outcome: Progressing ?  ?Problem: Pain Managment: ?Goal: General experience of comfort will improve ?Outcome: Progressing ?  ?Problem: Safety: ?Goal: Ability to remain free from injury will improve ?Outcome: Progressing ?  ?Problem: Skin Integrity: ?Goal: Risk for impaired skin integrity will decrease ?Outcome: Progressing ?  ?

## 2021-10-09 NOTE — Plan of Care (Signed)
?  Problem: Clinical Measurements: ?Goal: Ability to maintain clinical measurements within normal limits will improve ?Outcome: Progressing ?Goal: Will remain free from infection ?Outcome: Progressing ?Goal: Respiratory complications will improve ?Outcome: Progressing ?Goal: Cardiovascular complication will be avoided ?Outcome: Progressing ?  ?Problem: Nutrition: ?Goal: Adequate nutrition will be maintained ?Outcome: Progressing ?  ?Problem: Coping: ?Goal: Level of anxiety will decrease ?Outcome: Progressing ?  ?Problem: Elimination: ?Goal: Will not experience complications related to bowel motility ?Outcome: Progressing ?  ?

## 2021-10-09 NOTE — TOC Progression Note (Addendum)
Transition of Care (TOC) - Progression Note  ? ? ?Patient Details  ?Name: Gabriella Rogers ?MRN: [id number] ?Date of Birth: 14-Feb-1934 ? ?Transition of Care (TOC) CM/SW Contact  ?Pete Pelt, RN ?Phone Number: ?10/09/2021, 11:31 AM ? ?Clinical Narrative:   CIR investigated and is not taking patient.  RNCM will arrange disposition ? ?SNF bed search sent due to inability of CIR to take patient.  Will await results. ? ?Expected Discharge Plan: [place] ?Barriers to Discharge: Continued Medical Work up ? ?Expected Discharge Plan and Services ?Expected Discharge Plan: [place] ?  ?Discharge Planning Services: CM Consult ?  ?Living arrangements for the past 2 months: [place] ?                ?DME Arranged: N/A ?DME Agency: NA ?  ?  ?  ?  ?  ?  ?  ?  ? ? ?Social Determinants of Health (SDOH) Interventions ?  ? ?Readmission Risk Interventions ?   ? View : No data to display.  ?  ?  ?  ? ? ?

## 2021-10-09 NOTE — Progress Notes (Signed)
Occupational Therapy Treatment ?Patient Details ?Name: Gabriella Rogers ?MRN: [id number] ?DOB: 1933/10/05 ?Today's Date: 10/09/2021 ? ? ?History of present illness Pt is an 88 year old female admitted with small acute ischemic stroke right thalamus and right central semiovale. PMH significant for mild cognitive impairment from home, prior stroke and 2021 on aspirin currently, hyperlipidemia brought to ER with weakness and difficulty getting her words out ?  ?OT comments ? Chart reviewed, pt greeted in chair agreeable to session. Tx session targeted progressing independence/safety during ADL tasks. Pt noted to be soiled with BM, transfer to [facility] with supervision-CGA. Improved independence during peri care on this date, requiring MIN A. Of note: pt participates in visual assessment with vision appearing WFL, will continue to assess. Pt is making progress towards goals however continues to perform below PLOF. Continue to recommend discharge to STR to address functional deficits. Pt is left as received, NAD, all needs met. OT will continue to follow.   ? ?Recommendations for follow up therapy are one component of a multi-disciplinary discharge planning process, led by the attending physician.  Recommendations may be updated based on patient status, additional functional criteria and insurance authorization. ?   ?Follow Up Recommendations ? Acute inpatient rehab (3hours/day)  ?  ?Assistance Recommended at Discharge Frequent or constant Supervision/Assistance  ?Patient can return home with the following ? A little help with walking and/or transfers;A little help with bathing/dressing/bathroom;Assistance with cooking/housework;Direct supervision/assist for financial management;Assist for transportation;Direct supervision/assist for medications management ?  ?Equipment Recommendations ? None recommended by OT  ?  ?Recommendations for Other Services   ? ?  ?Precautions / Restrictions Precautions ?Precautions:  Fall ?Restrictions ?Weight Bearing Restrictions: No  ? ? ?  ? ?Mobility Bed Mobility ?  ?  ?  ?  ?  ?  ?  ?  ?  ? ?Transfers ?Overall transfer level: Needs assistance ?Equipment used: Rolling walker (2 wheels) ?Transfers: Sit to/from Stand, Bed to chair/wheelchair/BSC ?Sit to Stand: Min guard ?  ?  ?  ?  ?  ?  ?  ?  ?Balance   ?  ?  ?  ?  ?  ?  ?  ?  ?  ?  ?  ?  ?  ?  ?  ?  ?  ?  ?   ? ?ADL either performed or assessed with clinical judgement  ? ?ADL Overall ADL's : Needs assistance/impaired ?Eating/Feeding: Set up ?  ?Grooming: Wash/dry hands;Wash/dry face;Sitting;Set up ?  ?  ?  ?Lower Body Bathing: Min guard;Sit to/from stand ?Lower Body Bathing Details (indicate cue type and reason): with RW ?Upper Body Dressing : Minimal assistance;Sitting ?  ?  ?  ?Toilet Transfer: Min guard;BSC/3in1;Stand-pivot;Rolling walker (2 wheels);Supervision/safety ?  ?Toileting- Clothing Manipulation and Hygiene: Minimal assistance;Sit to/from stand ?Toileting - Clothing Manipulation Details (indicate cue type and reason): peri care after BM with RW ?  ?  ?Functional mobility during ADLs: Supervision/safety;Min guard;Rolling walker (2 wheels) ?General ADL Comments: pt soiled following BM ?  ? ?Extremity/Trunk Assessment   ?  ?  ?  ?  ?  ? ?Vision   ?  ?  ?Perception   ?  ?Praxis   ?  ? ?Cognition Arousal/Alertness: Awake/alert ?Behavior During Therapy: Flat affect ?Overall Cognitive Status: Impaired/Different from baseline ?Area of Impairment: Orientation, Attention ?  ?  ?  ?  ?  ?  ?  ?  ?Orientation Level: Disoriented to, Situation ?Current Attention Level: Sustained ?Memory: Decreased recall of precautions,  Decreased short-term memory ?Following Commands: Follows one step commands consistently, Follows multi-step commands inconsistently ?Safety/Judgement: Decreased awareness of safety ?  ?  ?  ?  ?  ?   ?Exercises   ? ?  ?Shoulder Instructions   ? ? ?  ?General Comments    ? ? ?Pertinent Vitals/ Pain       Pain Assessment ?Pain  Assessment: No/denies pain ? ?Home Living   ?  ?  ?  ?  ?  ?  ?  ?  ?  ?  ?  ?  ?  ?  ?  ?  ?  ?  ? ?  ?Prior Functioning/Environment    ?  ?  ?  ?   ? ?Frequency ? Min 3X/week  ? ? ? ? ?  ?Progress Toward Goals ? ?OT Goals(current goals can now be found in the care plan section) ? Progress towards OT goals: Progressing toward goals ? ?Acute Rehab OT Goals ?Patient Stated Goal: feel better ?OT Goal Formulation: With patient ?Time For Goal Achievement: 10/23/21 ?Potential to Achieve Goals: Good  ?Plan Discharge plan needs to be updated   ? ?Co-evaluation ? ? ?   ?  ?  ?  ?  ? ?  ?AM-PAC OT "6 Clicks" Daily Activity     ?Outcome Measure ? ? Help from another person eating meals?: None ?Help from another person taking care of personal grooming?: None ?Help from another person toileting, which includes using toliet, bedpan, or urinal?: A Little ?Help from another person bathing (including washing, rinsing, drying)?: A Little ?Help from another person to put on and taking off regular upper body clothing?: A Little ?Help from another person to put on and taking off regular lower body clothing?: A Little ?6 Click Score: 20 ? ?  ?End of Session Equipment Utilized During Treatment: Rolling walker (2 wheels) ? ?OT Visit Diagnosis: Other symptoms and signs involving the nervous system (R29.898);Muscle weakness (generalized) (M62.81);Unsteadiness on feet (R26.81) ?  ?Activity Tolerance Patient tolerated treatment well ?  ?Patient Left in chair;with call bell/phone within reach;with chair alarm set ?  ?Nurse Communication Mobility status ?  ? ?   ? ?Time: 3154-0086 ?OT Time Calculation (min): 23 min ? ?Charges: OT General Charges ?$OT Visit: 1 Visit ?OT Treatments ?$Self Care/Home Management : 23-37 mins ? ?Shanon Payor, OTD OTR/L  ?10/09/21, 1:02 PM  ?

## 2021-10-09 NOTE — Progress Notes (Signed)
Physical Therapy Treatment ?Patient Details ?Name: Gabriella Rogers ?MRN: [id number] ?DOB: 03/24/1934 ?Today's Date: 10/09/2021 ? ? ?History of Present Illness Pt is an 88 year old female admitted with small acute ischemic stroke right thalamus and right central semiovale. PMH significant for mild cognitive impairment from home, prior stroke and 2021 on aspirin currently, hyperlipidemia brought to ER with weakness and difficulty getting her words out ? ?  ?PT Comments  ? ? Physical Therapy Treatment completed this date. Patient tolerated session well and was agreeable to treatment. No pain reported throughout session, however patient reported not feeling very well. Patient was alert and oriented x3, however continues to demonstrate mild confusion within conversation. Patient completed supine and EOB therapeutic exercises to focus on BLE strengthening. Patient continues to require increased time/effort for bed mobility, however no physical assistance is required. Upon sitting EOB noted soiled gown and sheets. NT called in for assistance, while patient completed tolieting and pericare at supervision in the bathroom. Patient tolerated increased distance with ambulation, ambulating ~83fet with RW at SBA/SUP. Due to patient reports of not feeling well, patient refused additional therapy after ambulation bout. Patient was left in recliner with all needs met and in reach. Patient would continue to benefit from skilled physical therapy in order to optimize patient's return to PLOF. Continue to recommend CIR upon discharge from acute hospitalization.  ?  ?Recommendations for follow up therapy are one component of a multi-disciplinary discharge planning process, led by the attending physician.  Recommendations may be updated based on patient status, additional functional criteria and insurance authorization. ? ?Follow Up Recommendations ? Acute inpatient rehab (3hours/day) ?  ?  ?Assistance Recommended at Discharge Frequent or  constant Supervision/Assistance  ?Patient can return home with the following Direct supervision/assist for financial management;A lot of help with walking and/or transfers;Direct supervision/assist for medications management;A lot of help with bathing/dressing/bathroom ?  ?Equipment Recommendations ? Other (comment) (TBD at next level of care)  ?  ?Recommendations for Other Services   ? ? ?  ?Precautions / Restrictions Precautions ?Precautions: Fall ?Restrictions ?Weight Bearing Restrictions: No  ?  ? ?Mobility ? Bed Mobility ?Overal bed mobility: Needs Assistance ?Bed Mobility: Supine to Sit ?  ?  ?Supine to sit: Supervision, HOB elevated ?  ?  ?  ?  ? ?Transfers ?Overall transfer level: Needs assistance ?Equipment used: Rolling walker (2 wheels) ?Transfers: Sit to/from Stand ?Sit to Stand: Supervision, Min guard ?  ?  ?  ?  ?  ?General transfer comment: vcs for technique, increased time to come to full standing ?  ? ?Ambulation/Gait ?Ambulation/Gait assistance: Supervision ?Gait Distance (Feet): 80 Feet ?Assistive device: Rolling walker (2 wheels) ?Gait Pattern/deviations: Step-through pattern, Decreased step length - right, Decreased step length - left, Decreased stride length, Trunk flexed, Narrow base of support ?Gait velocity: decreased ?  ?  ?  ? ? ?Stairs ?  ?  ?  ?  ?  ? ? ?Wheelchair Mobility ?  ? ?Modified Rankin (Stroke Patients Only) ?  ? ? ?  ?Balance Overall balance assessment: Needs assistance ?Sitting-balance support: Feet supported ?Sitting balance-Leahy Scale: Fair ?Sitting balance - Comments: able to maintain good sitting balance with BUE support and feet supported ?  ?Standing balance support: Reliant on assistive device for balance, During functional activity, Bilateral upper extremity supported ?Standing balance-Leahy Scale: Fair ?  ?  ?  ?  ?  ?  ?  ?  ?  ?  ?  ?  ?  ? ?  ?  Cognition Arousal/Alertness: Awake/alert ?Behavior During Therapy: [facility] for tasks assessed/performed ?Overall Cognitive  Status: Impaired/Different from baseline ?Area of Impairment: Safety/judgement ?  ?  ?  ?  ?  ?  ?  ?  ?  ?Current Attention Level: Sustained ?Memory: Decreased recall of precautions, Decreased short-term memory ?Following Commands: Follows one step commands consistently, Follows multi-step commands inconsistently ?Safety/Judgement: Decreased awareness of safety ?  ?  ?General Comments: Pt presents with baseline dementia (however completes ADL with MOD I at baseline level); Patient is alert and oriented x4 presents with deficits as noted above. ?  ?  ? ?  ?Exercises Other Exercises ?Other Exercises: x10SLR bilaterally in supine ?Other Exercises: x10 supine glute bridges ?Other Exercises: x10 LAQ from EOB bilaterally ? ?  ?General Comments   ?  ?  ? ?Pertinent Vitals/Pain Pain Assessment ?Pain Assessment: No/denies pain ?Pain Location: Patient reports no pain, however states she is not feeling well today ?Pain Descriptors / Indicators: Discomfort ?Pain Intervention(s): Monitored during session, Repositioned  ? ? ?Home Living   ?  ?  ?  ?  ?  ?  ?  ?  ?  ?   ?  ?Prior Function    ?  ?  ?   ? ?PT Goals (current goals can now be found in the care plan section) Acute Rehab PT Goals ?Patient Stated Goal: to get better ?PT Goal Formulation: With patient ?Time For Goal Achievement: 10/21/21 ?Potential to Achieve Goals: Fair ?Progress towards PT goals: Progressing toward goals ? ?  ?Frequency ? ? ? 7X/week ? ? ? ?  ?PT Plan Current plan remains appropriate  ? ? ?Co-evaluation   ?  ?  ?  ?  ? ?  ?AM-PAC PT "6 Clicks" Mobility   ?Outcome Measure ? Help needed turning from your back to your side while in a flat bed without using bedrails?: A Little ?Help needed moving from lying on your back to sitting on the side of a flat bed without using bedrails?: A Little ?Help needed moving to and from a bed to a chair (including a wheelchair)?: A Little ?Help needed standing up from a chair using your arms (e.g., wheelchair or bedside  chair)?: A Little ?Help needed to walk in hospital room?: A Little ?Help needed climbing 3-5 steps with a railing? : A Lot ?6 Click Score: 17 ? ?  ?End of Session Equipment Utilized During Treatment: Gait belt ?Activity Tolerance: Patient tolerated treatment well ?Patient left: in chair;with call bell/phone within reach;with chair alarm set ?Nurse Communication: Mobility status ?PT Visit Diagnosis: Unsteadiness on feet (R26.81);History of falling (Z91.81);Other abnormalities of gait and mobility (R26.89);Other symptoms and signs involving the nervous system (R29.898) ?  ? ? ?Time: 6805605262 ?PT Time Calculation (min) (ACUTE ONLY): 23 min ? ?Charges:  $Therapeutic Exercise: 8-22 mins ?$Therapeutic Activity: 8-22 mins          ?          ? ?Iva Boop, PT  ?10/09/21. 10:32 AM ? ? ?

## 2021-10-09 NOTE — Progress Notes (Signed)
Inpatient Rehabilitation Admissions Coordinator  ? ?Noted therapy progress today. Case discussed with Dr Carlis Abbott. She has progressed to a level to no longer need Cir level therapies. I called patient's daughter, [name] to update. I have updated acute team and TOC. We will not pursue Cir and we will sign off at this time. ? ?Ottie Glazier, RN, MSN ?Rehab Admissions Coordinator ?[phone] ?10/09/2021 11:39 AM ?  ?

## 2021-10-09 NOTE — TOC Progression Note (Signed)
Transition of Care (TOC) - Progression Note  ? ? ?Patient Details  ?Name: Gabriella Rogers ?MRN: [id number] ?Date of Birth: [birth year] ? ?Transition of Care (TOC) CM/SW Contact  ?Caryn Section, RN ?Phone Number: ?10/09/2021, 10:05 AM ? ?Clinical Narrative:   As per Britta Mccreedy at [facility], waiting on insurance approval.   ? ? ? ?Expected Discharge Plan: Skilled Nursing Facility ?Barriers to Discharge: Continued Medical Work up ? ?Expected Discharge Plan and Services ?Expected Discharge Plan: Skilled Nursing Facility ?  ?Discharge Planning Services: CM Consult ?  ?Living arrangements for the past 2 months: Single Family Home ?                ?DME Arranged: N/A ?DME Agency: NA ?  ?  ?  ?  ?  ?  ?  ?  ? ? ?Social Determinants of Health (SDOH) Interventions ?  ? ?Readmission Risk Interventions ?   ? View : No data to display.  ?  ?  ?  ? ? ?

## 2021-10-10 DIAGNOSIS — I639 Cerebral infarction, unspecified: Secondary | ICD-10-CM | POA: Diagnosis not present

## 2021-10-10 MED ORDER — AMLODIPINE BESYLATE 5 MG PO TABS
5.0000 mg | ORAL_TABLET | Freq: Every day | ORAL | Status: DC
Start: 2021-10-10 — End: 2021-10-15
  Administered 2021-10-10 – 2021-10-15 (×6): 5 mg via ORAL
  Filled 2021-10-10 (×6): qty 1

## 2021-10-10 MED ORDER — POLYETHYLENE GLYCOL 3350 17 G PO PACK
17.0000 g | PACK | Freq: Every day | ORAL | Status: DC
  Administered 2021-10-10 – 2021-10-15 (×4): 17 g via ORAL
  Filled 2021-10-10 (×4): qty 1

## 2021-10-10 MED ORDER — DOCUSATE SODIUM 100 MG PO CAPS
100.0000 mg | ORAL_CAPSULE | Freq: Two times a day (BID) | ORAL | Status: DC
  Administered 2021-10-10 – 2021-10-14 (×5): 100 mg via ORAL
  Filled 2021-10-10 (×6): qty 1

## 2021-10-10 MED ORDER — CYANOCOBALAMIN 1000 MCG/ML IJ SOLN
1000.0000 ug | Freq: Once | INTRAMUSCULAR | Status: AC
  Administered 2021-10-10: 1000 ug via INTRAMUSCULAR
  Filled 2021-10-10: qty 1

## 2021-10-10 NOTE — Progress Notes (Signed)
Physical Therapy Treatment ?Patient Details ?Name: Gabriella Rogers ?MRN: [id number] ?DOB: 11-20-1933 ?Today's Date: 10/10/2021 ? ? ?History of Present Illness Pt is an 88 year old female admitted with small acute ischemic stroke right thalamus and right central semiovale. PMH significant for mild cognitive impairment from home, prior stroke and 2021 on aspirin currently, hyperlipidemia brought to ER with weakness and difficulty getting her words out ? ?  ?PT Comments  ? ? Physical Therapy Treatment completed this date. Upon entry into room patient was sitting EOB looking out the window. Patient states she is not feeling good today, and her head/neck hurt like she slept on it wrong. Increased BUE tremors noted this date, patient patient appeared more fatigued and weak compared to previous session. Patient required CGA/Min A to complete sit to stands from EOB and BSC. On first attempt to come to standing patient was unable to complete with bed height at lowest setting and CGA. On second attempt patient was able to complete CGA/Min A from elevated height. Upon sitting EOB patient was noted to be sitting in soiled gown and bed. NT called for assistance. Patient continues to require Max A for pericare, and appears to be unable to state when she requires to utilize the restroom. Once cleaned up, patient ambulated a few feet from [facility] > Recliner at CGA/SBA. Patient would continue to benefit from skilled physical therapy in order to optimize patient's return to PLOF. Continue to recommend CIR upon discharge from acute hospitalization.  ?   ?Recommendations for follow up therapy are one component of a multi-disciplinary discharge planning process, led by the attending physician.  Recommendations may be updated based on patient status, additional functional criteria and insurance authorization. ? ?Follow Up Recommendations ? Acute inpatient rehab (3hours/day) ?  ?  ?Assistance Recommended at Discharge Frequent or constant  Supervision/Assistance  ?Patient can return home with the following Direct supervision/assist for financial management;A lot of help with walking and/or transfers;Direct supervision/assist for medications management;A lot of help with bathing/dressing/bathroom ?  ?Equipment Recommendations ? Other (comment) (TBD at next level of care)  ?  ?Recommendations for Other Services   ? ? ?  ?Precautions / Restrictions Precautions ?Precautions: Fall ?Restrictions ?Weight Bearing Restrictions: No  ?  ? ?Mobility ? Bed Mobility ?  ?  ?  ?  ?  ?  ?  ?General bed mobility comments: Patient was sitting EOB upon arrival ?  ? ?Transfers ?Overall transfer level: Needs assistance ?Equipment used: Rolling walker (2 wheels) ?Transfers: Sit to/from Stand, Bed to chair/wheelchair/BSC ?Sit to Stand: Min guard, Min assist ?  ?  ?  ?  ?  ?General transfer comment: vcs for technique, increased time to come to full standing ?  ? ?Ambulation/Gait ?Ambulation/Gait assistance: Min guard ?Gait Distance (Feet): 8 Feet ?Assistive device: Rolling walker (2 wheels) ?Gait Pattern/deviations: Narrow base of support, Shuffle ?Gait velocity: decreased ?  ?  ?General Gait Details: patient appeared more unsteady during ambulation this bout, no LOB noted ? ? ?Stairs ?  ?  ?  ?  ?  ? ? ?Wheelchair Mobility ?  ? ?Modified Rankin (Stroke Patients Only) ?  ? ? ?  ?Balance Overall balance assessment: Needs assistance ?Sitting-balance support: Feet supported ?Sitting balance-Leahy Scale: Fair ?Sitting balance - Comments: able to maintain good sitting balance with BUE support and feet supported ?Postural control: Left lateral lean ?Standing balance support: Reliant on assistive device for balance, During functional activity, Bilateral upper extremity supported ?Standing balance-Leahy Scale: Fair ?Standing balance  comment: required Max A for pericare with BUE support from RW ?  ?  ?  ?  ?  ?  ?  ?  ?  ?  ?  ?  ? ?  ?Cognition Arousal/Alertness:  Awake/alert ?Behavior During Therapy: Flat affect ?Overall Cognitive Status: Impaired/Different from baseline ?  ?  ?  ?  ?  ?  ?  ?  ?  ?  ?  ?Memory: Decreased recall of precautions, Decreased short-term memory ?Following Commands: Follows one step commands consistently, Follows multi-step commands inconsistently ?Safety/Judgement: Decreased awareness of safety ?  ?  ?  ?  ?  ? ?  ?Exercises   ? ?  ?General Comments General comments (skin integrity, edema, etc.): BP: 135/51mmHg, HR: 87bpm, SpO2: 94% ?  ?  ? ?Pertinent Vitals/Pain Pain Assessment ?Pain Assessment: 0-10 ?Pain Score: 2  ?Faces Pain Scale: Hurts little more ?Pain Location: Head/neck ?Pain Descriptors / Indicators: Discomfort ?Pain Intervention(s): Monitored during session, Repositioned  ? ? ?Home Living   ?  ?  ?  ?  ?  ?  ?  ?  ?  ?   ?  ?Prior Function    ?  ?  ?   ? ?PT Goals (current goals can now be found in the care plan section) Acute Rehab PT Goals ?Patient Stated Goal: to get better ?PT Goal Formulation: With patient ?Time For Goal Achievement: 10/21/21 ?Potential to Achieve Goals: Fair ?Progress towards PT goals: Progressing toward goals ? ?  ?Frequency ? ? ? 7X/week ? ? ? ?  ?PT Plan Current plan remains appropriate  ? ? ?Co-evaluation   ?  ?  ?  ?  ? ?  ?AM-PAC PT "6 Clicks" Mobility   ?Outcome Measure ? Help needed turning from your back to your side while in a flat bed without using bedrails?: A Little ?Help needed moving from lying on your back to sitting on the side of a flat bed without using bedrails?: A Little ?Help needed moving to and from a bed to a chair (including a wheelchair)?: A Little ?Help needed standing up from a chair using your arms (e.g., wheelchair or bedside chair)?: A Little ?Help needed to walk in hospital room?: A Little ?Help needed climbing 3-5 steps with a railing? : A Lot ?6 Click Score: 17 ? ?  ?End of Session Equipment Utilized During Treatment: Gait belt ?Activity Tolerance: Patient tolerated treatment  well;Patient limited by fatigue ?Patient left: in chair;with call bell/phone within reach;with chair alarm set ?Nurse Communication: Mobility status ?PT Visit Diagnosis: Unsteadiness on feet (R26.81);History of falling (Z91.81);Other abnormalities of gait and mobility (R26.89);Other symptoms and signs involving the nervous system (R29.898) ?  ? ? ?Time: 7026-3785 ?PT Time Calculation (min) (ACUTE ONLY): 35 min ? ?Charges:  $Therapeutic Activity: 8-22 mins ?$Self Care/Home Management: 8-22          ?          ? ?Angelica Ran, PT  ?10/10/21. 12:36 PM ? ? ?

## 2021-10-10 NOTE — Progress Notes (Signed)
Nurse called to room by NT r/t patient bleeding from her right ear.  Patient found to have a small stream of blood, partially dried running down her jaw line with pooled wet blood in her ear.  MD notified and ear cleaned with warm wash cloth.  MD at bedside to assess patient and requested ear be lightly packed because bleeding is superficial.  Ear lightly packed with gauze and patient educated to leave packing in until removed by staff.  Will continue to monitor. ?

## 2021-10-10 NOTE — Progress Notes (Signed)
?PROGRESS NOTE ? ? ? ?Gabriella Rogers  [id number] DOB: 30-Jan-1934 DOA: 10/06/2021 ?PCP: Corky Downs, MD  ? ? ?Brief Narrative:  ?88 year old with history of mild cognitive impairment from home, prior stroke and 2021 on aspirin currently, hyperlipidemia brought to ER with weakness and difficulty getting her words out.  In the emergency room, hemodynamically stable.  No focal neurological deficit.  Patient was found to have small acute ischemic stroke right thalamus and right central semiovale.  She was also found with febrile episode. ? ? ?Assessment & Plan: ?  ?Febrile episode: Temperature 103 with tachycardia on presentation.  Sepsis ruled out. ?Chest x-ray with no obvious pneumonia, COVID-19 and flu negative.  Urinalysis fairly normal.  Upper airway sounds noted after patient was given medications.  ?Initially suspected aspiration pneumonitis with negative infection work-up.  Treated with antibiotics for 24 hours then discontinued.  No recurrent fever. Seen by speech therapy, found to have no obvious aspiration. ?  ?Acute ischemic stroke: ?Patient was not a TPA and vascular intervention candidate because of very minor symptoms. ?Diet, patient able to eat regular diet. ?Antiplatelets, on aspirin at home.  Loaded with aspirin and Plavix.  Recommended indefinite aspirin and Plavix. ?Statin, LDL 108.  On atorvastatin 80 mg at home, continued. ?Blood pressure goals, not on treatment at home.  Blood pressures consistently elevated.  We will start amlodipine 5 mg daily today. ?MRI of the brain as above. ?CT angiogram head and neck, no large vessel occlusion. ?2D echocardiogram with normal ejection fraction, no evidence of intracardiac thrombus. ?Neurology consultation, signed off ?PT/OT working with the patient, recommended skilled nursing facility placement.  Medically stable.  Waiting for bed. ? ?Acute kidney injury: Improved and normalized. ? ?Mild cognitive impairment and cervical dystonia: On donepezil.  Received  B[facility] with neurology at [facility].  She is also on clonazepam as needed. ? ?Vitamin B12 deficiency: Screening test for vitamin B12 on admission with serum level of 159.  ?1 dose of vitamin B12 injection given.  We will keep on high-dose B12 supplement.   ? ?She can be off telemetry.  Continue mobility.  Management of constipation.  Transfer to SNF whenever bed is available. ? ? ?DVT prophylaxis: enoxaparin (LOVENOX) injection 40 mg Start: 10/08/21 1020 ? ? ?Code Status: Full code ?Family Communication: None today. ?Disposition Plan: Status is: Inpatient ?Remains inpatient appropriate because: Medically stable to transfer to rehab when bed available. ?  ? ? ?Consultants:  ?Neurology ? ?Procedures:  ?None ? ?Antimicrobials:  ?Unasyn 4/19-4/20 ? ? ?Subjective: ? ?Patient seen and examined.  Sitting at the edge of the bed.  Feels constipated.  Just does not feel well. ? ?Objective: ?Vitals:  ? 10/09/21 1545 10/09/21 2006 10/10/21 0532 10/10/21 0734  ?BP: (!) 142/57 125/69 (!) 169/91 (!) 189/82  ?Pulse: 83 84 73 72  ?Resp: 16 19 14 16   ?Temp: 97.9 ?F (36.6 ?C) 98.1 ?F (36.7 ?C) (!) 97.5 ?F (36.4 ?C) 97.7 ?F (36.5 ?C)  ?TempSrc: Oral   Oral  ?SpO2: 98% 98% 97% 96%  ?Weight:      ? ?No intake or output data in the 24 hours ending 10/10/21 1103 ? ?Filed Weights  ? 10/06/21 2335  ?Weight: 54.5 kg  ? ? ?Examination: ? ?General: Frail and debilitated.  Not in any distress.  On room air.  Slightly anxious and flat affect. ?Cardiovascular: S1-S2 normal.  Regular rate rhythm. ?Respiratory: Bilateral clear.  No added sounds. ?Gastrointestinal: Soft.  Nontender.  Bowel sound present. ?Ext: No cyanosis or  edema.  No swelling. ?Neuro: Alert and oriented.  Flat affect.  No focal neurological deficits. ?Musculoskeletal: No deformities. ? ? ? ?Data Reviewed: I have personally reviewed following labs and imaging studies ? ?CBC: ?Recent Labs  ?Lab 10/07/21 ?0006 10/07/21 ?1805  ?WBC 12.2* 12.0*  ?NEUTROABS 10.7* 10.3*  ?HGB 11.9* 13.5  ?HCT  35.7* 40.8  ?MCV 101.7* 103.6*  ?PLT 305 263  ? ?Basic Metabolic Panel: ?Recent Labs  ?Lab 10/07/21 ?0006 10/08/21 ?0501  ?NA 137 138  ?K 3.0* 3.9  ?CL 102 110  ?CO2 26 24  ?GLUCOSE 120* 94  ?BUN 19 11  ?CREATININE 1.21* 0.68  ?CALCIUM 8.7* 8.3*  ? ?GFR: ?Estimated Creatinine Clearance: 41.8 mL/min (by C-G formula based on SCr of 0.68 mg/dL). ?Liver Function Tests: ?Recent Labs  ?Lab 10/07/21 ?0006  ?AST 36  ?ALT 14  ?ALKPHOS 86  ?BILITOT 1.1  ?PROT 7.6  ?ALBUMIN 3.6  ? ?No results for input(s): LIPASE, AMYLASE in the last 168 hours. ?No results for input(s): AMMONIA in the last 168 hours. ?Coagulation Profile: ?Recent Labs  ?Lab 10/07/21 ?0006  ?INR 1.1  ? ?Cardiac Enzymes: ?No results for input(s): CKTOTAL, CKMB, CKMBINDEX, TROPONINI in the last 168 hours. ?BNP (last 3 results) ?No results for input(s): PROBNP in the last 8760 hours. ?HbA1C: ?No results for input(s): HGBA1C in the last 72 hours. ? ?CBG: ?Recent Labs  ?Lab 10/06/21 ?2335  ?GLUCAP 131*  ? ?Lipid Profile: ?No results for input(s): CHOL, HDL, LDLCALC, TRIG, CHOLHDL, LDLDIRECT in the last 72 hours. ? ?Thyroid Function Tests: ?No results for input(s): TSH, T4TOTAL, FREET4, T3FREE, THYROIDAB in the last 72 hours. ?Anemia Panel: ?No results for input(s): VITAMINB12, FOLATE, FERRITIN, TIBC, IRON, RETICCTPCT in the last 72 hours. ? ?Sepsis Labs: ?Recent Labs  ?Lab 10/07/21 ?0718 10/08/21 ?0501  ?PROCALCITON 0.12 0.75  ?LATICACIDVEN 0.9  --   ? ? ?Recent Results (from the past 240 hour(s))  ?Resp Panel by RT-PCR (Flu A&B, Covid) Nasopharyngeal Swab     Status: None  ? Collection Time: 10/07/21 12:58 AM  ? Specimen: Nasopharyngeal Swab; Nasopharyngeal(NP) swabs in vial transport medium  ?Result Value Ref Range Status  ? SARS Coronavirus 2 by RT PCR NEGATIVE NEGATIVE Final  ?  Comment: (NOTE) ?SARS-CoV-2 target nucleic acids are NOT DETECTED. ? ?The SARS-CoV-2 RNA is generally detectable in upper respiratory ?specimens during the acute phase of infection. The  lowest ?concentration of SARS-CoV-2 viral copies this assay can detect is ?138 copies/mL. A negative result does not preclude SARS-Cov-2 ?infection and should not be used as the sole basis for treatment or ?other patient management decisions. A negative result may occur with  ?improper specimen collection/handling, submission of specimen other ?than nasopharyngeal swab, presence of viral mutation(s) within the ?areas targeted by this assay, and inadequate number of viral ?copies(<138 copies/mL). A negative result must be combined with ?clinical observations, patient history, and epidemiological ?information. The expected result is Negative. ? ?Fact Sheet for Patients:  ?BloggerCourse.comhttps://www.fda.gov/media/152166/download ? ?Fact Sheet for Healthcare Providers:  ?SeriousBroker.ithttps://www.fda.gov/media/152162/download ? ?This test is no t yet approved or cleared by the [place] FDA and  ?has been authorized for detection and/or diagnosis of SARS-CoV-2 by ?FDA under an Emergency Use Authorization (EUA). This EUA will remain  ?in effect (meaning this test can be used) for the duration of the ?COVID-19 declaration under Section 564(b)(1) of the Act, 21 ?U.S.C.section 360bbb-3(b)(1), unless the authorization is terminated  ?or revoked sooner.  ? ? ?  ? Influenza A by PCR NEGATIVE  NEGATIVE Final  ? Influenza B by PCR NEGATIVE NEGATIVE Final  ?  Comment: (NOTE) ?The Xpert Xpress SARS-CoV-2/FLU/RSV plus assay is intended as an aid ?in the diagnosis of influenza from Nasopharyngeal swab specimens and ?should not be used as a sole basis for treatment. Nasal washings and ?aspirates are unacceptable for Xpert Xpress SARS-CoV-2/FLU/RSV ?testing. ? ?Fact Sheet for Patients: ?BloggerCourse.com ? ?Fact Sheet for Healthcare Providers: ?SeriousBroker.it ? ?This test is not yet approved or cleared by the [place] FDA and ?has been authorized for detection and/or diagnosis of SARS-CoV-2 by ?FDA under  an Emergency Use Authorization (EUA). This EUA will remain ?in effect (meaning this test can be used) for the duration of the ?COVID-19 declaration under Section 564(b)(1) of the Act, 21 U.S.C. ?sectio

## 2021-10-10 NOTE — Progress Notes (Signed)
Patient has developed delirium on and off which is expected from underlying dementia and now with a stroke.  No evidence of infection. ?Was complaining of constipation, will be starting on laxatives. ?Delirium precautions, fall precautions.  Patient is confused but very well composed. ? ?Patient also developed some fresh bleeding from her right ear, examination reveals superficial bleeding, no source.  Tympanic membrane's poorly visualized but there is no bleeding near the tympanic membrane.  Cleaned and packed with gauze.  Will need to be monitored. ? ?Called and updated patient's daughter. ?CODE STATUS changed to DNR. ?Continue to pursue a SNF. ?

## 2021-10-11 DIAGNOSIS — I639 Cerebral infarction, unspecified: Secondary | ICD-10-CM | POA: Diagnosis not present

## 2021-10-11 MED ORDER — CIPROFLOXACIN-DEXAMETHASONE 0.3-0.1 % OT SUSP
4.0000 [drp] | Freq: Two times a day (BID) | OTIC | Status: DC
  Administered 2021-10-11 (×2): 4 [drp] via OTIC
  Filled 2021-10-11: qty 7.5

## 2021-10-11 NOTE — Progress Notes (Signed)
Physical Therapy Treatment ?Patient Details ?Name: Gabriella Rogers ?MRN: [id number] ?DOB: [birth year] ?Today's Date: 10/11/2021 ? ? ?History of Present Illness Pt is an 88 year old female admitted with small acute ischemic stroke right thalamus and right central semiovale. PMH significant for mild cognitive impairment from home, prior stroke and 2021 on aspirin currently, hyperlipidemia brought to ER with weakness and difficulty getting her words out ? ?  ?PT Comments  ? ? Pt seen for PT tx with pt received in bed. Pt appears with flat affect but follows commands with extra time. Pt is able to complete supine>sit with supervision but significantly extra time to complete. Pt requires min assist for STS & ambulates increased distances with RW & min assist. Pt demonstrates decreased awareness to environment & requires cuing to move away from rail on R. Pt denies pain but endorses feeling generally unwell. Pt requesting to rest in recliner at end of session. Updated d/c recommendations as pt not appropriate candidate for CIR (per notes) but would still benefit from ongoing therapy services prior to return home. ?    ?Recommendations for follow up therapy are one component of a multi-disciplinary discharge planning process, led by the attending physician.  Recommendations may be updated based on patient status, additional functional criteria and insurance authorization. ? ?Follow Up Recommendations ? Skilled nursing-short term rehab (<3 hours/day) ?  ?  ?Assistance Recommended at Discharge Frequent or constant Supervision/Assistance  ?Patient can return home with the following Direct supervision/assist for financial management;Direct supervision/assist for medications management;A lot of help with bathing/dressing/bathroom;A little help with walking and/or transfers;Help with stairs or ramp for entrance;Assistance with cooking/housework;Assist for transportation ?  ?Equipment Recommendations ? Rolling walker (2 wheels)  ?   ?Recommendations for Other Services   ? ? ?  ?Precautions / Restrictions Precautions ?Precautions: Fall ?Restrictions ?Weight Bearing Restrictions: No  ?  ? ?Mobility ? Bed Mobility ?Overal bed mobility: Needs Assistance ?Bed Mobility: Supine to Sit ?  ?  ?Supine to sit: Supervision, HOB elevated ?  ?  ?General bed mobility comments: Pt is able to transfer to sitting EOB but requires ~5 minutes to do so. PT attempts to educate pt on lateral leans to increase ease of scooting but pt with poor understanding of instructions. ?  ? ?Transfers ?Overall transfer level: Needs assistance ?Equipment used: Rolling walker (2 wheels) ?Transfers: Sit to/from Stand ?Sit to Stand: Min assist ?  ?  ?  ?  ?  ?General transfer comment: cuing for safe hand placement during sit>stand, min assist, posterior lean onto bed with BLE ?  ? ?Ambulation/Gait ?Ambulation/Gait assistance: Min assist ?Gait Distance (Feet): 100 Feet ?Assistive device: Rolling walker (2 wheels) ?Gait Pattern/deviations: Decreased step length - right, Decreased step length - left, Decreased stride length, Decreased dorsiflexion - right, Decreased dorsiflexion - left, Trunk flexed ?Gait velocity: decreased ?  ?  ?General Gait Details: Pt with head down throughout gait, walks against railing on R side with hand rubbing it & still requires cuing to move away from wall/rail. ? ? ?Stairs ?  ?  ?  ?  ?  ? ? ?Wheelchair Mobility ?  ? ?Modified Rankin (Stroke Patients Only) ?  ? ? ?  ?Balance Overall balance assessment: Needs assistance ?Sitting-balance support: Feet supported, Bilateral upper extremity supported ?Sitting balance-Leahy Scale: Fair ?Sitting balance - Comments: supervision static sitting EOB ?  ?Standing balance support: Reliant on assistive device for balance, During functional activity, Bilateral upper extremity supported ?Standing balance-Leahy Scale: Poor ?  ?  ?  ?  ?  ?  ?  ?  ?  ?  ?  ?  ?  ? ?  ?  Cognition Arousal/Alertness: Awake/alert ?Behavior  During Therapy: Flat affect ?Overall Cognitive Status: Impaired/Different from baseline ?Area of Impairment: Orientation, Attention, Safety/judgement, Awareness, Following commands, Problem solving ?  ?  ?  ?  ?  ?  ?  ?  ?Orientation Level: Disoriented to, Situation ?  ?Memory: Decreased recall of precautions, Decreased short-term memory ?Following Commands: Follows one step commands consistently, Follows one step commands with increased time ?Safety/Judgement: Decreased awareness of safety ?Awareness: Anticipatory ?  ?  ?  ?  ? ?  ?Exercises   ? ?  ?General Comments General comments (skin integrity, edema, etc.): Pt reports just feeling unwell & weak ?  ?  ? ?Pertinent Vitals/Pain Pain Assessment ?Pain Assessment: No/denies pain  ? ? ?Home Living   ?  ?  ?  ?  ?  ?  ?  ?  ?  ?   ?  ?Prior Function    ?  ?  ?   ? ?PT Goals (current goals can now be found in the care plan section) Acute Rehab PT Goals ?Patient Stated Goal: to get better ?PT Goal Formulation: With patient ?Time For Goal Achievement: 10/21/21 ?Potential to Achieve Goals: Fair ?Progress towards PT goals: Progressing toward goals ? ?  ?Frequency ? ? ? 7X/week ? ? ? ?  ?PT Plan Discharge plan needs to be updated  ? ? ?Co-evaluation   ?  ?  ?  ?  ? ?  ?AM-PAC PT "6 Clicks" Mobility   ?Outcome Measure ? Help needed turning from your back to your side while in a flat bed without using bedrails?: A Little ?Help needed moving from lying on your back to sitting on the side of a flat bed without using bedrails?: A Little ?Help needed moving to and from a bed to a chair (including a wheelchair)?: A Little ?Help needed standing up from a chair using your arms (e.g., wheelchair or bedside chair)?: A Little ?Help needed to walk in hospital room?: A Little ?Help needed climbing 3-5 steps with a railing? : A Lot ?6 Click Score: 17 ? ?  ?End of Session   ?Activity Tolerance: Patient tolerated treatment well;Patient limited by fatigue ?Patient left: with chair alarm  set;in chair;with call bell/phone within reach ?  ?PT Visit Diagnosis: Unsteadiness on feet (R26.81);History of falling (Z91.81);Other abnormalities of gait and mobility (R26.89);Other symptoms and signs involving the nervous system (R29.898) ?  ? ? ?Time: [id number] ?PT Time Calculation (min) (ACUTE ONLY): 23 min ? ?Charges:  $Therapeutic Activity: 23-37 mins          ?          ? ?Lavone Nian, PT, DPT ?10/11/21, 12:15 PM ? ? ? ?Waunita Schooner ?10/11/2021, 12:12 PM ? ?

## 2021-10-11 NOTE — Progress Notes (Signed)
?PROGRESS NOTE ? ? ? ?Gabriella HopperSylvia C Rogers  ZOX:[id number] DOB: 06/26/1933 DOA: 10/06/2021 ?PCP: Corky DownsMasoud, Javed, MD  ? ?Brief Narrative:  ?88 year old with history of mild cognitive impairment from home, prior stroke and 2021 on aspirin currently, hyperlipidemia brought to ER with weakness and difficulty getting her words out.  In the emergency room, hemodynamically stable.  No focal neurological deficit.  Patient was found to have small acute ischemic stroke right thalamus and right central semiovale.  She was also found with febrile episode.  Eventually no signs of infections were noted therefore she was kept off antibiotics.  She was also seen by neurology for acute CVA and she was started on aspirin and Plavix indefinitely.  Work-up including CTA head and neck and 2D echocardiogram was overall unremarkable. ?PT recommended SNF. ? ? ?Assessment & Plan: ? Principal Problem: ?  Acute CVA (cerebrovascular accident) (HCC) ?Active Problems: ?  AKI (acute kidney injury) (HCC) ?  Hypokalemia ?  Macrocytic anemia ?  Leukocytosis ?  Cervical dystonia ?  Depression ?  Mild cognitive impairment ?  Generalized weakness ?  Stroke (cerebrum) (HCC) ?  ? ? ?Assessment and Plan: ?* Acute CVA (cerebrovascular accident) (HCC) ?-MRI of the brain is consistent with acute CVA, patient not a candidate for tPA.  LDL noted to be 80.  She was seen by neurology who recommended aspirin and Plavix indefinitely along with statin ? ? ?Hypokalemia ?Replete as necessary ? ?AKI (acute kidney injury) (HCC) ?Resolved ? ?Leukocytosis ?Stable, no evidence of infection. ? ?Macrocytic anemia ?Follow anemia profile ? ?Generalized weakness ?Secondary to underlying stroke.  PT/OT recommended SNF ? ?Mild cognitive impairment ?Continue donepezil ? ?Cervical dystonia ?Receives Botox by [facility] neurology ?Continue clonazepam as needed ? ? ? ? ?  ? ? ? ?DVT prophylaxis: enoxaparin (LOVENOX) injection 40 mg Start: 10/08/21 1020 ? ?Code Status: DNR ?Family Communication:    ? ?Status is: Inpatient ?Remains inpatient appropriate because: Currently awaiting SNF bed ? ? ? ? ?Subjective: ?Feels okay no complaints. ?No acute events overnight ? ? ?Objective: ?Vitals:  ? 10/10/21 1604 10/10/21 2112 10/11/21 0542 10/11/21 0700  ?BP: 131/79 119/69 (!) 148/71 (!) 165/81  ?Pulse: 83 84 86 85  ?Resp: 16 17 20 18   ?Temp: 98 ?F (36.7 ?C) 98.4 ?F (36.9 ?C) 98.9 ?F (37.2 ?C)   ?TempSrc: Oral     ?SpO2: 95% 98% 99%   ?Weight:      ? ? ?Intake/Output Summary (Last 24 hours) at 10/11/2021 1156 ?Last data filed at 10/10/2021 1300 ?Gross per 24 hour  ?Intake 240 ml  ?Output --  ?Net 240 ml  ? ?Filed Weights  ? 10/06/21 2335  ?Weight: 54.5 kg  ? ? ? ?Data Reviewed:  ? ?CBC: ?Recent Labs  ?Lab 10/07/21 ?0006 10/07/21 ?1805  ?WBC 12.2* 12.0*  ?NEUTROABS 10.7* 10.3*  ?HGB 11.9* 13.5  ?HCT 35.7* 40.8  ?MCV 101.7* 103.6*  ?PLT 305 263  ? ?Basic Metabolic Panel: ?Recent Labs  ?Lab 10/07/21 ?0006 10/08/21 ?0501  ?NA 137 138  ?K 3.0* 3.9  ?CL 102 110  ?CO2 26 24  ?GLUCOSE 120* 94  ?BUN 19 11  ?CREATININE 1.21* 0.68  ?CALCIUM 8.7* 8.3*  ? ?GFR: ?CrCl cannot be calculated (Unknown ideal weight.). ?Liver Function Tests: ?Recent Labs  ?Lab 10/07/21 ?0006  ?AST 36  ?ALT 14  ?ALKPHOS 86  ?BILITOT 1.1  ?PROT 7.6  ?ALBUMIN 3.6  ? ?No results for input(s): LIPASE, AMYLASE in the last 168 hours. ?No results for input(s): AMMONIA  in the last 168 hours. ?Coagulation Profile: ?Recent Labs  ?Lab 10/07/21 ?0006  ?INR 1.1  ? ?Cardiac Enzymes: ?No results for input(s): CKTOTAL, CKMB, CKMBINDEX, TROPONINI in the last 168 hours. ?BNP (last 3 results) ?No results for input(s): PROBNP in the last 8760 hours. ?HbA1C: ?No results for input(s): HGBA1C in the last 72 hours. ?CBG: ?Recent Labs  ?Lab 10/06/21 ?2335  ?GLUCAP 131*  ? ?Lipid Profile: ?No results for input(s): CHOL, HDL, LDLCALC, TRIG, CHOLHDL, LDLDIRECT in the last 72 hours. ?Thyroid Function Tests: ?No results for input(s): TSH, T4TOTAL, FREET4, T3FREE, THYROIDAB in the last 72  hours. ?Anemia Panel: ?No results for input(s): VITAMINB12, FOLATE, FERRITIN, TIBC, IRON, RETICCTPCT in the last 72 hours. ?Sepsis Labs: ?Recent Labs  ?Lab 10/07/21 ?0718 10/08/21 ?0501  ?PROCALCITON 0.12 0.75  ?LATICACIDVEN 0.9  --   ? ? ?Recent Results (from the past 240 hour(s))  ?Resp Panel by RT-PCR (Flu A&B, Covid) Nasopharyngeal Swab     Status: None  ? Collection Time: 10/07/21 12:58 AM  ? Specimen: Nasopharyngeal Swab; Nasopharyngeal(NP) swabs in vial transport medium  ?Result Value Ref Range Status  ? SARS Coronavirus 2 by RT PCR NEGATIVE NEGATIVE Final  ?  Comment: (NOTE) ?SARS-CoV-2 target nucleic acids are NOT DETECTED. ? ?The SARS-CoV-2 RNA is generally detectable in upper respiratory ?specimens during the acute phase of infection. The lowest ?concentration of SARS-CoV-2 viral copies this assay can detect is ?138 copies/mL. A negative result does not preclude SARS-Cov-2 ?infection and should not be used as the sole basis for treatment or ?other patient management decisions. A negative result may occur with  ?improper specimen collection/handling, submission of specimen other ?than nasopharyngeal swab, presence of viral mutation(s) within the ?areas targeted by this assay, and inadequate number of viral ?copies(<138 copies/mL). A negative result must be combined with ?clinical observations, patient history, and epidemiological ?information. The expected result is Negative. ? ?Fact Sheet for Patients:  ?BloggerCourse.com ? ?Fact Sheet for Healthcare Providers:  ?SeriousBroker.it ? ?This test is no t yet approved or cleared by the [place] FDA and  ?has been authorized for detection and/or diagnosis of SARS-CoV-2 by ?FDA under an Emergency Use Authorization (EUA). This EUA will remain  ?in effect (meaning this test can be used) for the duration of the ?COVID-19 declaration under Section 564(b)(1) of the Act, 21 ?U.S.C.section 360bbb-3(b)(1), unless the  authorization is terminated  ?or revoked sooner.  ? ? ?  ? Influenza A by PCR NEGATIVE NEGATIVE Final  ? Influenza B by PCR NEGATIVE NEGATIVE Final  ?  Comment: (NOTE) ?The Xpert Xpress SARS-CoV-2/FLU/RSV plus assay is intended as an aid ?in the diagnosis of influenza from Nasopharyngeal swab specimens and ?should not be used as a sole basis for treatment. Nasal washings and ?aspirates are unacceptable for Xpert Xpress SARS-CoV-2/FLU/RSV ?testing. ? ?Fact Sheet for Patients: ?BloggerCourse.com ? ?Fact Sheet for Healthcare Providers: ?SeriousBroker.it ? ?This test is not yet approved or cleared by the [place] FDA and ?has been authorized for detection and/or diagnosis of SARS-CoV-2 by ?FDA under an Emergency Use Authorization (EUA). This EUA will remain ?in effect (meaning this test can be used) for the duration of the ?COVID-19 declaration under Section 564(b)(1) of the Act, 21 U.S.C. ?section 360bbb-3(b)(1), unless the authorization is terminated or ?revoked. ? ?Performed at [facility], [address] [place], ?[place] [address] ?  ?Culture, blood (Routine X 2) w Reflex to ID Panel     Status: None (Preliminary result)  ? Collection Time: 10/07/21  6:52 AM  ? Specimen: BLOOD  ?Result Value Ref Range Status  ? Specimen Description BLOOD BLOOD RIGHT HAND  Final  ? Special Requests   Final  ?  BOTTLES DRAWN AEROBIC AND ANAEROBIC Blood Culture results may not be optimal due to an inadequate volume of blood received in culture bottles  ? Culture   Final  ?  NO GROWTH 4 DAYS ?Performed at [facility], [address] [place], [place] [address] ?  ? Report Status PENDING  Incomplete  ?Culture, blood (Routine X 2) w Reflex to ID Panel     Status: None (Preliminary result)  ? Collection Time: 10/07/21  6:52 AM  ? Specimen: BLOOD  ?Result Value Ref Range Status  ? Specimen Description BLOOD BLOOD LEFT FOREARM  Final  ? Special Requests   Final  ?   BOTTLES DRAWN AEROBIC AND ANAEROBIC Blood Culture adequate volume  ? Culture   Final  ?  NO GROWTH 4 DAYS ?Performed at [facility], [address], [place], [place] [address] ?  ? Report

## 2021-10-11 NOTE — TOC Progression Note (Signed)
Transition of Care (TOC) - Progression Note  ? ? ?Patient Details  ?Name: Gabriella Rogers ?MRN: [id number] ?Date of Birth: August 11, 1933 ? ?Transition of Care (TOC) CM/SW Contact  ?Bing Quarry, RN ?Phone Number: ?10/11/2021, 2:10 PM ? ?Clinical Narrative:  4/23: Spoke with daughter, Shon Hough at [phone] regarding current bed offers at [facility] and [facility] as no preference indicated. Daughter said family had already decided on [facility] as a preference due to closeness for family to be able to visit. Verbal permission to start insurance authorization and notified Tammy at [employer]. 215 pm. Gabriel Cirri RN CM  ? ?Shon Hough (Daughter)  ?[phone] (Mobile) ?Expected Discharge Plan: Skilled Nursing Facility ?Barriers to Discharge: Continued Medical Work up ? ?Expected Discharge Plan and Services ?Expected Discharge Plan: Skilled Nursing Facility ?  ?Discharge Planning Services: CM Consult ?  ?Living arrangements for the past 2 months: Single Family Home ?                ?DME Arranged: N/A ?DME Agency: NA ?  ?  ?  ?  ?  ?  ?  ?  ? ? ?Social Determinants of Health (SDOH) Interventions ?  ? ?Readmission Risk Interventions ?   ? View : No data to display.  ?  ?  ?  ? ? ?

## 2021-10-12 ENCOUNTER — Inpatient Hospital Stay: Payer: Medicare HMO

## 2021-10-12 DIAGNOSIS — I639 Cerebral infarction, unspecified: Secondary | ICD-10-CM | POA: Diagnosis not present

## 2021-10-12 LAB — CULTURE, BLOOD (ROUTINE X 2)
Culture: NO GROWTH
Culture: NO GROWTH
Special Requests: ADEQUATE

## 2021-10-12 LAB — CBC
HCT: 33.8 % — ABNORMAL LOW (ref 36.0–46.0)
Hemoglobin: 11 g/dL — ABNORMAL LOW (ref 12.0–15.0)
MCH: 33 pg (ref 26.0–34.0)
MCHC: 32.5 g/dL (ref 30.0–36.0)
MCV: 101.5 fL — ABNORMAL HIGH (ref 80.0–100.0)
Platelets: 380 10*3/uL (ref 150–400)
RBC: 3.33 MIL/uL — ABNORMAL LOW (ref 3.87–5.11)
RDW: 12.6 % (ref 11.5–15.5)
WBC: 10.1 10*3/uL (ref 4.0–10.5)
nRBC: 0 % (ref 0.0–0.2)

## 2021-10-12 LAB — URIC ACID: Uric Acid, Serum: 5.6 mg/dL (ref 2.5–7.1)

## 2021-10-12 MED ORDER — PANTOPRAZOLE SODIUM 40 MG PO TBEC
40.0000 mg | DELAYED_RELEASE_TABLET | Freq: Every day | ORAL | Status: DC
  Administered 2021-10-12 – 2021-10-14 (×3): 40 mg via ORAL
  Filled 2021-10-12 (×2): qty 1

## 2021-10-12 MED ORDER — CIPROFLOXACIN-DEXAMETHASONE 0.3-0.1 % OT SUSP
4.0000 [drp] | Freq: Two times a day (BID) | OTIC | Status: DC
  Administered 2021-10-12 – 2021-10-15 (×7): 4 [drp] via OTIC

## 2021-10-12 NOTE — Progress Notes (Signed)
Occupational Therapy Treatment ?Patient Details ?Name: Gabriella Rogers ?MRN: [id number] ?DOB: 09/25/1933 ?Today's Date: 10/12/2021 ? ? ?History of present illness Pt is an 88 year old female admitted with small acute ischemic stroke right thalamus and right central semiovale. PMH significant for mild cognitive impairment from home, prior stroke and 2021 on aspirin currently, hyperlipidemia brought to ER with weakness and difficulty getting her words out ?  ?OT comments ? Chart reviewed to date, Rn cleared pt for participation in OT tx session. Tx session targeted progressing strength/endurance in setting of ADL tasks to facilitate return to PLOF. Pt appears to present with increased confusion on this date. She is oriented to self, place, situation with significantly increased time required for processing. Increased time required for all one step directives with additional verbal and tactile cues required. MOD A required for supine>sit, MOD A required for SPT to bedside chair with step by step multi modal cueing required throughout. Grooming tasks completed with SET UP in sitting with vcs for attention to task. Discharge recommendation has been updated to STR as pt will continue to benefit form skilled OT to address functional deficits an dto facilitate return to PLOF. Team updated re: findings during tx session via secure chat. Pt is left in bedside chair, NAD, all needs met. OT will follow while admitted.   ? ?Recommendations for follow up therapy are one component of a multi-disciplinary discharge planning process, led by the attending physician.  Recommendations may be updated based on patient status, additional functional criteria and insurance authorization. ?   ?Follow Up Recommendations ? Skilled nursing-short term rehab (<3 hours/day)  ?  ?Assistance Recommended at Discharge Frequent or constant Supervision/Assistance  ?Patient can return home with the following ? Assistance with cooking/housework;Direct  supervision/assist for financial management;Assist for transportation;Direct supervision/assist for medications management;A lot of help with walking and/or transfers;A lot of help with bathing/dressing/bathroom ?  ?Equipment Recommendations ? None recommended by OT  ?  ?Recommendations for Other Services   ? ?  ?Precautions / Restrictions Precautions ?Precautions: Fall ?Restrictions ?Weight Bearing Restrictions: No  ? ? ?  ? ?Mobility Bed Mobility ?Overal bed mobility: Needs Assistance ?Bed Mobility: Supine to Sit ?  ?  ?Supine to sit: Mod assist, HOB elevated ?  ?  ?  ?  ? ?Transfers ?Overall transfer level: Needs assistance ?Equipment used: Rolling walker (2 wheels) ?Transfers: Sit to/from Stand ?Sit to Stand: Mod assist ?  ?  ?  ?  ?  ?General transfer comment: step by step vcs ?  ?  ?Balance Overall balance assessment: Needs assistance ?Sitting-balance support: Feet supported, Bilateral upper extremity supported ?Sitting balance-Leahy Scale: Fair ?Sitting balance - Comments: posterior lean ?  ?Standing balance support: Reliant on assistive device for balance, During functional activity, Bilateral upper extremity supported ?Standing balance-Leahy Scale: Poor ?  ?  ?  ?  ?  ?  ?  ?  ?  ?  ?  ?  ?   ? ?ADL either performed or assessed with clinical judgement  ? ?ADL Overall ADL's : Needs assistance/impaired ?Eating/Feeding: Set up;Sitting;Cueing for sequencing ?Eating/Feeding Details (indicate cue type and reason): intermittent vcs for initiation ?Grooming: Wash/dry hands;Wash/dry face;Sitting;Set up ?  ?  ?  ?  ?  ?  ?  ?Lower Body Dressing: Maximal assistance;Bed level ?Lower Body Dressing Details (indicate cue type and reason): socks due to pt R  knee pain ?Toilet Transfer: Moderate assistance;Stand-pivot;Rolling walker (2 wheels);Cueing for safety;Cueing for sequencing ?Toilet Transfer Details (indicate cue   type and reason): simulated to bedside chair ?  ?  ?  ?  ?Functional mobility during ADLs: Moderate  assistance;Rolling walker (2 wheels) (able to take 3-4 steps then requests to sit due to pain) ?  ?  ? ?Extremity/Trunk Assessment   ?  ?  ?  ?  ?  ? ?Vision   ?  ?  ?Perception   ?  ?Praxis   ?  ? ?Cognition Arousal/Alertness: Awake/alert ?Behavior During Therapy: Flat affect ?Overall Cognitive Status: Impaired/Different from baseline ?Area of Impairment: Orientation, Attention, Memory, Following commands, Safety/judgement, Awareness ?  ?  ?  ?  ?  ?  ?  ?  ?Orientation Level: Disoriented to, Time ?Current Attention Level: Sustained ?Memory: Decreased recall of precautions, Decreased short-term memory ?Following Commands: Follows one step commands with increased time ?Safety/Judgement: Decreased awareness of safety, Decreased awareness of deficits ?Awareness: Emergent ?Problem Solving: Slow processing, Decreased initiation, Requires verbal cues, Requires tactile cues ?General Comments: pt presents with increased confusion as compared to evaluation ?  ?  ?   ?Exercises   ? ?  ?Shoulder Instructions   ? ? ?  ?General Comments Pt reports feeling weak and appears to present with increased confusion as compared to previous tx session  ? ? ?Pertinent Vitals/ Pain       Pain Assessment ?Pain Assessment: Faces ?Faces Pain Scale: Hurts whole lot ?Pain Location: R knee ?Pain Descriptors / Indicators: Grimacing, Guarding ?Pain Intervention(s): Limited activity within patient's tolerance, Repositioned, Patient requesting pain meds-RN notified ? ?Home Living   ?  ?  ?  ?  ?  ?  ?  ?  ?  ?  ?  ?  ?  ?  ?  ?  ?  ?  ? ?  ?Prior Functioning/Environment    ?  ?  ?  ?   ? ?Frequency ? Min 3X/week  ? ? ? ? ?  ?Progress Toward Goals ? ?OT Goals(current goals can now be found in the care plan section) ? Progress towards OT goals: Progressing toward goals ? ?Acute Rehab OT Goals ?Patient Stated Goal: feel better ?OT Goal Formulation: With patient ?Time For Goal Achievement: 10/30/21 ?Potential to Achieve Goals: Good  ?Plan Discharge plan  needs to be updated   ? ?Co-evaluation ? ? ?   ?  ?  ?  ?  ? ?  ?AM-PAC OT "6 Clicks" Daily Activity     ?Outcome Measure ? ? Help from another person eating meals?: A Little ?Help from another person taking care of personal grooming?: A Little ?Help from another person toileting, which includes using toliet, bedpan, or urinal?: A Little ?Help from another person bathing (including washing, rinsing, drying)?: A Lot ?Help from another person to put on and taking off regular upper body clothing?: A Little ?Help from another person to put on and taking off regular lower body clothing?: A Lot ?6 Click Score: 16 ? ?  ?End of Session Equipment Utilized During Treatment: Rolling walker (2 wheels) ? ?OT Visit Diagnosis: Other symptoms and signs involving the nervous system (R29.898);Muscle weakness (generalized) (M62.81);Unsteadiness on feet (R26.81) ?  ?Activity Tolerance Patient limited by pain ?  ?Patient Left in chair;with call bell/phone within reach;with chair alarm set ?  ?Nurse Communication Mobility status;Other (comment) (team notified re: change in functional status, R knee pain) ?  ? ?   ? ?Time: 0855-0915 ?OT Time Calculation (min): 20 min ? ?Charges: OT General Charges ?$OT Visit: 1 Visit ?OT Treatments ?$  Self Care/Home Management : 8-22 mins ? ? , OTD OTR/L  ?10/12/21, 10:43 AM  ?

## 2021-10-12 NOTE — TOC Progression Note (Signed)
Transition of Care (TOC) - Progression Note  ? ? ?Patient Details  ?Name: Gabriella Rogers ?MRN: [id number] ?Date of Birth: 1934-03-15 ? ?Transition of Care (TOC) CM/SW Contact  ?Caryn Section, RN ?Phone Number: ?10/12/2021, 9:40 AM ? ?Clinical Narrative:   As per Tammy at [facility], she has started authorization for patient to come to facility.  She will call when Berkley Harvey comes in. ? ? ? ?Expected Discharge Plan: Skilled Nursing Facility ?Barriers to Discharge: Continued Medical Work up ? ?Expected Discharge Plan and Services ?Expected Discharge Plan: Skilled Nursing Facility ?  ?Discharge Planning Services: CM Consult ?  ?Living arrangements for the past 2 months: Single Family Home ?                ?DME Arranged: N/A ?DME Agency: NA ?  ?  ?  ?  ?  ?  ?  ?  ? ? ?Social Determinants of Health (SDOH) Interventions ?  ? ?Readmission Risk Interventions ?   ? View : No data to display.  ?  ?  ?  ? ? ?

## 2021-10-12 NOTE — Progress Notes (Addendum)
Pt with bleeding. 08/13/21 per off going day shift report report bloody drainage right ear. 08/14/2021 aprox. 0615 bloody drainage from left ear observed when turning pt onto left side. NP, Bishop Limbo notified and cam bedside to assess. 10/12/2021 1920 per bedside shift report pt presented with nosebleed during day shift and had compliant of headache. Review of chart revealed last CBC 4/19 1805. Pt started on Plavix 4/19.  Aprox. 1930 today NP, Bishop Limbo notified face to face of new occurrence of nosebleed and headache during day shift and last CBC results. Per NP, blood work to be ordered. Order placed and acknowledged 2001 10/12/2021. ?

## 2021-10-12 NOTE — Progress Notes (Signed)
PT Cancellation Note ? ?Patient Details ?Name: Gabriella Rogers ?MRN: [id number] ?DOB: 11-Aug-1933 ? ? ?Cancelled Treatment:    Reason Eval/Treat Not Completed: Pain limiting ability to participate. Upon entry to room, pt upright in recliner. She is declining participation at this moment due to increased R knee pain despite encouragement. Pt repositioned in attempts to improve R knee pain. Will re-attempt at later time/date as able. ? ? ?Delphia Grates. Fairly IV, PT, DPT ?Physical Therapist- [place]  ?[facility]  ?10/12/2021, 9:35 AM ?

## 2021-10-12 NOTE — Care Management Important Message (Signed)
Important Message ? ?Patient Details  ?Name: Gabriella Rogers ?MRN: [id number] ?Date of Birth: [birth year] ? ? ?Medicare Important Message Given:  Yes ? ? ? ? ?Gabriella Rogers ?10/12/2021, 12:28 PM ?

## 2021-10-12 NOTE — Progress Notes (Signed)
Physical Therapy Treatment ?Patient Details ?Name: Gabriella Rogers ?MRN: [id number] ?DOB: 1933-11-08 ?Today's Date: 10/12/2021 ? ? ?History of Present Illness Pt is an 88 year old female admitted with small acute ischemic stroke right thalamus and right central semiovale. PMH significant for mild cognitive impairment from home, prior stroke and 2021 on aspirin currently, hyperlipidemia brought to ER with weakness and difficulty getting her words out ? ?  ?PT Comments  ? ? Pt received in recliner. Agreeable to PT services denying any R knee pain compared to earlier in the morning. Pt requiring increased time, max multimodal cuing and multiple attempts to stand to RW requiring heavy modA for x3 standing prior to pt able to successfully achieve upright standing without posterior lean. Pt performing step pivot transfer from recliner to bed and bed to recliner with max multimodal cuing for LE sequencing and RW sequencing and modA at pt for significant balance and LE weakness impairments. From bed to recliner pt does demonstrate improved speed and technique with transfer with LE movement but still requires modA and min multimodal cues for RW use. PT did not attempt gait this date as pt demonstrating high risk of falls with step pivot transfers requiring heavy physical assist and cues for safe use of RW. D/c recs remain appropriate due to aforementioned deficits.  ?    ?Recommendations for follow up therapy are one component of a multi-disciplinary discharge planning process, led by the attending physician.  Recommendations may be updated based on patient status, additional functional criteria and insurance authorization. ? ?Follow Up Recommendations ? Skilled nursing-short term rehab (<3 hours/day) ?  ?  ?Assistance Recommended at Discharge Frequent or constant Supervision/Assistance  ?Patient can return home with the following Direct supervision/assist for financial management;Direct supervision/assist for medications  management;A lot of help with bathing/dressing/bathroom;A little help with walking and/or transfers;Help with stairs or ramp for entrance;Assistance with cooking/housework;Assist for transportation ?  ?Equipment Recommendations ? Rolling walker (2 wheels)  ?  ?Recommendations for Other Services   ? ? ?  ?Precautions / Restrictions Precautions ?Precautions: Fall ?Restrictions ?Weight Bearing Restrictions: No  ?  ? ?Mobility ? Bed Mobility ?  ?  ?  ?  ?  ?  ?  ?  ?Patient Response: Cooperative ? ?Transfers ?Overall transfer level: Needs assistance ?Equipment used: Rolling walker (2 wheels) ?Transfers: Bed to chair/wheelchair/BSC ?  ?  ?Step pivot transfers: Mod assist ?  ?  ?  ?General transfer comment: step by step vcs and increased time to perform ?  ? ?Ambulation/Gait ?  ?  ?  ?  ?  ?  ?  ?General Gait Details: deferred due to significant difficulty in step pivot transfer ? ? ?Stairs ?  ?  ?  ?  ?  ? ? ?Wheelchair Mobility ?  ? ?Modified Rankin (Stroke Patients Only) ?  ? ? ?  ?Balance Overall balance assessment: Needs assistance ?Sitting-balance support: Feet supported, Bilateral upper extremity supported ?Sitting balance-Leahy Scale: Fair ?  ?Postural control: Posterior lean ?Standing balance support: Reliant on assistive device for balance, During functional activity, Bilateral upper extremity supported ?Standing balance-Leahy Scale: Poor ?  ?  ?  ?  ?  ?  ?  ?  ?  ?  ?  ?  ?  ? ?  ?Cognition Arousal/Alertness: Awake/alert ?Behavior During Therapy: Flat affect ?Overall Cognitive Status: Impaired/Different from baseline ?  ?  ?  ?  ?  ?  ?  ?  ?  ?  ?  ?  ?  Following Commands: Follows one step commands with increased time ?Safety/Judgement: Decreased awareness of safety, Decreased awareness of deficits ?  ?Problem Solving: Slow processing, Decreased initiation, Requires verbal cues, Requires tactile cues ?  ?  ?  ? ?  ?Exercises General Exercises - Lower Extremity ?Ankle Circles/Pumps: AROM, Strengthening, Both,  10 reps, Seated ?Long Arc Quad: AROM, Strengthening, Both, 10 reps, Seated ?Other Exercises ?Other Exercises: Education on pelvis and LE placement to improve ease of standing transfers ? ?  ?General Comments General comments (skin integrity, edema, etc.): Pt reports feeling weak and appears to present with increased confusion as compared to previous tx session ?  ?  ? ?Pertinent Vitals/Pain Pain Assessment ?Pain Assessment: No/denies pain  ? ? ?Home Living   ?  ?  ?  ?  ?  ?  ?  ?  ?  ?   ?  ?Prior Function    ?  ?  ?   ? ?PT Goals (current goals can now be found in the care plan section) Acute Rehab PT Goals ?Patient Stated Goal: to get better ?PT Goal Formulation: With patient ?Time For Goal Achievement: 10/21/21 ?Potential to Achieve Goals: Fair ?Progress towards PT goals: Not progressing toward goals - comment (heavy physical assist with step pivot transfers, unsafe to attempt gait) ? ?  ?Frequency ? ? ? 7X/week ? ? ? ?  ?PT Plan Current plan remains appropriate  ? ? ?Co-evaluation   ?  ?  ?  ?  ? ?  ?AM-PAC PT "6 Clicks" Mobility   ?Outcome Measure ? Help needed turning from your back to your side while in a flat bed without using bedrails?: A Little ?Help needed moving from lying on your back to sitting on the side of a flat bed without using bedrails?: A Little ?Help needed moving to and from a bed to a chair (including a wheelchair)?: A Lot ?Help needed standing up from a chair using your arms (e.g., wheelchair or bedside chair)?: A Lot ?Help needed to walk in hospital room?: Total ?Help needed climbing 3-5 steps with a railing? : Total ?6 Click Score: 12 ? ?  ?End of Session Equipment Utilized During Treatment: Gait belt ?Activity Tolerance: Other (comment) (pt unable to desribe, but displaying significant difficulty in mobility compared to previous PT notes) ?Patient left: with chair alarm set;in chair;with call bell/phone within reach ?Nurse Communication: Mobility status ?PT Visit Diagnosis: Unsteadiness  on feet (R26.81);History of falling (Z91.81);Other abnormalities of gait and mobility (R26.89);Other symptoms and signs involving the nervous system (R29.898) ?  ? ? ?Time: 3295-1884 ?PT Time Calculation (min) (ACUTE ONLY): 19 min ? ?Charges:  $Therapeutic Activity: 8-22 mins          ?          ? ?Delphia Grates. Fairly IV, PT, DPT ?Physical Therapist- [place]  ?[facility]  ?10/12/2021, 12:26 PM ? ?

## 2021-10-12 NOTE — Progress Notes (Signed)
?PROGRESS NOTE ? ? ? ?Gabriella HopperSylvia C Rogers  YNW:[id number] DOB: 05/14/1934 DOA: 10/06/2021 ?PCP: Corky DownsMasoud, Javed, MD  ? ?Brief Narrative:  ?88 year old with history of mild cognitive impairment from home, prior stroke and 2021 on aspirin currently, hyperlipidemia brought to ER with weakness and difficulty getting her words out.  In the emergency room, hemodynamically stable.  No focal neurological deficit.  Patient was found to have small acute ischemic stroke right thalamus and right central semiovale.  She was also found with febrile episode.  Eventually no signs of infections were noted therefore she was kept off antibiotics.  She was also seen by neurology for acute CVA and she was started on aspirin and Plavix indefinitely.  Work-up including CTA head and neck and 2D echocardiogram was overall unremarkable. ?PT recommended SNF. ? ? ?Assessment & Plan: ? Principal Problem: ?  Acute CVA (cerebrovascular accident) (HCC) ?Active Problems: ?  AKI (acute kidney injury) (HCC) ?  Hypokalemia ?  Macrocytic anemia ?  Leukocytosis ?  Cervical dystonia ?  Depression ?  Mild cognitive impairment ?  Generalized weakness ?  Stroke (cerebrum) (HCC) ?  ? ? ?Assessment and Plan: ?* Acute CVA (cerebrovascular accident) (HCC) ?-MRI of the brain is consistent with acute CVA, patient not a candidate for tPA.  LDL noted to be 80.  She was seen by neurology who recommended aspirin and Plavix indefinitely along with statin. Will start PPI as well.  ? ?Right knee pain  ?- Uric acid normal.  X-ray shows advanced osteoarthritis.  No evidence of effusion.  Can place knee brace. ?Stays swollen per daughter, its at baseline.. ? ?B/L Ear discharge  ?-Spoke with Dr Jenne CampusMcQueen ENT - recommended Ciprodex x 7 days and outptn follow up  ? ? ?Hypokalemia ?Replete as necessary ? ?AKI (acute kidney injury) (HCC) ?Resolved ? ?Leukocytosis ?Stable, no evidence of infection. ? ?Macrocytic anemia ?Follow anemia profile ? ?Generalized weakness ?Secondary to underlying  stroke.  PT/OT recommended SNF ? ?Mild cognitive impairment ?Continue donepezil ? ?Cervical dystonia ?Receives Botox by [facility] neurology ?Continue clonazepam as needed ? ? ? ? ?  ? ? ? ?DVT prophylaxis: enoxaparin (LOVENOX) injection 40 mg Start: 10/08/21 1020 ? ?Code Status: DNR ?Family Communication:  Butler DenmarkCalled Julie- she is upto date ? ?Status is: Inpatient ?Remains inpatient appropriate because: Currently awaiting SNF bed ? ? ? ? ?Subjective: ?Doing ok no complaints.  ?Had some discharge frm her ears overnight.  ? ?Constitutional: Not in acute distress ?Respiratory: Clear to auscultation bilaterally ?Cardiovascular: Normal sinus rhythm, no rubs ?Abdomen: Nontender nondistended good bowel sounds ?Musculoskeletal: Right knee more swollen than left, per daughter this is chronic.  Limited flexion ?Skin: No rashes seen ?Neurologic: CN 2-12 grossly intact.  And nonfocal ?Psychiatric: Alert to name only.  Overall poor judgment and insight. ? ?Objective: ?Vitals:  ? 10/06/21 2335 10/12/21 0448 10/12/21 0730 10/12/21 0803  ?BP:  (!) 153/81 117/62 132/70  ?Pulse:  76 84 80  ?Resp:  18 17 18   ?Temp:  97.9 ?F (36.6 ?C) 98.1 ?F (36.7 ?C) 98.3 ?F (36.8 ?C)  ?TempSrc:  Oral Oral Oral  ?SpO2:  100% 100% 100%  ?Weight: 54.5 kg     ? ? ?Intake/Output Summary (Last 24 hours) at 10/12/2021 1139 ?Last data filed at 10/11/2021 1300 ?Gross per 24 hour  ?Intake 240 ml  ?Output --  ?Net 240 ml  ? ?Filed Weights  ? 10/06/21 2335  ?Weight: 54.5 kg  ? ? ? ?Data Reviewed:  ? ?CBC: ?Recent Labs  ?Lab  10/07/21 ?0006 10/07/21 ?1805  ?WBC 12.2* 12.0*  ?NEUTROABS 10.7* 10.3*  ?HGB 11.9* 13.5  ?HCT 35.7* 40.8  ?MCV 101.7* 103.6*  ?PLT 305 263  ? ?Basic Metabolic Panel: ?Recent Labs  ?Lab 10/07/21 ?0006 10/08/21 ?0501  ?NA 137 138  ?K 3.0* 3.9  ?CL 102 110  ?CO2 26 24  ?GLUCOSE 120* 94  ?BUN 19 11  ?CREATININE 1.21* 0.68  ?CALCIUM 8.7* 8.3*  ? ?GFR: ?CrCl cannot be calculated (Unknown ideal weight.). ?Liver Function Tests: ?Recent Labs  ?Lab  10/07/21 ?0006  ?AST 36  ?ALT 14  ?ALKPHOS 86  ?BILITOT 1.1  ?PROT 7.6  ?ALBUMIN 3.6  ? ?No results for input(s): LIPASE, AMYLASE in the last 168 hours. ?No results for input(s): AMMONIA in the last 168 hours. ?Coagulation Profile: ?Recent Labs  ?Lab 10/07/21 ?0006  ?INR 1.1  ? ?Cardiac Enzymes: ?No results for input(s): CKTOTAL, CKMB, CKMBINDEX, TROPONINI in the last 168 hours. ?BNP (last 3 results) ?No results for input(s): PROBNP in the last 8760 hours. ?HbA1C: ?No results for input(s): HGBA1C in the last 72 hours. ?CBG: ?Recent Labs  ?Lab 10/06/21 ?2335  ?GLUCAP 131*  ? ?Lipid Profile: ?No results for input(s): CHOL, HDL, LDLCALC, TRIG, CHOLHDL, LDLDIRECT in the last 72 hours. ?Thyroid Function Tests: ?No results for input(s): TSH, T4TOTAL, FREET4, T3FREE, THYROIDAB in the last 72 hours. ?Anemia Panel: ?No results for input(s): VITAMINB12, FOLATE, FERRITIN, TIBC, IRON, RETICCTPCT in the last 72 hours. ?Sepsis Labs: ?Recent Labs  ?Lab 10/07/21 ?0718 10/08/21 ?0501  ?PROCALCITON 0.12 0.75  ?LATICACIDVEN 0.9  --   ? ? ?Recent Results (from the past 240 hour(s))  ?Resp Panel by RT-PCR (Flu A&B, Covid) Nasopharyngeal Swab     Status: None  ? Collection Time: 10/07/21 12:58 AM  ? Specimen: Nasopharyngeal Swab; Nasopharyngeal(NP) swabs in vial transport medium  ?Result Value Ref Range Status  ? SARS Coronavirus 2 by RT PCR NEGATIVE NEGATIVE Final  ?  Comment: (NOTE) ?SARS-CoV-2 target nucleic acids are NOT DETECTED. ? ?The SARS-CoV-2 RNA is generally detectable in upper respiratory ?specimens during the acute phase of infection. The lowest ?concentration of SARS-CoV-2 viral copies this assay can detect is ?138 copies/mL. A negative result does not preclude SARS-Cov-2 ?infection and should not be used as the sole basis for treatment or ?other patient management decisions. A negative result may occur with  ?improper specimen collection/handling, submission of specimen other ?than nasopharyngeal swab, presence of viral  mutation(s) within the ?areas targeted by this assay, and inadequate number of viral ?copies(<138 copies/mL). A negative result must be combined with ?clinical observations, patient history, and epidemiological ?information. The expected result is Negative. ? ?Fact Sheet for Patients:  ?BloggerCourse.com ? ?Fact Sheet for Healthcare Providers:  ?SeriousBroker.it ? ?This test is no t yet approved or cleared by the [place] FDA and  ?has been authorized for detection and/or diagnosis of SARS-CoV-2 by ?FDA under an Emergency Use Authorization (EUA). This EUA will remain  ?in effect (meaning this test can be used) for the duration of the ?COVID-19 declaration under Section 564(b)(1) of the Act, 21 ?U.S.C.section 360bbb-3(b)(1), unless the authorization is terminated  ?or revoked sooner.  ? ? ?  ? Influenza A by PCR NEGATIVE NEGATIVE Final  ? Influenza B by PCR NEGATIVE NEGATIVE Final  ?  Comment: (NOTE) ?The Xpert Xpress SARS-CoV-2/FLU/RSV plus assay is intended as an aid ?in the diagnosis of influenza from Nasopharyngeal swab specimens and ?should not be used as a sole basis for treatment. Nasal washings  and ?aspirates are unacceptable for Xpert Xpress SARS-CoV-2/FLU/RSV ?testing. ? ?Fact Sheet for Patients: ?BloggerCourse.com ? ?Fact Sheet for Healthcare Providers: ?SeriousBroker.it ? ?This test is not yet approved or cleared by the [place] FDA and ?has been authorized for detection and/or diagnosis of SARS-CoV-2 by ?FDA under an Emergency Use Authorization (EUA). This EUA will remain ?in effect (meaning this test can be used) for the duration of the ?COVID-19 declaration under Section 564(b)(1) of the Act, 21 U.S.C. ?section 360bbb-3(b)(1), unless the authorization is terminated or ?revoked. ? ?Performed at [facility], [address] [place], ?[place] [address] ?  ?Culture, blood (Routine X 2) w  Reflex to ID Panel     Status: None  ? Collection Time: 10/07/21  6:52 AM  ? Specimen: BLOOD  ?Result Value Ref Range Status  ? Specimen Description BLOOD BLOOD RIGHT HAND  Final  ? Special Requests   Final  ?  BOT

## 2021-10-13 ENCOUNTER — Encounter: Payer: Self-pay | Admitting: Internal Medicine

## 2021-10-13 DIAGNOSIS — I639 Cerebral infarction, unspecified: Secondary | ICD-10-CM | POA: Diagnosis not present

## 2021-10-13 MED ORDER — AMLODIPINE BESYLATE 5 MG PO TABS: 5.0000 mg | ORAL_TABLET | Freq: Every day | ORAL | Status: DC

## 2021-10-13 MED ORDER — CIPROFLOXACIN-DEXAMETHASONE 0.3-0.1 % OT SUSP: 4.0000 [drp] | Freq: Two times a day (BID) | OTIC | 0 refills | Status: AC

## 2021-10-13 MED ORDER — POLYETHYLENE GLYCOL 3350 17 G PO PACK: 17.0000 g | PACK | Freq: Every day | ORAL | 0 refills | Status: AC | PRN

## 2021-10-13 MED ORDER — PANTOPRAZOLE SODIUM 40 MG PO TBEC: 40.0000 mg | DELAYED_RELEASE_TABLET | Freq: Every day | ORAL | Status: DC

## 2021-10-13 MED ORDER — ASPIRIN 81 MG PO TBEC: 81.0000 mg | DELAYED_RELEASE_TABLET | Freq: Every day | ORAL | 11 refills | Status: AC

## 2021-10-13 NOTE — Discharge Summary (Addendum)
Physician Discharge Summary  ?Gabriella Rogers KKX:[id number] DOB: [birth year] DOA: 10/06/2021 ? ?PCP: Corky Downs, MD ? ?Admit date: 10/06/2021 ?Discharge date: 10/13/2021 ? ?Admitted From: Home ?Disposition:  SNF ? ?Recommendations for Outpatient Follow-up:  ?Follow up with PCP in 1-2 weeks ?Please obtain BMP/CBC in one week your next doctors visit.  ?ASA/Plavix indefinitely per neurology. Will also add PPI daily. Cont Lipitor.  ?Follow up with [facility] with Dr Linus Salmons 1-2 days post discharge for some discharge and bleeding from ears (likely superficial) ?CiproDex prescribed, last day 4/29 ?Right Knee Brace ordered, Can follow up ouptn Orthopedic in 2-4 weeks times.  ?Outpatient neurology follow-up in next 3-4 weeks ?At times can use Klonopin BID prn if needed.  ? ? ?Discharge Condition: Stable ?CODE STATUS: DNR ?Diet recommendation: Hearth healthy.  ? ?Brief/Interim Summary: ?88 year old with history of mild cognitive impairment from home, prior stroke and 2021 on aspirin currently, hyperlipidemia brought to ER with weakness and difficulty getting her words out.  In the emergency room, hemodynamically stable.  No focal neurological deficit.  Patient was found to have small acute ischemic stroke right thalamus and right central semiovale.  She was also found with febrile episode.  Eventually no signs of infections were noted therefore she was kept off antibiotics.  She was also seen by neurology for acute CVA and she was started on aspirin and Plavix indefinitely.  Work-up including CTA head and neck and 2D echocardiogram was overall unremarkable. ?PT recommended SNF. ?  ?Gabriella Rogers updated today ?  ?Assessment & Plan: ? Principal Problem: ?  Acute CVA (cerebrovascular accident) (HCC) ?Active Problems: ?  AKI (acute kidney injury) (HCC) ?  Hypokalemia ?  Macrocytic anemia ?  Leukocytosis ?  Cervical dystonia ?  Depression ?  Mild cognitive impairment ?  Generalized weakness ?  Stroke (cerebrum) (HCC) ?  ?  ?   ?Assessment and Plan: ?* Acute CVA (cerebrovascular accident) (HCC) ?-MRI of the brain is consistent with acute CVA, patient not a candidate for tPA.  LDL noted to be 80.  She was seen by neurology who recommended aspirin and Plavix indefinitely along with statin.  Daily PPI has been added.  Follow-up outpatient neurology next 3-4 weeks ?  ?Right knee pain  ?- Uric acid normal.  X-ray shows advanced osteoarthritis.  No evidence of effusion.  Can place knee brace.  Follow-up outpatient orthopedic in next 3-4 weeks ?Stays swollen per daughter, its at baseline.. ?  ?B/L Ear discharge  ?-Spoke with Dr Jenne Campus [facility] - recommended Ciprodex x 7 days and outptn follow up 1-2 days postdischarge ?  ?  ?Hypokalemia ?Replete as necessary ?  ?AKI (acute kidney injury) (HCC) ?Resolved ?  ?Leukocytosis ?Stable, no evidence of infection. ?  ?Macrocytic anemia ?Follow anemia profile ?  ?Generalized weakness ?Secondary to underlying stroke.  PT/OT recommended SNF ?  ?Mild cognitive impairment ?Continue donepezil ?  ?Cervical dystonia ?Receives Botox by [facility] neurology ? ? ?Discharge Diagnoses:  ?Principal Problem: ?  Acute CVA (cerebrovascular accident) (HCC) ?Active Problems: ?  AKI (acute kidney injury) (HCC) ?  Hypokalemia ?  Macrocytic anemia ?  Leukocytosis ?  Cervical dystonia ?  Depression ?  Mild cognitive impairment ?  Generalized weakness ?  Stroke (cerebrum) (HCC) ? ? ? ? ? ?Subjective: ?Feels ok, no new complaints.  ? ?Discharge Exam: ?Vitals:  ? 10/13/21 0530 10/13/21 0734  ?BP: 134/68 (!) 154/71  ?Pulse: 88 79  ?Resp: 18   ?Temp: 98.6 ?F (37 ?C) 97.9 ?F (36.6 ?  C)  ?SpO2: 94% 95%  ? ?Vitals:  ? 10/12/21 1630 10/12/21 1945 10/13/21 0530 10/13/21 0734  ?BP: (!) 116/45 127/60 134/68 (!) 154/71  ?Pulse: 76 79 88 79  ?Resp: 18 16 18    ?Temp: 97.6 ?F (36.4 ?C) 97.8 ?F (36.6 ?C) 98.6 ?F (37 ?C) 97.9 ?F (36.6 ?C)  ?TempSrc: Oral Oral Oral   ?SpO2: 97% 99% 94% 95%  ?Weight:      ? ? ?General: Pt is alert, awake, not in acute  distress. Elderly frail. Dried blood outside her ear but no active drainage.  ?Cardiovascular: RRR, S1/S2 +, no rubs, no gallops ?Respiratory: CTA bilaterally, no wheezing, no rhonchi ?Abdominal: Soft, NT, ND, bowel sounds + ?Extremities: no edema, no cyanosis ? ?Discharge Instructions ? ? ?Allergies as of 10/13/2021   ? ?   Reactions  ? Actonel  [risedronate Sodium]   ? Piper   ? Pneumococcal Vaccine Other (See Comments)  ? WEAKNESS, DISORIENTED, "ARM SORE, RED, HURTING"  ? Risedronate Other (See Comments)  ? REDNESS IN FACE  ? Alendronate Rash  ? Aleve [naproxen Sodium] Rash  ? Pravastatin Rash  ? ?  ? ?  ?Medication List  ?  ? ?STOP taking these medications   ? ?aspirin 325 MG tablet ?Replaced by: aspirin 81 MG EC tablet ?  ?clonazePAM 0.5 MG tablet ?Commonly known as: KLONOPIN ?  ? ?  ? ?TAKE these medications   ? ?acetaminophen 325 MG tablet ?Commonly known as: TYLENOL ?Take 650 mg by mouth every 6 (six) hours as needed. ?  ?amLODipine 5 MG tablet ?Commonly known as: NORVASC ?Take 1 tablet (5 mg total) by mouth daily. ?Start taking on: October 14, 2021 ?  ?aspirin 81 MG EC tablet ?Take 1 tablet (81 mg total) by mouth daily. Swallow whole. ?Start taking on: October 14, 2021 ?Replaces: aspirin 325 MG tablet ?  ?atorvastatin 80 MG tablet ?Commonly known as: LIPITOR ?Take 1 tablet (80 mg total) by mouth daily at 6 PM. ?  ?ciprofloxacin-dexamethasone OTIC suspension ?Commonly known as: CIPRODEX ?Place 4 drops into both ears 2 (two) times daily for 4 days. ?  ?clopidogrel 75 MG tablet ?Commonly known as: PLAVIX ?Take 1 tablet (75 mg total) by mouth daily. ?  ?donepezil 10 MG tablet ?Commonly known as: ARICEPT ?Take 1 tablet (10 mg total) by mouth at bedtime. ?  ?famotidine 20 MG tablet ?Commonly known as: PEPCID ?Take 20 mg by mouth 2 (two) times daily. ?  ?ONE-A-DAY WOMENS PO ?Take 1 tablet by mouth daily. ?  ?pantoprazole 40 MG tablet ?Commonly known as: PROTONIX ?Take 1 tablet (40 mg total) by mouth daily. ?Start  taking on: October 14, 2021 ?  ?PARoxetine 10 MG tablet ?Commonly known as: PAXIL ?Take 10 mg by mouth daily. ?  ?polyethylene glycol 17 g packet ?Commonly known as: MIRALAX / GLYCOLAX ?Take 17 g by mouth daily as needed for moderate constipation or severe constipation. ?  ? ?  ? ? Follow-up Information   ? ? Linus SalmonsMcQueen, Chapman, MD. Schedule an appointment as soon as possible for a visit in 1 day(s).   ?Specialty: Otolaryngology ?Why: 1-2 days post hospital discharge. ?Contact information: ?[address] ?[address] ?[place] [phone] ?[phone] ? ? ?  ?  ? ?  ?  ? ?  ? ?Allergies  ?Allergen Reactions  ? Actonel  [Risedronate Sodium]   ? Piper   ? Pneumococcal Vaccine Other (See Comments)  ?  WEAKNESS, DISORIENTED, "ARM SORE, RED, HURTING"  ?  Risedronate Other (See Comments)  ?  REDNESS IN FACE  ? Alendronate Rash  ? Aleve [Naproxen Sodium] Rash  ? Pravastatin Rash  ? ? ?You were cared for by a hospitalist during your hospital stay. If you have any questions about your discharge medications or the care you received while you were in the hospital after you are discharged, you can call the unit and asked to speak with the hospitalist on call if the hospitalist that took care of you is not available. Once you are discharged, your primary care physician will handle any further medical issues. Please note that no refills for any discharge medications will be authorized once you are discharged, as it is imperative that you return to your primary care physician (or establish a relationship with a primary care physician if you do not have one) for your aftercare needs so that they can reassess your need for medications and monitor your lab values. ? ? ?Procedures/Studies: ?CT ANGIO HEAD NECK W WO CM ? ?Result Date: 10/08/2021 ?CLINICAL DATA:  Stroke follow-up EXAM: CT ANGIOGRAPHY HEAD AND NECK TECHNIQUE: Multidetector CT imaging of the head and neck was performed using the standard protocol during bolus  administration of intravenous contrast. Multiplanar CT image reconstructions and MIPs were obtained to evaluate the vascular anatomy. Carotid stenosis measurements (when applicable) are obtained utilizing NASCET criteria,

## 2021-10-13 NOTE — Progress Notes (Signed)
Physical Therapy Treatment ?Patient Details ?Name: Gabriella Rogers ?MRN: [id number] ?DOB: 01-Dec-1933 ?Today's Date: 10/13/2021 ? ? ?History of Present Illness Pt is an 88 year old female admitted with small acute ischemic stroke right thalamus and right central semiovale. PMH significant for mild cognitive impairment from home, prior stroke and 2021 on aspirin currently, hyperlipidemia brought to ER with weakness and difficulty getting her words out ? ?  ?PT Comments  ? ? Pt received upright in bed, noted soiled bed in urine. Pt appears to be unaware of urine in bed. Agreeable to PT. Overall supervision and increased time with HOB elevated to reach Eob but then relying on modA to scoot to EOB for feet to reach floor. Noted posterior lean and L lateral lean sitting EOB with pt able to correct with min multimodal cues. Soiled gown doffed and clean gown donned with pt requiring max set up and min VC's for sequencing UE's into sleeves correctly.  Pt requiring modA to stand step transfer to recliner with mod VC's for hand placement and LE set up to improve ease of standing. Pt improving speed of transfer this date however remains significantly kyphotic, modA on gait belt and limited foot clearance with minA on RW for coordinating RW with steps. Poor eccentric control noted with sitting with poor hand placement and safety with RW despite max VC's. All needs in reach. Further mobility deferred as pt remains unsafe and still displaying significant difficulty with safe completion of transfer. D/c recs remain appropriate as pt appears to be further deconditioned compared to day of evaluation.  ?  ?Recommendations for follow up therapy are one component of a multi-disciplinary discharge planning process, led by the attending physician.  Recommendations may be updated based on patient status, additional functional criteria and insurance authorization. ? ?Follow Up Recommendations ? Skilled nursing-short term rehab (<3 hours/day) ?   ?  ?Assistance Recommended at Discharge Frequent or constant Supervision/Assistance  ?Patient can return home with the following Direct supervision/assist for financial management;Direct supervision/assist for medications management;A lot of help with bathing/dressing/bathroom;A little help with walking and/or transfers;Help with stairs or ramp for entrance;Assistance with cooking/housework;Assist for transportation ?  ?Equipment Recommendations ? Rolling walker (2 wheels)  ?  ?Recommendations for Other Services   ? ? ?  ?Precautions / Restrictions Precautions ?Precautions: Fall ?Restrictions ?Weight Bearing Restrictions: No  ?  ? ?Mobility ? Bed Mobility ?Overal bed mobility: Needs Assistance ?Bed Mobility: Supine to Sit ?  ?  ?Supine to sit: Mod assist, HOB elevated ?  ?  ?General bed mobility comments: Requiring modA to transfer to EOB primarily to scoot to EOB. Grossly supervision to reach LE's over EOB. ?Patient Response: Cooperative ? ?Transfers ?Overall transfer level: Needs assistance ?Equipment used: Rolling walker (2 wheels) ?Transfers: Bed to chair/wheelchair/BSC ?  ?  ?Step pivot transfers: Min assist ?  ?  ?  ?General transfer comment: step by step vcs and increased time to perform. IMproved speed of task with improved foot clearance but still having significant difficulty and imbalance and heavy reliance of UE's on RW to perform. ?  ? ?Ambulation/Gait ?  ?  ?  ?  ?  ?  ?  ?General Gait Details: deferred due to significant difficulty in step pivot transfer ? ? ?Stairs ?  ?  ?  ?  ?  ? ? ?Wheelchair Mobility ?  ? ?Modified Rankin (Stroke Patients Only) ?  ? ? ?  ?Balance Overall balance assessment: Needs assistance ?Sitting-balance support: Feet supported, Bilateral  upper extremity supported ?Sitting balance-Leahy Scale: Fair ?Sitting balance - Comments: posterior lean and L lateral lean ?Postural control: Posterior lean ?Standing balance support: Reliant on assistive device for balance, During  functional activity, Bilateral upper extremity supported ?Standing balance-Leahy Scale: Poor ?  ?  ?  ?  ?  ?  ?  ?  ?  ?  ?  ?  ?  ? ?  ?Cognition Arousal/Alertness: Awake/alert ?Behavior During Therapy: Flat affect ?Overall Cognitive Status: Impaired/Different from baseline ?  ?  ?  ?  ?  ?  ?  ?  ?  ?  ?  ?  ?Following Commands: Follows one step commands with increased time ?Safety/Judgement: Decreased awareness of safety, Decreased awareness of deficits ?  ?Problem Solving: Slow processing, Decreased initiation, Requires verbal cues, Requires tactile cues ?  ?  ?  ? ?  ?Exercises Other Exercises ?Other Exercises: Education on pelvis and LE placement to improve ease of standing transfers ? ?  ?General Comments   ?  ?  ? ?Pertinent Vitals/Pain Pain Assessment ?Pain Assessment: No/denies pain  ? ? ?Home Living   ?  ?  ?  ?  ?  ?  ?  ?  ?  ?   ?  ?Prior Function    ?  ?  ?   ? ?PT Goals (current goals can now be found in the care plan section) Acute Rehab PT Goals ?Patient Stated Goal: to get better ?PT Goal Formulation: With patient ?Time For Goal Achievement: 10/21/21 ?Potential to Achieve Goals: Fair ?Progress towards PT goals: Not progressing toward goals - comment (remains unsafe to trial gait due to difficulty in step pivot transfers. Onset of ear bleeding and increased HoH.) ? ?  ?Frequency ? ? ? 7X/week ? ? ? ?  ?PT Plan Current plan remains appropriate  ? ? ?Co-evaluation   ?  ?  ?  ?  ? ?  ?AM-PAC PT "6 Clicks" Mobility   ?Outcome Measure ? Help needed turning from your back to your side while in a flat bed without using bedrails?: A Little ?Help needed moving from lying on your back to sitting on the side of a flat bed without using bedrails?: A Little ?Help needed moving to and from a bed to a chair (including a wheelchair)?: A Lot ?Help needed standing up from a chair using your arms (e.g., wheelchair or bedside chair)?: A Lot ?Help needed to walk in hospital room?: A Lot ?Help needed climbing 3-5 steps  with a railing? : Total ?6 Click Score: 13 ? ?  ?End of Session Equipment Utilized During Treatment: Gait belt ?Activity Tolerance: Patient tolerated treatment well ?Patient left: with chair alarm set;in chair;with call bell/phone within reach ?Nurse Communication: Mobility status ?PT Visit Diagnosis: Unsteadiness on feet (R26.81);History of falling (Z91.81);Other abnormalities of gait and mobility (R26.89);Other symptoms and signs involving the nervous system (R29.898) ?  ? ? ?Time: 9622-2979 ?PT Time Calculation (min) (ACUTE ONLY): 20 min ? ?Charges:  $Therapeutic Activity: 8-22 mins          ?          ? ?Delphia Grates. Fairly IV, PT, DPT ?Physical Therapist- [place]  ?[facility]  ?10/13/2021, 1:33 PM ? ?

## 2021-10-13 NOTE — Plan of Care (Signed)

## 2021-10-13 NOTE — Progress Notes (Signed)
Occupational Therapy Treatment ?Patient Details ?Name: Gabriella Rogers ?MRN: [id number] ?DOB: 09/16/1933 ?Today's Date: 10/13/2021 ? ? ?History of present illness Pt is an 88 year old female admitted with small acute ischemic stroke right thalamus and right central semiovale. PMH significant for mild cognitive impairment from home, prior stroke and 2021 on aspirin currently, hyperlipidemia brought to ER with weakness and difficulty getting her words out ?  ?OT comments ? Pt. performed light grooming skills with complete set-up, and cues for task initiation. Pt. required complete set-up for self-feeding with cues to engage her bilateral hands with cues for opening packets, and utensil use during hand to mouth patterns. Pt. Requires cues for redirection, and cues for task initiation. Pt. Perseverated on changes in hearing, hearing loss, and bleeding from her left ear. Pt.'s nurse was notified, and MD is aware. Pt. Continues to benefit from OT services for ADL training, A/E training, neuromuscular re-education, and pt. education about home modification, and DME.   ? ?Recommendations for follow up therapy are one component of a multi-disciplinary discharge planning process, led by the attending physician.  Recommendations may be updated based on patient status, additional functional criteria and insurance authorization. ?   ?Follow Up Recommendations ? Skilled nursing-short term rehab (<3 hours/day)  ?  ?Assistance Recommended at Discharge Frequent or constant Supervision/Assistance  ?Patient can return home with the following ? Assistance with cooking/housework;Direct supervision/assist for financial management;Assist for transportation;Direct supervision/assist for medications management;A lot of help with walking and/or transfers;A lot of help with bathing/dressing/bathroom ?  ?Equipment Recommendations ?    ?  ?Recommendations for Other Services   ? ?  ?Precautions / Restrictions Precautions ?Precautions:  Fall ?Restrictions ?Weight Bearing Restrictions: No  ? ? ?  ? ?Mobility Bed Mobility ?  ?  ?  ? NT ?  ?  ?  ?  ?  ? ?Transfers ?  ?  ? NT ?  ?  ?  ?  ?  ?  ?  ?  ?  ?Balance   ?  ?  ?  ?  ?  ?  ?  ?  ?  ?  ?  ?  ?  ?  ?  ?  ?  ?  ?   ? ?ADL either performed or assessed with clinical judgement  ? ?ADL   ?Eating/Feeding: Independent;Set up ?Eating/Feeding Details (indicate cue type and reason): complete set-up required, and cues ?Grooming: Wash/dry hands;Wash/dry face;Sitting;Set up;Independent;Brushing hair ?  ?  ?  ?  ?  ?  ?  ?  ?  ?  ?  ?  ?  ?  ?  ?  ?  ?  ? ?Extremity/Trunk Assessment Upper Extremity Assessment ?Upper Extremity Assessment: Generalized weakness ?  ?  ?  ?  ?  ? ?Vision   ?  ?  ?Perception   ?  ?Praxis   ?  ? ?Cognition Arousal/Alertness: Awake/alert ?Behavior During Therapy: Flat affect ?Overall Cognitive Status: Impaired/Different from baseline ?Area of Impairment: Orientation, Attention, Memory, Following commands, Safety/judgement, Awareness ?  ?  ?  ?  ?  ?  ?  ?  ?Orientation Level: Disoriented to, Time ?  ?Memory: Decreased recall of precautions, Decreased short-term memory ?Following Commands: Follows one step commands with increased time ?Safety/Judgement: Decreased awareness of safety, Decreased awareness of deficits ?  ?Problem Solving: Slow processing, Decreased initiation, Requires verbal cues, Requires tactile cues ?General Comments: Newer onset of HOH, and bleeding in the left ear. ?  ?  ?   ?  Exercises   ? ?  ?Shoulder Instructions   ? ? ?  ?General Comments    ? ? ?Pertinent Vitals/ Pain       Pain Assessment ?Pain Assessment: No/denies pain ? ?Home Living   ?  ?  ?  ?  ?  ?  ?  ?  ?  ?  ?  ?  ?  ?  ?  ?  ?  ?  ? ?  ?Prior Functioning/Environment    ?  ?  ?  ?   ? ?Frequency ? Min 3X/week  ? ? ? ? ?  ?Progress Toward Goals ? ?OT Goals(current goals can now be found in the care plan section) ? Progress towards OT goals: Progressing toward goals ? ?Acute Rehab OT Goals ?OT Goal  Formulation: With patient ?Time For Goal Achievement: 10/30/21 ?Potential to Achieve Goals: Good  ?Plan Discharge plan needs to be updated   ? ?Co-evaluation ? ? ?   ?  ?  ?  ?  ? ?  ?AM-PAC OT "6 Clicks" Daily Activity     ?Outcome Measure ? ? Help from another person eating meals?: A Little ?Help from another person taking care of personal grooming?: A Little ?Help from another person toileting, which includes using toliet, bedpan, or urinal?: A Little ?Help from another person bathing (including washing, rinsing, drying)?: A Lot ?Help from another person to put on and taking off regular upper body clothing?: A Little ?Help from another person to put on and taking off regular lower body clothing?: A Lot ?6 Click Score: 16 ? ?  ?End of Session   ? ?OT Visit Diagnosis: Other symptoms and signs involving the nervous system (R29.898);Muscle weakness (generalized) (M62.81);Unsteadiness on feet (R26.81) ?  ?Activity Tolerance  (Perseverating on changes in hearing) ?  ?Patient Left in chair;with call bell/phone within reach;with chair alarm set ?  ?Nurse Communication Mobility status;Other (comment) ?  ? ?   ? ?Time: [id number] ?OT Time Calculation (min): 26 min ? ?Charges: OT General Charges ?$OT Visit: 1 Visit ?OT Treatments ?$Self Care/Home Management : 23-37 mins ? ?Harrel Carina, MS, OTR/L  ? ?Harrel Carina ?10/13/2021, 10:47 AM ?

## 2021-10-13 NOTE — TOC Progression Note (Signed)
Transition of Care (TOC) - Progression Note  ? ? ?Patient Details  ?Name: Gabriella Rogers ?MRN: [id number] ?Date of Birth: 1934/03/28 ? ?Transition of Care (TOC) CM/SW Contact  ?Caryn Section, RN ?Phone Number: ?10/13/2021, 1:45 PM ? ?Clinical Narrative:   Snf authorization still pending at this time. ? ? ? ?Expected Discharge Plan: Skilled Nursing Facility ?Barriers to Discharge: Continued Medical Work up ? ?Expected Discharge Plan and Services ?Expected Discharge Plan: Skilled Nursing Facility ?  ?Discharge Planning Services: CM Consult ?  ?Living arrangements for the past 2 months: Single Family Home ?Expected Discharge Date: 10/13/21               ?DME Arranged: N/A ?DME Agency: NA ?  ?  ?  ?  ?  ?  ?  ?  ? ? ?Social Determinants of Health (SDOH) Interventions ?  ? ?Readmission Risk Interventions ?   ? View : No data to display.  ?  ?  ?  ? ? ?

## 2021-10-14 DIAGNOSIS — D539 Nutritional anemia, unspecified: Secondary | ICD-10-CM

## 2021-10-14 DIAGNOSIS — I639 Cerebral infarction, unspecified: Secondary | ICD-10-CM | POA: Diagnosis not present

## 2021-10-14 DIAGNOSIS — G243 Spasmodic torticollis: Secondary | ICD-10-CM | POA: Diagnosis not present

## 2021-10-14 DIAGNOSIS — G3184 Mild cognitive impairment, so stated: Secondary | ICD-10-CM

## 2021-10-14 DIAGNOSIS — N179 Acute kidney failure, unspecified: Secondary | ICD-10-CM

## 2021-10-14 DIAGNOSIS — R531 Weakness: Secondary | ICD-10-CM | POA: Diagnosis not present

## 2021-10-14 DIAGNOSIS — E876 Hypokalemia: Secondary | ICD-10-CM

## 2021-10-14 LAB — BASIC METABOLIC PANEL
Anion gap: 5 (ref 5–15)
BUN: 16 mg/dL (ref 8–23)
CO2: 25 mmol/L (ref 22–32)
Calcium: 8.3 mg/dL — ABNORMAL LOW (ref 8.9–10.3)
Chloride: 103 mmol/L (ref 98–111)
Creatinine, Ser: 0.68 mg/dL (ref 0.44–1.00)
GFR, Estimated: >60 mL/min (ref >60)
Glucose, Bld: 88 mg/dL (ref 70–99)
Potassium: 3.2 mmol/L — ABNORMAL LOW (ref 3.5–5.1)
Sodium: 133 mmol/L — ABNORMAL LOW (ref 135–145)

## 2021-10-14 LAB — CBC
HCT: 30.4 % — ABNORMAL LOW (ref 36.0–46.0)
Hemoglobin: 10.3 g/dL — ABNORMAL LOW (ref 12.0–15.0)
MCH: 33.4 pg (ref 26.0–34.0)
MCHC: 33.9 g/dL (ref 30.0–36.0)
MCV: 98.7 fL (ref 80.0–100.0)
Platelets: 434 10*3/uL — ABNORMAL HIGH (ref 150–400)
RBC: 3.08 MIL/uL — ABNORMAL LOW (ref 3.87–5.11)
RDW: 12.5 % (ref 11.5–15.5)
WBC: 10 10*3/uL (ref 4.0–10.5)
nRBC: 0 % (ref 0.0–0.2)

## 2021-10-14 MED ORDER — POTASSIUM CHLORIDE CRYS ER 20 MEQ PO TBCR
40.0000 meq | EXTENDED_RELEASE_TABLET | Freq: Two times a day (BID) | ORAL | Status: AC
  Administered 2021-10-14 (×2): 40 meq via ORAL
  Filled 2021-10-14 (×2): qty 2

## 2021-10-14 NOTE — TOC Progression Note (Signed)
Transition of Care (TOC) - Progression Note  ? ? ?Patient Details  ?Name: WILENA TYNDALL ?MRN: [id number] ?Date of Birth: 1933/08/08 ? ?Transition of Care (TOC) CM/SW Contact  ?Caryn Section, RN ?Phone Number: ?10/14/2021, 2:31 PM ? ?Clinical Narrative:   Authorization obtained for SNF by [facility]. Patient not medically ready, will re evaluate tomorrow. ? ? ? ?Expected Discharge Plan: Skilled Nursing Facility ?Barriers to Discharge: Continued Medical Work up ? ?Expected Discharge Plan and Services ?Expected Discharge Plan: Skilled Nursing Facility ?  ?Discharge Planning Services: CM Consult ?  ?Living arrangements for the past 2 months: Single Family Home ?Expected Discharge Date: 10/13/21               ?DME Arranged: N/A ?DME Agency: NA ?  ?  ?  ?  ?  ?  ?  ?  ? ? ?Social Determinants of Health (SDOH) Interventions ?  ? ?Readmission Risk Interventions ?   ? View : No data to display.  ?  ?  ?  ? ? ?

## 2021-10-14 NOTE — Progress Notes (Signed)
Occupational Therapy Treatment ?Patient Details ?Name: Gabriella Rogers ?MRN: [id number] ?DOB: Dec 18, 1933 ?Today's Date: 10/14/2021 ? ? ?History of present illness Pt is an 88 year old female admitted with small acute ischemic stroke right thalamus and right central semiovale. PMH significant for mild cognitive impairment from home, prior stroke and 2021 on aspirin currently, hyperlipidemia brought to ER with weakness and difficulty getting her words out ?  ?OT comments ? Chart reviewed, pt greeted in room in chair, agreeable to OT Tx session. Pt is alert and oriented to self, place, date; not oriented to situation (stating "I lost my hearing"). Tx session targeted progressing functional endurance/strength in setting of ADL tasks. MOD A required for 3 STS with RW with step by step vcs required for technique and body mechanics. Grooming tasks completed in sitting with SET UP, LB dressing with MOD A, UB dressing with MIN A. UB/LB bathing completed with MIN A. Pt attempted to amb in room, tolerating approx 7 feet before requesting rest break due to c/o significant fatigue. Team updated re: pt status. Pt is left in bedside chair, NAD, all needs met. Positioning required due to L lateral lean.OT will continue to follow while admitted.   ? ?Recommendations for follow up therapy are one component of a multi-disciplinary discharge planning process, led by the attending physician.  Recommendations may be updated based on patient status, additional functional criteria and insurance authorization. ?   ?Follow Up Recommendations ? Skilled nursing-short term rehab (<3 hours/day)  ?  ?Assistance Recommended at Discharge Frequent or constant Supervision/Assistance  ?Patient can return home with the following ? Assistance with cooking/housework;Direct supervision/assist for financial management;Assist for transportation;Direct supervision/assist for medications management;A lot of help with walking and/or transfers;A lot of help with  bathing/dressing/bathroom ?  ?Equipment Recommendations ? None recommended by OT  ?  ?Recommendations for Other Services   ? ?  ?Precautions / Restrictions Precautions ?Precautions: Fall ?Restrictions ?Weight Bearing Restrictions: No  ? ? ?  ? ?Mobility Bed Mobility ?  ?  ?  ?  ?  ?  ?  ?General bed mobility comments: NT pt in chair at start/end of session ?  ? ?Transfers ?Overall transfer level: Needs assistance ?Equipment used: Rolling walker (2 wheels) ?Transfers: Sit to/from Stand, Bed to chair/wheelchair/BSC ?Sit to Stand: Mod assist ?  ?  ?  ?  ?  ?General transfer comment: STS 3x with MOD A ?  ?  ?Balance Overall balance assessment: Needs assistance ?Sitting-balance support: Feet supported, Bilateral upper extremity supported ?Sitting balance-Leahy Scale: Fair ?Sitting balance - Comments: L lateral lean ?  ?Standing balance support: Reliant on assistive device for balance, During functional activity, Bilateral upper extremity supported ?Standing balance-Leahy Scale: Poor ?  ?  ?  ?  ?  ?  ?  ?  ?  ?  ?  ?  ?   ? ?ADL either performed or assessed with clinical judgement  ? ?ADL Overall ADL's : Needs assistance/impaired ?Eating/Feeding: Set up;Sitting ?  ?Grooming: Dance movement psychotherapist;Set up;Sitting ?  ?Upper Body Bathing: Minimal assistance;Sitting ?  ?Lower Body Bathing: Minimal assistance;Sit to/from stand;Moderate assistance ?Lower Body Bathing Details (indicate cue type and reason): with RW ?Upper Body Dressing : Minimal assistance;Sitting ?  ?Lower Body Dressing: Moderate assistance;Sit to/from stand ?Lower Body Dressing Details (indicate cue type and reason): underwear ?Toilet Transfer: Moderate assistance;Stand-pivot;Rolling walker (2 wheels);Cueing for safety;Cueing for sequencing;Ambulation ?Toilet Transfer Details (indicate cue type and reason): simulated ?  ?  ?  ?  ?Functional mobility  during ADLs: Minimal assistance;Moderate assistance;Rolling walker (2 wheels);Cueing for sequencing;Cueing for safety  (approx 7 feet in room, unable to tolerate further mobility due to pt reported fatigue) ?  ?  ? ?Extremity/Trunk Assessment   ?  ?  ?  ?  ?  ? ?Vision   ?  ?  ?Perception   ?  ?Praxis   ?  ? ?Cognition Arousal/Alertness: Awake/alert ?Behavior During Therapy: Flat affect ?Overall Cognitive Status: Impaired/Different from baseline ?Area of Impairment: Orientation, Attention, Memory, Following commands, Safety/judgement, Awareness ?  ?  ?  ?  ?  ?  ?  ?  ?Orientation Level: Time, Situation, Disoriented to ?Current Attention Level: Sustained ?Memory: Decreased recall of precautions, Decreased short-term memory ?Following Commands: Follows one step commands with increased time ?Safety/Judgement: Decreased awareness of safety, Decreased awareness of deficits ?Awareness: Emergent ?Problem Solving: Slow processing, Decreased initiation, Requires verbal cues, Requires tactile cues ?  ?  ?  ?   ?Exercises   ? ?  ?Shoulder Instructions   ? ? ?  ?General Comments    ? ? ?Pertinent Vitals/ Pain       Pain Assessment ?Pain Assessment: Faces ?Faces Pain Scale: Hurts little more ?Pain Location: R ear ?Pain Descriptors / Indicators: Grimacing ?Pain Intervention(s): Limited activity within patient's tolerance, Monitored during session, Repositioned ? ?Home Living   ?  ?  ?  ?  ?  ?  ?  ?  ?  ?  ?  ?  ?  ?  ?  ?  ?  ?  ? ?  ?Prior Functioning/Environment    ?  ?  ?  ?   ? ?Frequency ? Min 3X/week  ? ? ? ? ?  ?Progress Toward Goals ? ?OT Goals(current goals can now be found in the care plan section) ? Progress towards OT goals: Progressing toward goals ? ?Acute Rehab OT Goals ?Patient Stated Goal: feel better ?OT Goal Formulation: With patient ?Time For Goal Achievement: 10/28/21 ?Potential to Achieve Goals: Good  ?Plan Discharge plan needs to be updated   ? ?Co-evaluation ? ? ?   ?  ?  ?  ?  ? ?  ?AM-PAC OT "6 Clicks" Daily Activity     ?Outcome Measure ? ? Help from another person eating meals?: A Little ?Help from another person  taking care of personal grooming?: A Little ?Help from another person toileting, which includes using toliet, bedpan, or urinal?: A Little ?Help from another person bathing (including washing, rinsing, drying)?: A Lot ?Help from another person to put on and taking off regular upper body clothing?: A Little ?Help from another person to put on and taking off regular lower body clothing?: A Lot ?6 Click Score: 16 ? ?  ?End of Session Equipment Utilized During Treatment: Rolling walker (2 wheels);Gait belt ? ?OT Visit Diagnosis: Other symptoms and signs involving the nervous system (R29.898);Muscle weakness (generalized) (M62.81);Unsteadiness on feet (R26.81) ?  ?Activity Tolerance Patient tolerated treatment well ?  ?Patient Left in chair;with call bell/phone within reach;with chair alarm set ?  ?Nurse Communication Mobility status ?  ? ?   ? ?Time: 5784-6962 ?OT Time Calculation (min): 31 min ? ?Charges: OT General Charges ?$OT Visit: 1 Visit ?OT Treatments ?$Self Care/Home Management : 8-22 mins ?$Therapeutic Activity: 8-22 mins ? ?Shanon Payor, OTD OTR/L  ?10/14/21, 3:31 PM  ?

## 2021-10-14 NOTE — Progress Notes (Signed)
Physical Therapy Treatment ?Patient Details ?Name: Gabriella Rogers ?MRN: [id number] ?DOB: Sep 19, 1933 ?Today's Date: 10/14/2021 ? ? ?History of Present Illness Pt is an 88 year old female admitted with small acute ischemic stroke right thalamus and right central semiovale. PMH significant for mild cognitive impairment from home, prior stroke and 2021 on aspirin currently, hyperlipidemia brought to ER with weakness and difficulty getting her words out ? ?  ?PT Comments  ? ? Pt was long sitting in bed upon arriving. She is HOH but also disoriented to situation. Pt was oriented to self and location however cognition plays a large factor in pt's inabilities to tolerate much. She was able to exit L side of bed with assistance. Stood to [employer] with assistance and ambulated ~ 10 ft with poor gait posture and safety. Pt have flexed posture and c/o severe R knee pain in all wt bearing. Attempted 2nd trial of gait with little success due to pain and cognition. She was able to sit in recliner at conclusion of session. Chair alarm in place. Highly recommend DC to SNF to address deficits while assisting pt to PLOF.  ?  ?Recommendations for follow up therapy are one component of a multi-disciplinary discharge planning process, led by the attending physician.  Recommendations may be updated based on patient status, additional functional criteria and insurance authorization. ? ?Follow Up Recommendations ? Skilled nursing-short term rehab (<3 hours/day) ?  ?  ?Assistance Recommended at Discharge Frequent or constant Supervision/Assistance  ?Patient can return home with the following Direct supervision/assist for financial management;Direct supervision/assist for medications management;A lot of help with bathing/dressing/bathroom;A little help with walking and/or transfers;Help with stairs or ramp for entrance;Assistance with cooking/housework;Assist for transportation ?  ?Equipment Recommendations ? Rolling walker (2 wheels)  ?  ?    ?Precautions / Restrictions Precautions ?Precautions: Fall ?Restrictions ?Weight Bearing Restrictions: No  ?  ? ?Mobility ? Bed Mobility ?Overal bed mobility: Needs Assistance ?Bed Mobility: Supine to Sit ?  ?  ?Supine to sit: Min assist, HOB elevated ? General bed mobility comments: increased time to perform ?  ? ?Transfers ?Overall transfer level: Needs assistance ?Equipment used: Rolling walker (2 wheels) ?Transfers: Sit to/from Stand ?Sit to Stand: Min assist ?   ?General transfer comment: pt stood 4 x throughout session. vcs for handplacement and improved technique ?  ? ?Ambulation/Gait ?Ambulation/Gait assistance: Min assist ?Gait Distance (Feet): 10 Feet ?Assistive device: Rolling walker (2 wheels) ?Gait Pattern/deviations: Decreased step length - right, Decreased step length - left, Decreased stride length, Decreased dorsiflexion - right, Decreased dorsiflexion - left, Trunk flexed ?Gait velocity: decreased ?  ?  ?General Gait Details: pt was able to ambulate ~ 10 ft with poor gait posture and c/o severe R knee pain. She was limited overall mostly by cognition. ? ? ?  ?Balance Overall balance assessment: Needs assistance ?Sitting-balance support: Feet supported, Bilateral upper extremity supported ?Sitting balance-Leahy Scale: Fair ?  ?  ?Standing balance support: Reliant on assistive device for balance, During functional activity, Bilateral upper extremity supported ?Standing balance-Leahy Scale: Poor ?  ?  ?  ?  ?Cognition Arousal/Alertness: Awake/alert ?Behavior During Therapy: Flat affect ?Overall Cognitive Status: Impaired/Different from baseline ?Area of Impairment: Orientation, Attention, Memory, Following commands, Safety/judgement, Awareness ?   ?  ?Orientation Level: Time, Situation ?  ?  ?General Comments: Pt is oriented to self and hospital however cognition deficits are apparent. Pt has flat effect and is HOH. Difficult time processing with decreased initiation of movements ?  ?  ? ?  ?   ?    ? ?  Pertinent Vitals/Pain Pain Assessment ?Pain Assessment: No/denies pain  ? ? ? ?PT Goals (current goals can now be found in the care plan section) Acute Rehab PT Goals ?Patient Stated Goal: none stated ?Progress towards PT goals: Not progressing toward goals - comment (cognition and lknee pain limiting) ? ?  ?Frequency ? ? ? 7X/week ? ? ? ?  ?PT Plan Current plan remains appropriate  ? ? ?   ?AM-PAC PT "6 Clicks" Mobility   ?Outcome Measure ? Help needed turning from your back to your side while in a flat bed without using bedrails?: A Little ?Help needed moving from lying on your back to sitting on the side of a flat bed without using bedrails?: A Little ?Help needed moving to and from a bed to a chair (including a wheelchair)?: A Lot ?Help needed standing up from a chair using your arms (e.g., wheelchair or bedside chair)?: A Lot ?Help needed to walk in hospital room?: A Lot ?Help needed climbing 3-5 steps with a railing? : A Lot ?6 Click Score: 14 ? ?  ?End of Session   ?Activity Tolerance: Patient limited by pain;Other (comment) (limited with) ?Patient left: with chair alarm set;in chair;with call bell/phone within reach ?Nurse Communication: Mobility status ?PT Visit Diagnosis: Unsteadiness on feet (R26.81);History of falling (Z91.81);Other abnormalities of gait and mobility (R26.89);Other symptoms and signs involving the nervous system (R29.898) ?  ? ? ?Time: 0350-0938 ?PT Time Calculation (min) (ACUTE ONLY): 25 min ? ?Charges:  $Gait Training: 8-22 mins ?$Therapeutic Activity: 8-22 mins          ?          ?Jetta Lout PTA ?10/14/21, 9:17 AM  ? ?

## 2021-10-14 NOTE — Progress Notes (Signed)
?PROGRESS NOTE ? ?Gabriella Rogers    DOB: 09/06/1933, 88 y.o.  ?[id number]  ?  Code Status: DNR   ?DOA: 10/06/2021   LOS: 7  ? ?Brief hospital course  ?Gabriella Rogers is a 88 y.o. female with a PMH significant for Mild cognitive impairment, depression cervical dystonia on Botox shots, prior stroke in August 2021 on DAPT for 3 months but currently on aspirin , HLD, hard of hearing. ?They presented from home to the ED on 10/06/2021 with weakness and speech abnormalities x 1 days.  She had no other pain or ill symptoms. ?In the ED, it was found that they had stable vital signs. ?Significant lab findings included WBC 12,000, hemoglobin 11.9, baseline 12-13, with MCV of 101.  Potassium 3, creatinine 1.21 up from baseline of 0.83, urinalysis unremarkable, UDS positive for benzos, COVID and flu negative.  ?Brain MRI showed acute ischemia in ventral right thalamus and right centrum semiovale.  Negative for hemorrhage or mass effect. ?Neurology was consulted. ?They were treated with chewable aspirin, IV potassium, maintenance fluids.  ?Patient was admitted to medicine service for further workup and management of acute stroke as outlined in detail below. ? ? ? ?10/14/21 -stable, improved ? ?Assessment & Plan  ?Principal Problem: ?  Acute CVA (cerebrovascular accident) ([place]) ?Active Problems: ?  AKI (acute kidney injury) ([place]) ?  Hypokalemia ?  Macrocytic anemia ?  Leukocytosis ?  Cervical dystonia ?  Depression ?  Mild cognitive impairment ?  Generalized weakness ?  Stroke (cerebrum) ([place]) ? ?Acute CVA- improving ?-Neurology signed off ? -Aspirin and Plavix indefinitely ? -Continue statin ?-PT/OT recommending SNF ?-TOC following ?  ?Right knee pain- denies any knee pain today ?- Uric acid normal.  X-ray shows advanced osteoarthritis.  No evidence of effusion.  Can place knee brace. ?Stays swollen per daughter, its at baseline.. ?  ?B/L Ear discharge- positive cerumen drainage on left.  Patient is hard of hearing.  Denies ear  pain. ?-ENT following - recommended Ciprodex x 7 days and outptn follow up  ?  ?Hypokalemia- K+ 3.2 today ?- Replete PRN ?-BMP a.m. ?  ?AKI- Resolved ?  ?Leukocytosis-resolved ?  ?Macrocytic anemia- stable ?Follow anemia profile ?  ?Generalized weakness- Secondary to underlying stroke.   ?- PT/OT recommended SNF ?  ?Mild cognitive impairment ?- Continue donepezil ?  ?Cervical dystonia ?Receives Botox by [facility] ?Continue clonazepam as need ? ?Body mass index is 21.28 kg/m?. ? ?VTE ppx: Place and maintain sequential compression device Start: 10/13/21 1120 ? ? ?Diet:  ?   ?Diet  ? Diet regular Room service appropriate? Yes with Assist; Fluid consistency: Thin  ? ?Subjective 10/14/21   ? ?Pt reports no complaints today. Denies any pain.  ?  ?Objective  ? ?Vitals:  ? 10/13/21 1955 10/14/21 0126 10/14/21 0551 10/14/21 0747  ?BP: 139/63 (!) 107/53 129/63 (!) 148/71  ?Pulse: 81 88 77 76  ?Resp: 16 18 16 16   ?Temp: 99.2 ?F (37.3 ?C) 99 ?F (37.2 ?C) 98.8 ?F (37.1 ?C) 99.4 ?F (37.4 ?C)  ?TempSrc: Oral Oral Oral   ?SpO2: 96% 95% 95% 94%  ?Weight:      ? ? ?Intake/Output Summary (Last 24 hours) at 10/14/2021 1111 ?Last data filed at 10/13/2021 2030 ?Gross per 24 hour  ?Intake 340 ml  ?Output --  ?Net 340 ml  ? ?Filed Weights  ? 10/06/21 2335  ?Weight: 54.5 kg  ?  ? ?Physical Exam:  ?General: awake, alert, NAD ?HEENT: atraumatic, clear conjunctiva,  anicteric sclera, MMM, hard of hearing ?Cerumen draining on left auricle.  External right ear normal ?Respiratory: normal respiratory effort. ?Cardiovascular: quick capillary refill, normal S1/S2, RRR, no JVD, murmurs ?Gastrointestinal: soft, NT, ND ?Nervous: A&O x3. no gross focal neurologic deficits, normal speech ?Extremities: moves all equally, no edema, normal tone ?Skin: dry, intact, normal temperature, normal color. No rashes, lesions or ulcers on exposed skin ?Psychiatry: normal mood, congruent affect ? ?Labs   ?I have personally reviewed the following labs and imaging  studies ?CBC ?   ?Component Value Date/Time  ? WBC 10.0 10/14/2021 0801  ? RBC 3.08 (L) 10/14/2021 0801  ? HGB 10.3 (L) 10/14/2021 0801  ? HGB 12.1 02/25/2014 1741  ? HCT 30.4 (L) 10/14/2021 0801  ? HCT 36.9 02/25/2014 1741  ? PLT 434 (H) 10/14/2021 0801  ? PLT 344 02/25/2014 1741  ? MCV 98.7 10/14/2021 0801  ? MCV 104 (H) 02/25/2014 1741  ? MCH 33.4 10/14/2021 0801  ? MCHC 33.9 10/14/2021 0801  ? RDW 12.5 10/14/2021 0801  ? RDW 13.3 02/25/2014 1741  ? LYMPHSABS 0.8 10/07/2021 1805  ? LYMPHSABS 1.4 11/22/2011 0455  ? MONOABS 0.4 10/07/2021 1805  ? MONOABS 0.4 11/22/2011 0455  ? EOSABS 0.5 10/07/2021 1805  ? EOSABS 0.1 11/22/2011 0455  ? BASOSABS 0.1 10/07/2021 1805  ? BASOSABS 0.0 11/22/2011 0455  ? ? ?  Latest Ref Rng & Units 10/14/2021  ?  8:01 AM 10/08/2021  ?  5:01 AM 10/07/2021  ? 12:06 AM  ?BMP  ?Glucose 70 - 99 mg/dL 88   94   120    ?BUN 8 - 23 mg/dL 16   11   19     ?Creatinine 0.44 - 1.00 mg/dL 0.68   0.68   1.21    ?Sodium 135 - 145 mmol/L 133   138   137    ?Potassium 3.5 - 5.1 mmol/L 3.2   3.9   3.0    ?Chloride 98 - 111 mmol/L 103   110   102    ?CO2 22 - 32 mmol/L 25   24   26     ?Calcium 8.9 - 10.3 mg/dL 8.3   8.3   8.7    ? ? ?DG Knee 1-2 Views Right ? ?Result Date: 10/12/2021 ?CLINICAL DATA:  Knee pain EXAM: RIGHT KNEE - 1-2 VIEW COMPARISON:  None. FINDINGS: Bones are osteopenic. Advanced right knee tricompartmental osteoarthritis with joint space narrowing, sclerosis, osteophyte formation, and chondrocalcinosis. Medial compartment is most severe. Query small joint effusion on the lateral view. Healed deformity of the right proximal tibia shaft from prior trauma. No focal soft tissue abnormality. Peripheral atherosclerosis noted. No acute osseous finding. IMPRESSION: Osteopenia and advanced right knee tricompartmental osteoarthritis. No acute finding by plain radiography. Electronically Signed   By: Jerilynn Mages.  Shick M.D.   On: 10/12/2021 11:21   ? ?Disposition Plan & Communication  ?Patient status: Inpatient   ?Admitted From: Home ?Planned disposition location: Skilled nursing facility ?Anticipated discharge date: 4/27 pending lab monitoring and SNF availability  ? ?Family Communication: none  ?  ?Author: ?Richarda Osmond, DO ?[facility] ?10/14/2021, 11:11 AM  ? ?Available by Epic secure chat 7AM-7PM. ?If 7PM-7AM, please contact night-coverage.  ?[facility] contact information found on [email or web address] ? ?

## 2021-10-15 DIAGNOSIS — G243 Spasmodic torticollis: Secondary | ICD-10-CM | POA: Diagnosis not present

## 2021-10-15 DIAGNOSIS — H9213 Otorrhea, bilateral: Secondary | ICD-10-CM | POA: Diagnosis not present

## 2021-10-15 DIAGNOSIS — N179 Acute kidney failure, unspecified: Secondary | ICD-10-CM | POA: Diagnosis not present

## 2021-10-15 DIAGNOSIS — I639 Cerebral infarction, unspecified: Secondary | ICD-10-CM | POA: Diagnosis not present

## 2021-10-15 DIAGNOSIS — H903 Sensorineural hearing loss, bilateral: Secondary | ICD-10-CM

## 2021-10-15 LAB — BASIC METABOLIC PANEL
Anion gap: 5 (ref 5–15)
BUN: 14 mg/dL (ref 8–23)
CO2: 26 mmol/L (ref 22–32)
Calcium: 8.9 mg/dL (ref 8.9–10.3)
Chloride: 104 mmol/L (ref 98–111)
Creatinine, Ser: 0.56 mg/dL (ref 0.44–1.00)
GFR, Estimated: >60 mL/min (ref >60)
Glucose, Bld: 98 mg/dL (ref 70–99)
Potassium: 4.9 mmol/L (ref 3.5–5.1)
Sodium: 135 mmol/L (ref 135–145)

## 2021-10-15 MED ORDER — CLONAZEPAM 0.5 MG PO TABS: 0.5000 mg | ORAL_TABLET | Freq: Every day | ORAL | 0 refills | Status: DC | PRN

## 2021-10-15 NOTE — Progress Notes (Signed)
Called report to Konrad Dolores RN at [facility] [phone] ?

## 2021-10-15 NOTE — Discharge Summary (Signed)
? ? ?Physician Discharge Summary  ?Patient: Gabriella Rogers RUE:[id number] DOB: 06/22/1933   Code Status: DNR ?Admit date: 10/06/2021 ?Discharge date: 10/15/2021 ?Disposition: Skilled nursing facility, No home health services recommended ?PCP: Corky DownsMasoud, Javed, MD ? ?Recommendations for Outpatient Follow-up:  ?Follow up with PCP within 1-2 weeks ?Recommend monitoring electrolytes ?Follow up with neurology in 1-2 weeks for stroke follow up ?Follow up with ENT for ear drainage. Evaluated by Dr. Jenne CampusMcQueen inpatient and prescribed otic ciprodex to be completed 4/29 ? ?Discharge Diagnoses:  ?Principal Problem: ?  Acute CVA (cerebrovascular accident) ([facility]) ?Active Problems: ?  AKI (acute kidney injury) ([facility]) ?  Hypokalemia ?  Macrocytic anemia ?  Leukocytosis ?  Cervical dystonia ?  Depression ?  Mild cognitive impairment ?  Generalized weakness ?  Stroke (cerebrum) ([facility]) ? ?Brief Hospital Course Summary: ?Gabriella HopperSylvia C Rogers is a 88 y.o. female with a PMH significant for Mild cognitive impairment, depression, macrocytic anemia, cervical dystonia on Botox shots, prior stroke in August 2021 on DAPT for 3 months but currently on aspirin alone, HLD, hard of hearing. ?They presented from home to the ED on 10/06/2021 with weakness and speech abnormalities x 1 days.  She had no other pain or ill symptoms.  ?Brain MRI showed acute ischemia in ventral right thalamus and right centrum semiovale.  Negative for hemorrhage or mass effect. ?Neurology was consulted. ?She was outside the time window for intervention. ? ?In the ED, it was found that they had stable vital signs. ?Significant lab findings included WBC 12,000, hemoglobin 11.9, baseline 12-13, with MCV of 101.  Potassium 3, creatinine 1.21 up from baseline of 0.83, urinalysis unremarkable, UDS positive for benzos, COVID and flu negative.  ?They were treated with chewable aspirin, IV potassium, maintenance fluids. ? ?Throughout her stay, she remained stable with decreased ability from her  baseline. PT/OT/SLP evaluated and recommended discharge to SNF for further rehabilitation. Of note, she does have a baseline dementia prior to recent stroke which appears to have worsened. She is alert and oriented and able to transfer and feed herself. She is very hard of hearing which can give impression that she is more confused than she is in reality.  ? ?Additionally, during her stay, she began having excessive cerumen drainage from her ears, left worse than right. She was started on 7 days cipropdex drops per recommendation of ENT, Dr. Jenne CampusMcQueen, and will follow up outpatient.  ? ?She did complain of knee pain during stay but imaging was unremarkable and daughter endorsed this is a chronic problem for her. ? ?Discharge Condition: Good, improved ?Recommended discharge diet: Regular healthy diet ? ?Consultations: ?Neurology ?ENT ? ?Procedures/Studies: ?None  ? ? ?Allergies as of 10/15/2021   ? ?   Reactions  ? Actonel  [risedronate Sodium]   ? [place]   ? Pneumococcal Vaccine Other (See Comments)  ? WEAKNESS, DISORIENTED, "ARM SORE, RED, HURTING"  ? Risedronate Other (See Comments)  ? REDNESS IN FACE  ? Alendronate Rash  ? Aleve [naproxen Sodium] Rash  ? Pravastatin Rash  ? ?  ? ?  ?Medication List  ?  ? ?STOP taking these medications   ? ?aspirin 325 MG tablet ?Replaced by: aspirin 81 MG EC tablet ?  ?clonazePAM 0.5 MG tablet ?Commonly known as: KLONOPIN ?  ? ?  ? ?TAKE these medications   ? ?acetaminophen 325 MG tablet ?Commonly known as: TYLENOL ?Take 650 mg by mouth every 6 (six) hours as needed. ?  ?amLODipine 5 MG tablet ?Commonly known as:  NORVASC ?Take 1 tablet (5 mg total) by mouth daily. ?  ?aspirin 81 MG EC tablet ?Take 1 tablet (81 mg total) by mouth daily. Swallow whole. ?Replaces: aspirin 325 MG tablet ?  ?atorvastatin 80 MG tablet ?Commonly known as: LIPITOR ?Take 1 tablet (80 mg total) by mouth daily at 6 PM. ?  ?ciprofloxacin-dexamethasone OTIC suspension ?Commonly known as: CIPRODEX ?Place 4 drops  into both ears 2 (two) times daily for 4 days. ?  ?clopidogrel 75 MG tablet ?Commonly known as: PLAVIX ?Take 1 tablet (75 mg total) by mouth daily. ?  ?donepezil 10 MG tablet ?Commonly known as: ARICEPT ?Take 1 tablet (10 mg total) by mouth at bedtime. ?  ?famotidine 20 MG tablet ?Commonly known as: PEPCID ?Take 20 mg by mouth 2 (two) times daily. ?  ?ONE-A-DAY WOMENS PO ?Take 1 tablet by mouth daily. ?  ?PARoxetine 10 MG tablet ?Commonly known as: PAXIL ?Take 10 mg by mouth daily. ?  ?polyethylene glycol 17 g packet ?Commonly known as: MIRALAX / GLYCOLAX ?Take 17 g by mouth daily as needed for moderate constipation or severe constipation. ?  ? ?  ? ? Follow-up Information   ? ? Linus Salmons, MD. Schedule an appointment as soon as possible for a visit in 1 day(s).   ?Specialty: Otolaryngology ?Why: 1-2 days post hospital discharge. ?Contact information: ?[address] ?[address] ?[place] [address] ?[phone] ? ? ?  ?  ? ?  ?  ? ?  ? ? ?Subjective   ?Pt reports she is doing well today. She has no specific complaints but isn't sure that she's ready to go to SNF yet.  ?Objective  ?Blood pressure 140/73, pulse 84, temperature 97.7 ?F (36.5 ?C), temperature source Oral, resp. rate 14, weight 54.5 kg, SpO2 95 %.  ? ?General: Pt is alert, awake, not in acute distress eating breakfast ?Cardiovascular: RRR, S1/S2 +, no rubs, no gallops ?Respiratory: CTA bilaterally, no wheezing, no rhonchi ?Abdominal: Soft, NT, ND, bowel sounds + ?Extremities: no edema, no cyanosis ?Neuro: alert and oriented to self and place. Generalized weakness worse in lower extremities.  ? ? ?The results of significant diagnostics from this hospitalization (including imaging, microbiology, ancillary and laboratory) are listed below for reference.  ? ?Imaging studies: ?CT ANGIO HEAD NECK W WO CM ? ?Result Date: 10/08/2021 ?CLINICAL DATA:  Stroke follow-up EXAM: CT ANGIOGRAPHY HEAD AND NECK TECHNIQUE: Multidetector CT imaging of the  head and neck was performed using the standard protocol during bolus administration of intravenous contrast. Multiplanar CT image reconstructions and MIPs were obtained to evaluate the vascular anatomy. Carotid stenosis measurements (when applicable) are obtained utilizing NASCET criteria, using the distal internal carotid diameter as the denominator. RADIATION DOSE REDUCTION: This exam was performed according to the departmental dose-optimization program which includes automated exposure control, adjustment of the mA and/or kV according to patient size and/or use of iterative reconstruction technique. CONTRAST:  29mL OMNIPAQUE IOHEXOL 350 MG/ML SOLN COMPARISON:  Brain MRI from yesterday FINDINGS: CT HEAD FINDINGS Brain: No visible progression or hemorrhage at the right thalamic infarct. Chronic small vessel ischemia in the hemispheric white matter. Chronic lacunar infarcts in the brainstem. Vascular: See below Skull: Negative Sinuses: No acute finding Orbits: Remote left orbital floor fracture with healed depression. Review of the MIP images confirms the above findings CTA NECK FINDINGS Aortic arch: Atheromatous plaque with 3 vessel branching. No acute finding. Right carotid system: Calcified plaque at the bifurcation without flow limiting stenosis or ulceration. Left carotid system:  Calcified plaque at the bifurcation without significant stenosis or ulceration. Vertebral arteries: Proximal subclavian atherosclerosis more notable on the right where there is at least 70% stenosis as measured on coronal reformats. Left vertebral artery dominance. Both vertebral arteries are smoothly contoured and patent to the dura. Skeleton: Advanced cervical spine degeneration which is generalized. Other neck: No acute or aggressive finding Upper chest: Biapical scar-like opacity greater on the right where there is also calcification. Review of the MIP images confirms the above findings CTA HEAD FINDINGS Anterior circulation:  Atheromatous plaque at the bilateral cavernous carotid. No branch occlusion or beading. Mild to moderate atheromatous narrowing at the right MCA bifurcation. Negative for aneurysm Posterior circulation: Fet

## 2021-10-15 NOTE — Progress Notes (Signed)
Physical Therapy Treatment ?Patient Details ?Name: Gabriella Rogers ?MRN: [id number] ?DOB: [birth year] ?Today's Date: 10/15/2021 ? ? ?History of Present Illness Pt is an 88 year old female admitted with small acute ischemic stroke right thalamus and right central semiovale. PMH significant for mild cognitive impairment from home, prior stroke and 2021 on aspirin currently, hyperlipidemia brought to ER with weakness and difficulty getting her words out ? ?  ?PT Comments  ? ? Pt was long sitting in bed upon arriving. She continues to present with altered mental status with flat affect. She is HOH also. More noticeable R neglect today but was able to look R and visually scan/follow finger with vcs. Pt is disoriented to situation and time. She knew she was in hospital however unaware why. Pt inconsistently follows commands however is cooperative throughout. Pt does continue to require assistance with bed mobility, transfers, and gait. Poor motor planning at times. Struggles with LLE advancement in standing task. Pt will require extensive PT going forward to maximize independence. SNF is most appropriate at DC to address these deficits trying to assist pt to PLOF.Acute PT will continue to follow and progress as able per current POC.  ?   ?Recommendations for follow up therapy are one component of a multi-disciplinary discharge planning process, led by the attending physician.  Recommendations may be updated based on patient status, additional functional criteria and insurance authorization. ? ?Follow Up Recommendations ? Skilled nursing-short term rehab (<3 hours/day) ?  ?  ?Assistance Recommended at Discharge Frequent or constant Supervision/Assistance  ?Patient can return home with the following Direct supervision/assist for financial management;Direct supervision/assist for medications management;A lot of help with bathing/dressing/bathroom;Help with stairs or ramp for entrance;Assistance with cooking/housework;Assist for  transportation;A lot of help with walking and/or transfers ?  ?Equipment Recommendations ? Rolling walker (2 wheels)  ?  ?   ?Precautions / Restrictions Precautions ?Precautions: Fall ?Restrictions ?Weight Bearing Restrictions: No  ?  ? ?Mobility ? Bed Mobility ?Overal bed mobility: Needs Assistance ?Bed Mobility: Supine to Sit ?  ?  ?Supine to sit: Min assist, HOB elevated ?  ?  ?General bed mobility comments: min assist to exit L side of bed. pt with more noticable R neglect today but was able to turn head towrds R with vcs and tracking task. ?  ? ?Transfers ?Overall transfer level: Needs assistance ?Equipment used: Rolling walker (2 wheels) ?Transfers: Sit to/from Stand ?Sit to Stand: Min assist, Mod assist ?  ?  ?  ?  ?  ?General transfer comment: decreased initiation of movements however pt required slightly less assistance to stand. poor motor planning and sequencing. struggles with LLE advancement ?  ? ?Ambulation/Gait ?Ambulation/Gait assistance: Min assist ?Gait Distance (Feet): 8 Feet ?Assistive device: Rolling walker (2 wheels) ?Gait Pattern/deviations: Decreased step length - right, Decreased step length - left, Decreased stride length, Decreased dorsiflexion - right, Decreased dorsiflexion - left, Trunk flexed ?Gait velocity: decreased ?  ?  ?General Gait Details: pt was able to ambulate ~ 8 ft with RW with slow, poor motor sequencing. assisted with lateral wt shift to allow LLE advancement ? ? ? ?  ?Balance Overall balance assessment: Needs assistance ?Sitting-balance support: Feet supported, Bilateral upper extremity supported ?Sitting balance-Leahy Scale: Fair ?  ?  ?Standing balance support: Reliant on assistive device for balance, During functional activity, Bilateral upper extremity supported ?Standing balance-Leahy Scale: Poor ?Standing balance comment: high fall risk ?  ?  ?  ?Cognition Arousal/Alertness: Awake/alert ?Behavior During Therapy: Flat affect ?Overall  Cognitive Status:  Impaired/Different from baseline ?Area of Impairment: Orientation, Attention, Memory, Following commands, Safety/judgement, Awareness ?  ?   ?Orientation Level: Disoriented to, Time, Situation ?Current Attention Level: Selective ?Memory: Decreased recall of precautions, Decreased short-term memory ?Following Commands: Follows multi-step commands inconsistently, Follows one step commands inconsistently ?Safety/Judgement: Decreased awareness of safety, Decreased awareness of deficits ?Awareness: Intellectual ?Problem Solving: Slow processing, Decreased initiation, Difficulty sequencing, Requires verbal cues, Requires tactile cues ?General Comments: Pt is alert but continues to have altered mental status. she is HOH and due to poor overall insight of current situation, present confused at time. Pleasantly confused. unable to follow multistep commands. ?  ?  ? ?  ?   ?   ? ?Pertinent Vitals/Pain Pain Assessment ?Pain Assessment: No/denies pain  ? ? ? ?PT Goals (current goals can now be found in the care plan section) Acute Rehab PT Goals ?Patient Stated Goal: none stated ?Progress towards PT goals: Not progressing toward goals - comment (cognition slowing progress) ? ?  ?Frequency ? ? ? 7X/week ? ? ? ?  ?PT Plan Current plan remains appropriate  ? ? ?   ?AM-PAC PT "6 Clicks" Mobility   ?Outcome Measure ? Help needed turning from your back to your side while in a flat bed without using bedrails?: A Little ?Help needed moving from lying on your back to sitting on the side of a flat bed without using bedrails?: A Little ?Help needed moving to and from a bed to a chair (including a wheelchair)?: A Lot ?Help needed standing up from a chair using your arms (e.g., wheelchair or bedside chair)?: A Lot ?Help needed to walk in hospital room?: A Lot ?Help needed climbing 3-5 steps with a railing? : A Lot ?6 Click Score: 14 ? ?  ?End of Session   ?Activity Tolerance: Patient tolerated treatment well;Other (comment) (cognition  slowing progression) ?Patient left: with chair alarm set;in chair;with call bell/phone within reach ?Nurse Communication: Mobility status ?PT Visit Diagnosis: Unsteadiness on feet (R26.81);History of falling (Z91.81);Other abnormalities of gait and mobility (R26.89);Other symptoms and signs involving the nervous system (R29.898) ?  ? ? ?Time: [phone] ?PT Time Calculation (min) (ACUTE ONLY): 14 min ? ?Charges:  $Neuromuscular Re-education: 8-22 mins          ?          ? ?Jetta Lout PTA ?10/15/21, 8:58 AM  ? ?

## 2021-10-15 NOTE — Plan of Care (Signed)
?  Problem: Education: ?Goal: Knowledge of General Education information will improve ?Description: Including pain rating scale, medication(s)/side effects and non-pharmacologic comfort measures ?Outcome: Progressing ?  ?Problem: Health Behavior/Discharge Planning: ?Goal: Ability to manage health-related needs will improve ?Outcome: Progressing ?  ?Problem: Clinical Measurements: ?Goal: Ability to maintain clinical measurements within normal limits will improve ?Outcome: Progressing ?Goal: Will remain free from infection ?Outcome: Progressing ?Goal: Diagnostic test results will improve ?Outcome: Progressing ?Goal: Respiratory complications will improve ?Outcome: Progressing ?Goal: Cardiovascular complication will be avoided ?Outcome: Progressing ?  ?Problem: Activity: ?Goal: Risk for activity intolerance will decrease ?Outcome: Progressing ?  ?Problem: Nutrition: ?Goal: Adequate nutrition will be maintained ?Outcome: Progressing ?  ?Problem: Coping: ?Goal: Level of anxiety will decrease ?Outcome: Progressing ?  ?Problem: Elimination: ?Goal: Will not experience complications related to bowel motility ?Outcome: Progressing ?Goal: Will not experience complications related to urinary retention ?Outcome: Progressing ?  ?Problem: Pain Managment: ?Goal: General experience of comfort will improve ?Outcome: Progressing ?  ?Problem: Safety: ?Goal: Ability to remain free from injury will improve ?Outcome: Progressing ?  ?Problem: Skin Integrity: ?Goal: Risk for impaired skin integrity will decrease ?Outcome: Progressing ?  ?Problem: Education: ?Goal: Knowledge of disease or condition will improve ?Outcome: Progressing ?Goal: Knowledge of secondary prevention will improve (SELECT ALL) ?Outcome: Progressing ?Goal: Knowledge of patient specific risk factors will improve (INDIVIDUALIZE FOR PATIENT) ?Outcome: Progressing ?Goal: Individualized Educational Video(s) ?Outcome: Progressing ?  ?Problem: Coping: ?Goal: Will verbalize  positive feelings about self ?Outcome: Progressing ?  ?Problem: Health Behavior/Discharge Planning: ?Goal: Ability to manage health-related needs will improve ?Outcome: Progressing ?  ?Problem: Self-Care: ?Goal: Ability to participate in self-care as condition permits will improve ?Outcome: Progressing ?  ?

## 2021-10-15 NOTE — TOC Progression Note (Signed)
Transition of Care (TOC) - Progression Note  ? ? ?Patient Details  ?Name: Gabriella Rogers ?MRN: [id number] ?Date of Birth: 25-Sep-1933 ? ?Transition of Care (TOC) CM/SW Contact  ?[name], RN ?Phone Number: ?10/15/2021, 11:59 AM ? ?Clinical Narrative:    ? ?Patient going to room 810 at [facility], EMS called to transport ?Daughter called and made aware ?Bedside nurse to call report ? ?Expected Discharge Plan: Skilled Nursing Facility ?Barriers to Discharge: Continued Medical Work up ? ?Expected Discharge Plan and Services ?Expected Discharge Plan: Skilled Nursing Facility ?  ?Discharge Planning Services: CM Consult ?  ?Living arrangements for the past 2 months: Single Family Home ?Expected Discharge Date: 10/15/21               ?DME Arranged: N/A ?DME Agency: NA ?  ?  ?  ?  ?  ?  ?  ?  ? ? ?Social Determinants of Health (SDOH) Interventions ?  ? ?Readmission Risk Interventions ?   ? View : No data to display.  ?  ?  ?  ? ? ?

## 2021-10-15 NOTE — Care Management Important Message (Signed)
Important Message ? ?Patient Details  ?Name: Gabriella Rogers ?MRN: [id number] ?Date of Birth: 1933-07-21 ? ? ?Medicare Important Message Given:  Yes ? ? ? ? ?Juliann Pulse A Luna Audia ?10/15/2021, 10:53 AM ?

## 2022-01-09 ENCOUNTER — Emergency Department: Payer: Medicare HMO

## 2022-01-09 ENCOUNTER — Other Ambulatory Visit: Payer: Self-pay

## 2022-01-09 ENCOUNTER — Emergency Department
Admission: EM | Admit: 2022-01-09 | Discharge: 2022-01-09 | Disposition: A | Discharge status: Skilled Nursing Facility | Payer: Medicare HMO | Attending: Emergency Medicine | Admitting: Emergency Medicine

## 2022-01-09 ENCOUNTER — Encounter: Payer: Self-pay | Admitting: Intensive Care

## 2022-01-09 DIAGNOSIS — S41112A Laceration without foreign body of left upper arm, initial encounter: Secondary | ICD-10-CM | POA: Insufficient documentation

## 2022-01-09 DIAGNOSIS — S300XXA Contusion of lower back and pelvis, initial encounter: Secondary | ICD-10-CM | POA: Diagnosis not present

## 2022-01-09 DIAGNOSIS — M542 Cervicalgia: Secondary | ICD-10-CM | POA: Diagnosis not present

## 2022-01-09 DIAGNOSIS — G8929 Other chronic pain: Secondary | ICD-10-CM | POA: Diagnosis not present

## 2022-01-09 DIAGNOSIS — W19XXXA Unspecified fall, initial encounter: Secondary | ICD-10-CM | POA: Insufficient documentation

## 2022-01-09 DIAGNOSIS — F0390 Unspecified dementia without behavioral disturbance: Secondary | ICD-10-CM | POA: Insufficient documentation

## 2022-01-09 DIAGNOSIS — S4992XA Unspecified injury of left shoulder and upper arm, initial encounter: Secondary | ICD-10-CM | POA: Diagnosis present

## 2022-01-09 LAB — COMPREHENSIVE METABOLIC PANEL
ALT: 28 U/L (ref 0–44)
AST: 37 U/L (ref 15–41)
Albumin: 3.5 g/dL (ref 3.5–5.0)
Alkaline Phosphatase: 103 U/L (ref 38–126)
Anion gap: 7 (ref 5–15)
BUN: 21 mg/dL (ref 8–23)
CO2: 26 mmol/L (ref 22–32)
Calcium: 9 mg/dL (ref 8.9–10.3)
Chloride: 105 mmol/L (ref 98–111)
Creatinine, Ser: 0.88 mg/dL (ref 0.44–1.00)
GFR, Estimated: >60 mL/min (ref >60)
Glucose, Bld: 102 mg/dL — ABNORMAL HIGH (ref 70–99)
Potassium: 3.5 mmol/L (ref 3.5–5.1)
Sodium: 138 mmol/L (ref 135–145)
Total Bilirubin: 0.4 mg/dL (ref 0.3–1.2)
Total Protein: 7.4 g/dL (ref 6.5–8.1)

## 2022-01-09 LAB — CBC WITH DIFFERENTIAL/PLATELET
Abs Immature Granulocytes: 0.02 10*3/uL (ref 0.00–0.07)
Basophils Absolute: 0.1 10*3/uL (ref 0.0–0.1)
Basophils Relative: 1 %
Eosinophils Absolute: 0.2 10*3/uL (ref 0.0–0.5)
Eosinophils Relative: 2 %
HCT: 33.1 % — ABNORMAL LOW (ref 36.0–46.0)
Hemoglobin: 10.7 g/dL — ABNORMAL LOW (ref 12.0–15.0)
Immature Granulocytes: 0 %
Lymphocytes Relative: 17 %
Lymphs Abs: 1.5 10*3/uL (ref 0.7–4.0)
MCH: 34.4 pg — ABNORMAL HIGH (ref 26.0–34.0)
MCHC: 32.3 g/dL (ref 30.0–36.0)
MCV: 106.4 fL — ABNORMAL HIGH (ref 80.0–100.0)
Monocytes Absolute: 0.6 10*3/uL (ref 0.1–1.0)
Monocytes Relative: 7 %
Neutro Abs: 6.1 10*3/uL (ref 1.7–7.7)
Neutrophils Relative %: 73 %
Platelets: 427 10*3/uL — ABNORMAL HIGH (ref 150–400)
RBC: 3.11 MIL/uL — ABNORMAL LOW (ref 3.87–5.11)
RDW: 15.3 % (ref 11.5–15.5)
WBC: 8.4 10*3/uL (ref 4.0–10.5)
nRBC: 0 % (ref 0.0–0.2)

## 2022-01-09 NOTE — ED Triage Notes (Signed)
First Nurse Note:  Pt via EMS from [facility]. Pt has a mechanical fall today, staff reports head injury. Pt c/o R forearm pain. Skin tear and bruising noted to R forearm. Pt is on Plavix. No obvious injuries to her head. Pt has a hx of dementia. Pt is alert.   HR 70  O2 100%  BP 103/46

## 2022-01-09 NOTE — ED Provider Notes (Addendum)
[facility] Provider Note    Event Date/Time   First MD Initiated Contact with Patient 01/09/22 1714     (approximate)   History   Fall   HPI  Gabriella Rogers is a 88 y.o. female with a history of dementia, CVA, anemia, cervical dystonia, and depression who presents for evaluation after a fall today.  The patient states she remembers the fall and believes she slipped.  She does not believe she hit her head.  She is complaining of pain in her "backside" meaning her lower back and buttock area.  She also has a skin tear to the left arm.  She reported some neck pain in triage but denies any acute neck pain at this time.  She does have some chronic neck pain.  The patient has no other acute complaints.     Physical Exam   Triage Vital Signs: ED Triage Vitals  Enc Vitals Group     BP 01/09/22 1506 (!) 118/56     Pulse Rate 01/09/22 1506 67     Resp 01/09/22 1506 18     Temp 01/09/22 1506 98.6 F (37 C)     Temp Source 01/09/22 1506 Oral     SpO2 01/09/22 1506 98 %     Weight 01/09/22 1522 109 lb (49.4 kg)     Height 01/09/22 1522 5\' 3"  (1.6 m)     Head Circumference --      Peak Flow --      Pain Score 01/09/22 1521 5     Pain Loc --      Pain Edu? --      Excl. in [place]? --     Most recent vital signs: Vitals:   01/09/22 2002 01/09/22 2105  BP:  (!) 156/60  Pulse:  66  Resp:  17  Temp: (!) 97.4 F (36.3 C)   SpO2:  100%     General: Alert, oriented x2, comfortable appearing. CV:  Good peripheral perfusion.  Resp:  Normal effort.  Abd:  No distention.  Other:  EOMI.  PERRLA.  No facial droop.  Motor intact in all extremities.  No ataxia.  No midline cervical or thoracic spinal tenderness.  Mild lumbar midline tenderness with no step-off or crepitus.  Full range of motion to bilateral hips, mild right knee pain which patient states is chronic.  2 cm superficial skin tear left medial forearm.  Full range of motion at the left shoulder, elbow,  and wrist.   ED Results / Procedures / Treatments   Labs (all labs ordered are listed, but only abnormal results are displayed) Labs Reviewed  CBC WITH DIFFERENTIAL/PLATELET - Abnormal; Notable for the following components:      Result Value   RBC 3.11 (*)    Hemoglobin 10.7 (*)    HCT 33.1 (*)    MCV 106.4 (*)    MCH 34.4 (*)    Platelets 427 (*)    All other components within normal limits  COMPREHENSIVE METABOLIC PANEL - Abnormal; Notable for the following components:   Glucose, Bld 102 (*)    All other components within normal limits     EKG  ED ECG REPORT I, 01/11/22, the attending physician, personally viewed and interpreted this ECG.  Date: 01/09/2022 EKG Time: 1513 Rate: 65 Rhythm: normal sinus rhythm QRS Axis: normal Intervals: normal ST/T Wave abnormalities: normal Narrative Interpretation: no evidence of acute ischemia    RADIOLOGY  CT head: I independently  viewed and interpreted the images; there is no ICH or other acute traumatic findings.  Radiology report indicates no acute abnormalities.  CT lumbar spine: IMPRESSION:  Chronic T12 vertebral compression fracture with unchanged 50-60%  height loss. 4 mm bony retropulsion at the level of the T12 superior  endplate contributing to mild relative spinal canal narrowing.    No evidence of acute lumbar spine fracture.    Lumbar and lower thoracic spondylosis, as outlined. No more than  mild central canal narrowing. Subarticular narrowing bilaterally at  L4-L5 and on the right at L5-S1. Sites of mild neural foraminal  narrowing, as detailed.    Lumbar levocurvature centered at the L2-L3 level.    Mild grade 1 spondylolisthesis at L1-L2, L2-L3, L4-L5 and L5-S1.    Partially imaged cystic appearing structure within the right lower  quadrant of the abdomen/pelvis, measuring at least 3.4 cm. This is  indeterminate in etiology and clinical significance. A non-emergent  contrast-enhanced CT  abdomen/pelvis is recommended for further  evaluation.    Partially imaged pulmonary opacity within the right lower lobe. A  chest radiograph is recommended for further evaluation.    Aortic Atherosclerosis (ICD10-I70.0).    XR pelvis: No acute fracture  PROCEDURES:  Critical Care performed: No  Procedures   MEDICATIONS ORDERED IN ED: Medications - No data to display   IMPRESSION / MDM / [place] / ED COURSE  I reviewed the triage vital signs and the nursing notes.  88 year old female with PMH as noted above presents for evaluation after a fall earlier today mainly complaining of pain to her lower back and buttock area.  Neurologic exam is normal.  The patient has mild tenderness to the lumbosacral area and a small skin tear to the left upper arm with no other acute exam findings.  Differential diagnosis includes, but is not limited to, contusion, muscle strain/spasm, lumbar compression fracture, pelvic fracture.  CT head shows no acute findings.  EKG is unremarkable.  Basic labs obtained from triage show normal electrolytes, hemoglobin at baseline for patient, and no acute abnormalities.  Patient's presentation is most consistent with acute presentation with potential threat to life or bodily function.  I have added on x-rays of the pelvis and a CT of the lumbar spine.  There is no indication for CT of the rest of the spine.  ----------------------------------------- 8:05 PM on 01/09/2022 -----------------------------------------  X-ray of the pelvis and CT of the lumbar spine did not show any acute traumatic findings.  The lumbar CT did show a cyst in the right lower quadrant as well as a possible right lower lobe opacity.  Chest x-ray was obtained which did not show any acute findings in this region.  The patient is stable for discharge back to her facility..  I spoke to her daughter over the phone and informed her about the findings and the plan of care.   Return precautions provided as well as specific discharge instructions related to the abdominal cyst and required follow-up.   FINAL CLINICAL IMPRESSION(S) / ED DIAGNOSES   Final diagnoses:  Fall, initial encounter  Contusion of buttock, initial encounter  Skin tear of left upper arm without complication, initial encounter     Rx / DC Orders   ED Discharge Orders     None        Note:  This document was prepared using Dragon voice recognition software and may include unintentional dictation errors.    Arta Silence, MD 01/09/22 [phone]  Dionne Bucy, MD 01/09/22 2351

## 2022-01-09 NOTE — ED Triage Notes (Signed)
Patient arrived by EMS from [facility] for mechanical fall. Staff reports head injury. Patient c/o right wrist pain and neck pain but reports she has neck pain before fall. Skin tear to left upper arm. Patient takes plavix. Patient has history dementia. A&O x4.

## 2022-01-09 NOTE — Discharge Instructions (Addendum)
Gabriella Rogers had CTs of her head and lumbar spine as well as x-rays of her chest and her pelvis.  There are no signs of any fracture or other severe injury from her fall today.  She can resume her normal activity and continue taking her normal medications.  She should return to the ER for new, worsening, or persistent severe pain, recurrent falls, changes in mental status, or any other new or worsening symptoms that are concerning.  Of note, the CT scan of the spine showed a cyst in the right lower part of the abdomen.  This will need follow-up with a CT scan with contrast as an outpatient.  This can be arranged through her primary care doctor.

## 2022-04-30 ENCOUNTER — Emergency Department: Payer: Medicare HMO

## 2022-04-30 ENCOUNTER — Inpatient Hospital Stay
Admission: EM | Admit: 2022-04-30 | Discharge: 2022-05-04 | DRG: 177 | Disposition: A | Discharge status: Home Health | Payer: Medicare HMO | Source: Skilled Nursing Facility | Attending: Internal Medicine | Admitting: Internal Medicine

## 2022-04-30 ENCOUNTER — Other Ambulatory Visit: Payer: Self-pay

## 2022-04-30 DIAGNOSIS — X58XXXA Exposure to other specified factors, initial encounter: Secondary | ICD-10-CM | POA: Diagnosis present

## 2022-04-30 DIAGNOSIS — Z66 Do not resuscitate: Secondary | ICD-10-CM | POA: Diagnosis present

## 2022-04-30 DIAGNOSIS — F0390 Unspecified dementia without behavioral disturbance: Secondary | ICD-10-CM | POA: Diagnosis present

## 2022-04-30 DIAGNOSIS — R197 Diarrhea, unspecified: Secondary | ICD-10-CM | POA: Diagnosis present

## 2022-04-30 DIAGNOSIS — J181 Lobar pneumonia, unspecified organism: Secondary | ICD-10-CM | POA: Diagnosis not present

## 2022-04-30 DIAGNOSIS — E876 Hypokalemia: Secondary | ICD-10-CM | POA: Diagnosis present

## 2022-04-30 DIAGNOSIS — E7800 Pure hypercholesterolemia, unspecified: Secondary | ICD-10-CM | POA: Diagnosis present

## 2022-04-30 DIAGNOSIS — M810 Age-related osteoporosis without current pathological fracture: Secondary | ICD-10-CM | POA: Diagnosis present

## 2022-04-30 DIAGNOSIS — J1282 Pneumonia due to coronavirus disease 2019: Secondary | ICD-10-CM | POA: Diagnosis present

## 2022-04-30 DIAGNOSIS — Z96659 Presence of unspecified artificial knee joint: Secondary | ICD-10-CM | POA: Diagnosis present

## 2022-04-30 DIAGNOSIS — J9601 Acute respiratory failure with hypoxia: Secondary | ICD-10-CM | POA: Diagnosis present

## 2022-04-30 DIAGNOSIS — J189 Pneumonia, unspecified organism: Secondary | ICD-10-CM

## 2022-04-30 DIAGNOSIS — U071 COVID-19: Principal | ICD-10-CM | POA: Diagnosis present

## 2022-04-30 DIAGNOSIS — Z90710 Acquired absence of both cervix and uterus: Secondary | ICD-10-CM | POA: Diagnosis not present

## 2022-04-30 DIAGNOSIS — J159 Unspecified bacterial pneumonia: Secondary | ICD-10-CM | POA: Diagnosis present

## 2022-04-30 DIAGNOSIS — F32A Depression, unspecified: Secondary | ICD-10-CM | POA: Diagnosis present

## 2022-04-30 DIAGNOSIS — N179 Acute kidney failure, unspecified: Secondary | ICD-10-CM | POA: Diagnosis present

## 2022-04-30 DIAGNOSIS — T502X5A Adverse effect of carbonic-anhydrase inhibitors, benzothiadiazides and other diuretics, initial encounter: Secondary | ICD-10-CM | POA: Diagnosis present

## 2022-04-30 DIAGNOSIS — Z8673 Personal history of transient ischemic attack (TIA), and cerebral infarction without residual deficits: Secondary | ICD-10-CM | POA: Diagnosis not present

## 2022-04-30 DIAGNOSIS — R531 Weakness: Secondary | ICD-10-CM

## 2022-04-30 DIAGNOSIS — F0393 Unspecified dementia, unspecified severity, with mood disturbance: Secondary | ICD-10-CM | POA: Diagnosis present

## 2022-04-30 DIAGNOSIS — I639 Cerebral infarction, unspecified: Secondary | ICD-10-CM | POA: Diagnosis present

## 2022-04-30 DIAGNOSIS — I10 Essential (primary) hypertension: Secondary | ICD-10-CM | POA: Insufficient documentation

## 2022-04-30 DIAGNOSIS — R296 Repeated falls: Secondary | ICD-10-CM | POA: Diagnosis present

## 2022-04-30 DIAGNOSIS — R001 Bradycardia, unspecified: Secondary | ICD-10-CM | POA: Diagnosis present

## 2022-04-30 DIAGNOSIS — Z9181 History of falling: Secondary | ICD-10-CM

## 2022-04-30 DIAGNOSIS — Z823 Family history of stroke: Secondary | ICD-10-CM | POA: Diagnosis not present

## 2022-04-30 LAB — COMPREHENSIVE METABOLIC PANEL
ALT: 41 U/L (ref 0–44)
AST: 64 U/L — ABNORMAL HIGH (ref 15–41)
Albumin: 3.4 g/dL — ABNORMAL LOW (ref 3.5–5.0)
Alkaline Phosphatase: 82 U/L (ref 38–126)
Anion gap: 9 (ref 5–15)
BUN: 31 mg/dL — ABNORMAL HIGH (ref 8–23)
CO2: 23 mmol/L (ref 22–32)
Calcium: 7.9 mg/dL — ABNORMAL LOW (ref 8.9–10.3)
Chloride: 103 mmol/L (ref 98–111)
Creatinine, Ser: 1.52 mg/dL — ABNORMAL HIGH (ref 0.44–1.00)
GFR, Estimated: 33 mL/min — ABNORMAL LOW (ref >60)
Glucose, Bld: 121 mg/dL — ABNORMAL HIGH (ref 70–99)
Potassium: 3.1 mmol/L — ABNORMAL LOW (ref 3.5–5.1)
Sodium: 135 mmol/L (ref 135–145)
Total Bilirubin: 0.6 mg/dL (ref 0.3–1.2)
Total Protein: 7.1 g/dL (ref 6.5–8.1)

## 2022-04-30 LAB — RESP PANEL BY RT-PCR (FLU A&B, COVID) ARPGX2
Influenza A by PCR: NEGATIVE
Influenza B by PCR: NEGATIVE
SARS Coronavirus 2 by RT PCR: POSITIVE — AB

## 2022-04-30 LAB — CBC
HCT: 32.3 % — ABNORMAL LOW (ref 36.0–46.0)
Hemoglobin: 11.1 g/dL — ABNORMAL LOW (ref 12.0–15.0)
MCH: 34.5 pg — ABNORMAL HIGH (ref 26.0–34.0)
MCHC: 34.4 g/dL (ref 30.0–36.0)
MCV: 100.3 fL — ABNORMAL HIGH (ref 80.0–100.0)
Platelets: 201 10*3/uL (ref 150–400)
RBC: 3.22 MIL/uL — ABNORMAL LOW (ref 3.87–5.11)
RDW: 14 % (ref 11.5–15.5)
WBC: 8 10*3/uL (ref 4.0–10.5)
nRBC: 0 % (ref 0.0–0.2)

## 2022-04-30 LAB — MAGNESIUM: Magnesium: 2 mg/dL (ref 1.7–2.4)

## 2022-04-30 LAB — TROPONIN I (HIGH SENSITIVITY)
Troponin I (High Sensitivity): 18 ng/L — ABNORMAL HIGH (ref <18)
Troponin I (High Sensitivity): 15 ng/L (ref <18)

## 2022-04-30 LAB — PROCALCITONIN: Procalcitonin: 1.79 ng/mL

## 2022-04-30 MED ORDER — SODIUM CHLORIDE 0.9 % IV SOLN
100.0000 mg | Freq: Two times a day (BID) | INTRAVENOUS | Status: DC
  Administered 2022-05-01 – 2022-05-02 (×2): 100 mg via INTRAVENOUS
  Filled 2022-04-30 (×4): qty 100

## 2022-04-30 MED ORDER — VITAMIN C 500 MG PO TABS
500.0000 mg | ORAL_TABLET | Freq: Every day | ORAL | Status: DC
  Administered 2022-04-30 – 2022-05-04 (×5): 500 mg via ORAL
  Filled 2022-04-30 (×5): qty 1

## 2022-04-30 MED ORDER — POLYETHYLENE GLYCOL 3350 17 G PO PACK: 17.0000 g | PACK | Freq: Every day | ORAL | Status: DC | PRN

## 2022-04-30 MED ORDER — SODIUM CHLORIDE 0.9 % IV SOLN
1.0000 g | INTRAVENOUS | Status: DC
  Administered 2022-05-01 – 2022-05-03 (×3): 1 g via INTRAVENOUS
  Filled 2022-04-30: qty 10
  Filled 2022-04-30 (×3): qty 1

## 2022-04-30 MED ORDER — ONDANSETRON HCL 4 MG/2ML IJ SOLN: 4.0000 mg | Freq: Four times a day (QID) | INTRAMUSCULAR | Status: DC | PRN

## 2022-04-30 MED ORDER — SODIUM CHLORIDE 0.9 % IV SOLN
100.0000 mg | Freq: Two times a day (BID) | INTRAVENOUS | Status: DC
  Filled 2022-04-30: qty 100

## 2022-04-30 MED ORDER — ASPIRIN 81 MG PO TBEC
81.0000 mg | DELAYED_RELEASE_TABLET | Freq: Every day | ORAL | Status: DC
  Administered 2022-05-01 – 2022-05-04 (×4): 81 mg via ORAL
  Filled 2022-04-30 (×4): qty 1

## 2022-04-30 MED ORDER — POTASSIUM CHLORIDE CRYS ER 20 MEQ PO TBCR
40.0000 meq | EXTENDED_RELEASE_TABLET | Freq: Once | ORAL | Status: DC
  Filled 2022-04-30: qty 2

## 2022-04-30 MED ORDER — ZINC SULFATE 220 (50 ZN) MG PO CAPS
220.0000 mg | ORAL_CAPSULE | Freq: Every day | ORAL | Status: DC
  Administered 2022-04-30 – 2022-05-04 (×5): 220 mg via ORAL
  Filled 2022-04-30 (×5): qty 1

## 2022-04-30 MED ORDER — SODIUM CHLORIDE 0.9 % IV SOLN: INTRAVENOUS | Status: DC

## 2022-04-30 MED ORDER — ACETAMINOPHEN 325 MG PO TABS: 650.0000 mg | ORAL_TABLET | Freq: Four times a day (QID) | ORAL | Status: DC | PRN

## 2022-04-30 MED ORDER — FAMOTIDINE 20 MG PO TABS: 20.0000 mg | ORAL_TABLET | Freq: Two times a day (BID) | ORAL | Status: DC

## 2022-04-30 MED ORDER — SODIUM CHLORIDE 0.9 % IV SOLN: 500.0000 mg | INTRAVENOUS | Status: DC

## 2022-04-30 MED ORDER — PAROXETINE HCL 10 MG PO TABS
10.0000 mg | ORAL_TABLET | Freq: Every day | ORAL | Status: DC
  Administered 2022-05-01 – 2022-05-04 (×4): 10 mg via ORAL
  Filled 2022-04-30 (×4): qty 1

## 2022-04-30 MED ORDER — CLOPIDOGREL BISULFATE 75 MG PO TABS
75.0000 mg | ORAL_TABLET | Freq: Every day | ORAL | Status: DC
  Administered 2022-05-01 – 2022-05-04 (×4): 75 mg via ORAL
  Filled 2022-04-30 (×4): qty 1

## 2022-04-30 MED ORDER — ONDANSETRON HCL 4 MG PO TABS: 4.0000 mg | ORAL_TABLET | Freq: Four times a day (QID) | ORAL | Status: DC | PRN

## 2022-04-30 MED ORDER — DEXAMETHASONE 4 MG PO TABS
6.0000 mg | ORAL_TABLET | Freq: Every day | ORAL | Status: DC
  Administered 2022-05-01 – 2022-05-04 (×4): 6 mg via ORAL
  Filled 2022-04-30 (×4): qty 2

## 2022-04-30 MED ORDER — SODIUM CHLORIDE 0.9 % IV SOLN
2.0000 g | Freq: Once | INTRAVENOUS | Status: AC
  Administered 2022-04-30: 2 g via INTRAVENOUS
  Filled 2022-04-30: qty 12.5

## 2022-04-30 MED ORDER — ATORVASTATIN CALCIUM 20 MG PO TABS
80.0000 mg | ORAL_TABLET | Freq: Every day | ORAL | Status: DC
  Administered 2022-04-30 – 2022-05-03 (×4): 80 mg via ORAL
  Filled 2022-04-30 (×4): qty 4

## 2022-04-30 MED ORDER — POTASSIUM CHLORIDE IN NACL 40-0.9 MEQ/L-% IV SOLN
INTRAVENOUS | Status: AC
  Filled 2022-04-30 (×2): qty 1000

## 2022-04-30 MED ORDER — VANCOMYCIN HCL IN DEXTROSE 1-5 GM/200ML-% IV SOLN
1000.0000 mg | Freq: Once | INTRAVENOUS | Status: AC
  Administered 2022-04-30: 1000 mg via INTRAVENOUS
  Filled 2022-04-30 (×2): qty 200

## 2022-04-30 MED ORDER — DONEPEZIL HCL 5 MG PO TABS
10.0000 mg | ORAL_TABLET | Freq: Every day | ORAL | Status: DC
  Administered 2022-04-30 – 2022-05-01 (×2): 10 mg via ORAL
  Filled 2022-04-30 (×2): qty 2

## 2022-04-30 MED ORDER — SODIUM CHLORIDE 0.9 % IV SOLN
200.0000 mg | Freq: Once | INTRAVENOUS | Status: DC
  Filled 2022-04-30: qty 40

## 2022-04-30 MED ORDER — DEXAMETHASONE SODIUM PHOSPHATE 10 MG/ML IJ SOLN
10.0000 mg | Freq: Once | INTRAMUSCULAR | Status: AC
  Administered 2022-04-30: 10 mg via INTRAVENOUS
  Filled 2022-04-30: qty 1

## 2022-04-30 MED ORDER — SODIUM CHLORIDE 0.9 % IV SOLN: 100.0000 mg | Freq: Every day | INTRAVENOUS | Status: DC

## 2022-04-30 MED ORDER — FAMOTIDINE 20 MG PO TABS
10.0000 mg | ORAL_TABLET | Freq: Every day | ORAL | Status: DC
  Administered 2022-05-01 – 2022-05-04 (×4): 10 mg via ORAL
  Filled 2022-04-30 (×4): qty 1

## 2022-04-30 MED ORDER — ENOXAPARIN SODIUM 30 MG/0.3ML IJ SOSY
30.0000 mg | PREFILLED_SYRINGE | INTRAMUSCULAR | Status: DC
  Administered 2022-04-30: 30 mg via SUBCUTANEOUS
  Filled 2022-04-30: qty 0.3

## 2022-04-30 MED ORDER — GUAIFENESIN-DM 100-10 MG/5ML PO SYRP: 10.0000 mL | ORAL_SOLUTION | ORAL | Status: DC | PRN

## 2022-04-30 MED ORDER — ADULT MULTIVITAMIN W/MINERALS CH
ORAL_TABLET | Freq: Every day | ORAL | Status: DC
  Administered 2022-05-01 – 2022-05-03 (×3): 1 via ORAL
  Filled 2022-04-30 (×3): qty 1

## 2022-04-30 NOTE — Assessment & Plan Note (Signed)
Continue donepezil 

## 2022-04-30 NOTE — ED Triage Notes (Signed)
Pt to ED via EMS from [facility] CC of shortness of breath r/t COVID. Pt was put on 2 L [place] and given 500 NS by EMS. Pt is A&O x1-2 at baseline. No family at bedside. Pt is alert and able to speak in full sentences. VS stable.

## 2022-04-30 NOTE — Assessment & Plan Note (Signed)
Continue Paxil.

## 2022-04-30 NOTE — ED Provider Notes (Signed)
[facility] Provider Note    Event Date/Time   First MD Initiated Contact with Patient 04/30/22 1139     (approximate)  History   Chief Complaint: Shortness of Breath  HPI  Gabriella Rogers is a 88 y.o. female with a past medical history of dementia, hypertension, hyperlipidemia, depression, presents emergency department for shortness of breath.  According to EMS they were called out to [place] facility to the memory care unit where the patient resides with a complaint of worsening shortness of breath.  They tested the patient for COVID this morning and she tested positive.  EMS states 88% room air saturation with no baseline O2 requirement.  They brought the patient to the emergency department on 2 L nasal cannula currently satting in the upper 90s on 2 L.  Here the patient is awake alert she has no complaints.  Does not recall testing positive for COVID, did not know what COVID was.  Physical Exam   Triage Vital Signs: ED Triage Vitals  Enc Vitals Group     BP      Pulse      Resp      Temp      Temp src      SpO2      Weight      Height      Head Circumference      Peak Flow      Pain Score      Pain Loc      Pain Edu?      Excl. in [place]?     Most recent vital signs: There were no vitals filed for this visit.  General: Awake, no distress.  Calm cooperative and pleasant. CV:  Good peripheral perfusion.  Regular rate and rhythm  Resp:  Normal effort.  Equal breath sounds bilaterally.  Abd:  No distention.  Soft, nontender.  No rebound or guarding.   ED Results / Procedures / Treatments   EKG  EKG viewed and interpreted by myself shows a normal sinus rhythm at 64 bpm with a narrow QRS, normal axis, normal intervals, no concerning ST changes.  RADIOLOGY  I have reviewed and interpreted the chest x-ray images.  Patient appears to have a left lower lobe pneumonia/consolidation on my evaluation. Radiology confirms patchy airspace  opacity in the left lung base.   MEDICATIONS ORDERED IN ED: Medications - No data to display   IMPRESSION / MDM / [place] / ED COURSE  I reviewed the triage vital signs and the nursing notes.  Patient's presentation is most consistent with acute presentation with potential threat to life or bodily function.  Patient presents emergency department for shortness of breath, tested positive for COVID this morning.  We will send confirmatory testing.  We will check labs and a chest x-ray.  Patient currently satting in the upper 90s on 2 L nasal cannula.  We will continue to closely monitor while awaiting lab and chest x-ray results.  Given the patient's new oxygen requirement hypoxia presumably due to COVID-19 infection anticipate likely admission to the hospital service once the patient's work-up is been completed.  Patient's labs have resulted showing an elevated procalcitonin possibly indicative of a bacterial infection.  Given her left lower lobe pneumonia we will cover with antibiotics to cover for hospital-acquired pneumonia as well as Decadron given the patient's COVID-positive status.  Reassuringly patient CBC is normal with a normal white blood cell count, chemistry is largely reassuring  with normal values.  Troponin of 18.  Given the patient's new O2 requirement hypoxia due to acute respiratory failure secondary to COVID-19 infection we will admit to the hospital service for ongoing work-up and treatment.  CRITICAL CARE Performed by: Minna Antis   Total critical care time: 30 minutes  Critical care time was exclusive of separately billable procedures and treating other patients.  Critical care was necessary to treat or prevent imminent or life-threatening deterioration.  Critical care was time spent personally by me on the following activities: development of treatment plan with patient and/or surrogate as well as nursing, discussions with consultants, evaluation of  patient's response to treatment, examination of patient, obtaining history from patient or surrogate, ordering and performing treatments and interventions, ordering and review of laboratory studies, ordering and review of radiographic studies, pulse oximetry and re-evaluation of patient's condition.   FINAL CLINICAL IMPRESSION(S) / ED DIAGNOSES   COVID-19 Hypoxia    Note:  This document was prepared using Dragon voice recognition software and may include unintentional dictation errors.   Minna Antis, MD 04/30/22 1343

## 2022-04-30 NOTE — Assessment & Plan Note (Addendum)
Patient presents for evaluation of shortness of breath and weakness and noted to have a left lower lobe pneumonia on chest x-ray. Procalcitonin level is elevated suggestive of possible superimposed bacterial infection We will treat patient empirically with Rocephin and doxycycline

## 2022-04-30 NOTE — Assessment & Plan Note (Signed)
Patient presents to the emergency room for evaluation of weakness and shortness of breath and had COVID-19 PCR is positive. She was noted to be hypoxic with room air pulse oximetry of 88% Patient is unvaccinated against the COVID-19 virus We will treat patient empirically with remdesivir Continue oxygen supplementation to maintain pulse oximetry greater than 92% Supportive care with vitamins, antitussives and bronchodilator therapy

## 2022-04-30 NOTE — Progress Notes (Signed)
Patient with severe dementia,unable to answer questions

## 2022-04-30 NOTE — Assessment & Plan Note (Signed)
Patient has a history of CVA Continue DAPT and atorvastatin

## 2022-04-30 NOTE — Progress Notes (Signed)
PHARMACY -  BRIEF ANTIBIOTIC NOTE   Pharmacy has received consult(s) for vancomycin and cefepime from an ED provider.  The patient's profile has been reviewed for ht/wt/allergies/indication/available labs.    One time order(s) placed for vancomycin 1,000 mg x 1, cefepime 2 grams x 1  Further antibiotics/pharmacy consults should be ordered by admitting physician if indicated.                       Thank you, Jaynie Bream 04/30/2022  1:48 PM

## 2022-04-30 NOTE — Progress Notes (Signed)
CODE SEPSIS - PHARMACY COMMUNICATION  **Broad Spectrum Antibiotics should be administered within 1 hour of Sepsis diagnosis**  Time Code Sepsis Called/Page Received: 1341  Antibiotics Ordered: vancomycin 1,000 mg x 1 and cefepime 2 grams x 1  Time of 1st antibiotic administration: 1408  Additional action taken by pharmacy: n/a  If necessary, Name of Provider/Nurse Contacted: n/a    Jaynie Bream ,PharmD Clinical Pharmacist  04/30/2022  1:50 PM

## 2022-04-30 NOTE — H&P (Addendum)
History and Physical    Patient: Gabriella Rogers WLN:[id number] DOB: 11/09/1933 DOA: 04/30/2022 DOS: the patient was seen and examined on 04/30/2022 PCP: Corky Downs, MD  Patient coming from: SNF. [facility]  Chief Complaint:  Chief Complaint  Patient presents with   Shortness of Breath   Most of the history was obtained from patient's daughter over the phone.  Patient is unable to provide any history. HPI: Gabriella Rogers is a 88 y.o. female with medical history significant for dementia, depression, history of prior stroke, dyslipidemia who was brought into the ER from the skilled nursing facility where she resides for evaluation of weakness and shortness of breath.  The nursing home staff tested the patient for COVID and she tested positive. Upon arrival to the ER she had room air pulse oximetry of 88% and was placed on 2 L of oxygen with improvement in her pulse oximetry to the upper 90s. I am unable to do review of systems on this patient due to her underlying dementia. Most of the history was obtained from her daughter over the phone who states that she was called by the nursing home because her mother complained of weakness and shortness of breath.  She noted that she was unusually quiet the day prior to this hospitalization.  She also notes that her mother is unvaccinated against the COVID-19 virus. Chest x-ray reviewed by me shows patchy airspace disease at the left lung base compatible with pneumonia. Labs show procalcitonin of 1.79, potassium of 3.1, creatinine of 1.52 compared to baseline of 0.88. She received a dose of IV cefepime, vancomycin and Decadron. She will be admitted to the hospital for further evaluation.   Review of Systems: unable to review all systems due to the inability of the patient to answer questions. Past Medical History:  Diagnosis Date   Brain disorder 03/27/2014   Cervical dystonia 01/24/2012   Clinical depression 11/26/2015   Dizziness 11/26/2015    Overview:  Chronic, felt secondary to inner ear per ENT evaluation approximately 1996.    H/O total knee replacement 11/23/2015   Headache, migraine 11/26/2015   HLD (hyperlipidemia) 11/26/2015   Hypercholesteremia    Osteoporosis, post-menopausal 11/26/2015   Past Surgical History:  Procedure Laterality Date   ABDOMINAL HYSTERECTOMY     ANKLE SURGERY Left    KNEE SURGERY Right    Social History:  reports that she has never smoked. She has never used smokeless tobacco. She reports that she does not drink alcohol and does not use drugs.  Allergies  Allergen Reactions   Actonel  [Risedronate Sodium]    Piper    Pneumococcal Vaccine Other (See Comments)    WEAKNESS, DISORIENTED, "ARM SORE, RED, HURTING"   Risedronate Other (See Comments)    REDNESS IN FACE   Alendronate Rash   Aleve [Naproxen Sodium] Rash   Pravastatin Rash    Family History  Problem Relation Age of Onset   Stroke Maternal Grandfather    Stroke Paternal Grandfather    Kidney cancer Neg Hx    Hematuria Neg Hx     Prior to Admission medications   Medication Sig Start Date End Date Taking? Authorizing Provider  aspirin EC 81 MG EC tablet Take 1 tablet (81 mg total) by mouth daily. Swallow whole. 10/14/21  Yes Amin, Loura Halt, MD  atorvastatin (LIPITOR) 80 MG tablet Take 1 tablet (80 mg total) by mouth daily at 6 PM. 08/25/20  Yes Masoud, Renda Rolls, MD  clopidogrel (PLAVIX) 75 MG tablet  Take 1 tablet (75 mg total) by mouth daily. 09/29/21  Yes Masoud, Renda Rolls, MD  donepezil (ARICEPT) 10 MG tablet Take 1 tablet (10 mg total) by mouth at bedtime. 08/25/20  Yes Masoud, Renda Rolls, MD  famotidine (PEPCID) 20 MG tablet Take 20 mg by mouth 2 (two) times daily. 11/02/17  Yes [provider]  hydrochlorothiazide (HYDRODIURIL) 12.5 MG tablet Take 12.5 mg by mouth daily. 04/14/22  Yes [provider]  Multiple Vitamins-Minerals (ONE-A-DAY WOMENS PO) Take 1 tablet by mouth daily.   Yes [provider]  PARoxetine  (PAXIL) 10 MG tablet Take 10 mg by mouth daily. 07/29/14  Yes [provider]  Rayburn Ma Oil (ARTIFICIAL TEARS) ointment Place 1 drop into both eyes as needed.   Yes [provider]  acetaminophen (TYLENOL) 325 MG tablet Take 650 mg by mouth every 6 (six) hours as needed.    [provider]  amLODipine (NORVASC) 5 MG tablet Take 1 tablet (5 mg total) by mouth daily. Patient not taking: Reported on 04/30/2022 10/14/21   Dimple Nanas, MD  clonazePAM (KLONOPIN) 0.5 MG tablet Take 1 tablet (0.5 mg total) by mouth daily as needed. Patient not taking: Reported on 04/30/2022 10/15/21   Leeroy Bock, MD  polyethylene glycol (MIRALAX / GLYCOLAX) 17 g packet Take 17 g by mouth daily as needed for moderate constipation or severe constipation. 10/13/21   Dimple Nanas, MD    Physical Exam: Vitals:   04/30/22 1200 04/30/22 1224 04/30/22 1230 04/30/22 1300  BP: (!) 108/50  (!) 98/46 (!) 104/48  Pulse: 63  (!) 58 64  Resp: 18  14 16   Temp:  99.3 F (37.4 C)    TempSrc:  Oral    SpO2: 99%  99% 100%  Weight:      Height:       Physical Exam Vitals and nursing note reviewed.  Constitutional:      Comments: Oriented to person not to place or time. Thin and Frail  HENT:     Head: Normocephalic and atraumatic.     Mouth/Throat:     Comments: Dry Eyes:     Extraocular Movements: Extraocular movements intact.     Pupils: Pupils are equal, round, and reactive to light.  Cardiovascular:     Rate and Rhythm: Bradycardia present.  Pulmonary:     Effort: Pulmonary effort is normal.     Breath sounds: Examination of the left-lower field reveals rhonchi. Rhonchi present.  Abdominal:     General: Bowel sounds are normal.     Palpations: Abdomen is soft.  Musculoskeletal:        General: Normal range of motion.     Cervical back: Normal range of motion and neck supple.  Skin:    General: Skin is warm and dry.  Neurological:     Mental Status: She  is alert.     Comments: Weakness  Psychiatric:        Mood and Affect: Mood normal.        Behavior: Behavior normal.     Data Reviewed: Relevant notes from primary care and specialist visits, past discharge summaries as available in EHR, including Care Everywhere. Prior diagnostic testing as pertinent to current admission diagnoses Updated medications and problem lists for reconciliation ED course, including vitals, labs, imaging, treatment and response to treatment Triage notes, nursing and pharmacy notes and ED provider's notes Notable results as noted in HPI Procalcitonin 1.79, sodium 135, potassium 3.1, chloride 103, bicarb  23, glucose 121, BUN 31, creatinine 1.52, calcium 7.9, total protein 7.1, albumin 3.4, AST 64, ALT 41, alkaline phosphatase 82, total bilirubin 0.6, troponin 18, white count 8.0, hemoglobin 11.1, hematocrit 32.3, platelet count 201 Chest x-ray reviewed by me shows Patchy airspace disease at the left lung base compatible with pneumonia. Twelve-lead EKG reviewed by me shows sinus rhythm. There are no new results to review at this time.  Assessment and Plan: * COVID-19 Patient presents to the emergency room for evaluation of weakness and shortness of breath and had COVID-19 PCR is positive. She was noted to be hypoxic with room air pulse oximetry of 88% Patient is unvaccinated against the COVID-19 virus We will treat patient empirically with remdesivir Continue oxygen supplementation to maintain pulse oximetry greater than 92% Supportive care with vitamins, antitussives and bronchodilator therapy  Lobar pneumonia (HCC) Patient presents for evaluation of shortness of breath and weakness and noted to have a left lower lobe pneumonia on chest x-ray. Procalcitonin level is elevated suggestive of possible superimposed bacterial infection We will treat patient empirically with Rocephin and doxycycline  Hypokalemia Most likely secondary to diuretic use Supplement  potassium Check magnesium levels  AKI (acute kidney injury) (HCC) Most likely secondary to diuretic therapy Patient has a baseline serum creatinine of 0.8 and today on admission it is 1.5 to Hold hydrochlorothiazide for now Gentle IV fluid hydration Repeat renal parameters in a.m.  Essential hypertension Hold hydrochlorothiazide and amlodipine since patient is normotensive.  Dementia without behavioral disturbance (HCC) Continue donepezil  CVA (cerebral vascular accident) [facility]) Patient has a history of CVA Continue DAPT and atorvastatin  Depression Continue Paxil      Advance Care Planning:   Code Status: DNR   Consults: None  Family Communication: Greater than 50% of time was spent discussing patient's condition and plan of care with her daughter and healthcare power of attorney Gabriella Rogers over the phone.  All questions and concerns have been addressed.  She verbalizes understanding and agrees with the plan.  CODE STATUS was discussed and patient is a DO NOT RESUSCITATE  Severity of Illness: The appropriate patient status for this patient is INPATIENT. Inpatient status is judged to be reasonable and necessary in order to provide the required intensity of service to ensure the patient's safety. The patient's presenting symptoms, physical exam findings, and initial radiographic and laboratory data in the context of their chronic comorbidities is felt to place them at high risk for further clinical deterioration. Furthermore, it is not anticipated that the patient will be medically stable for discharge from the hospital within 2 midnights of admission.   * I certify that at the point of admission it is my clinical judgment that the patient will require inpatient hospital care spanning beyond 2 midnights from the point of admission due to high intensity of service, high risk for further deterioration and high frequency of surveillance required.*  Author: Lucile Shutters,  MD 04/30/2022 2:42 PM  For on call review [email or web address].

## 2022-04-30 NOTE — Assessment & Plan Note (Addendum)
Most likely secondary to diuretic use Supplement potassium Check magnesium levels 

## 2022-04-30 NOTE — Assessment & Plan Note (Signed)
Hold hydrochlorothiazide and amlodipine since patient is normotensive.

## 2022-04-30 NOTE — ED Notes (Signed)
Contact daughter [name], RN at [phone] with updates/concerns.

## 2022-04-30 NOTE — Assessment & Plan Note (Signed)
Most likely secondary to diuretic therapy Patient has a baseline serum creatinine of 0.8 and today on admission it is 1.5 to Hold hydrochlorothiazide for now Gentle IV fluid hydration Repeat renal parameters in a.m.

## 2022-05-01 ENCOUNTER — Encounter: Payer: Self-pay | Admitting: Internal Medicine

## 2022-05-01 DIAGNOSIS — E876 Hypokalemia: Secondary | ICD-10-CM

## 2022-05-01 DIAGNOSIS — N179 Acute kidney failure, unspecified: Secondary | ICD-10-CM

## 2022-05-01 DIAGNOSIS — J181 Lobar pneumonia, unspecified organism: Secondary | ICD-10-CM | POA: Diagnosis not present

## 2022-05-01 DIAGNOSIS — U071 COVID-19: Secondary | ICD-10-CM | POA: Diagnosis not present

## 2022-05-01 LAB — CBC WITH DIFFERENTIAL/PLATELET
Abs Immature Granulocytes: 0.05 10*3/uL (ref 0.00–0.07)
Basophils Absolute: 0 10*3/uL (ref 0.0–0.1)
Basophils Relative: 0 %
Eosinophils Absolute: 0 10*3/uL (ref 0.0–0.5)
Eosinophils Relative: 0 %
HCT: 29.6 % — ABNORMAL LOW (ref 36.0–46.0)
Hemoglobin: 10.2 g/dL — ABNORMAL LOW (ref 12.0–15.0)
Immature Granulocytes: 1 %
Lymphocytes Relative: 8 %
Lymphs Abs: 0.8 10*3/uL (ref 0.7–4.0)
MCH: 34.8 pg — ABNORMAL HIGH (ref 26.0–34.0)
MCHC: 34.5 g/dL (ref 30.0–36.0)
MCV: 101 fL — ABNORMAL HIGH (ref 80.0–100.0)
Monocytes Absolute: 0.5 10*3/uL (ref 0.1–1.0)
Monocytes Relative: 5 %
Neutro Abs: 8.5 10*3/uL — ABNORMAL HIGH (ref 1.7–7.7)
Neutrophils Relative %: 86 %
Platelets: 184 10*3/uL (ref 150–400)
RBC: 2.93 MIL/uL — ABNORMAL LOW (ref 3.87–5.11)
RDW: 14 % (ref 11.5–15.5)
WBC: 9.8 10*3/uL (ref 4.0–10.5)
nRBC: 0 % (ref 0.0–0.2)

## 2022-05-01 LAB — COMPREHENSIVE METABOLIC PANEL
ALT: 32 U/L (ref 0–44)
AST: 54 U/L — ABNORMAL HIGH (ref 15–41)
Albumin: 2.6 g/dL — ABNORMAL LOW (ref 3.5–5.0)
Alkaline Phosphatase: 66 U/L (ref 38–126)
Anion gap: 5 (ref 5–15)
BUN: 29 mg/dL — ABNORMAL HIGH (ref 8–23)
CO2: 20 mmol/L — ABNORMAL LOW (ref 22–32)
Calcium: 7.6 mg/dL — ABNORMAL LOW (ref 8.9–10.3)
Chloride: 111 mmol/L (ref 98–111)
Creatinine, Ser: 0.76 mg/dL (ref 0.44–1.00)
GFR, Estimated: >60 mL/min (ref >60)
Glucose, Bld: 95 mg/dL (ref 70–99)
Potassium: 3.8 mmol/L (ref 3.5–5.1)
Sodium: 136 mmol/L (ref 135–145)
Total Bilirubin: 0.6 mg/dL (ref 0.3–1.2)
Total Protein: 5.7 g/dL — ABNORMAL LOW (ref 6.5–8.1)

## 2022-05-01 LAB — PROCALCITONIN: Procalcitonin: 1.41 ng/mL

## 2022-05-01 MED ORDER — SODIUM CHLORIDE 0.9 % IV SOLN
200.0000 mg | Freq: Once | INTRAVENOUS | Status: AC
  Administered 2022-05-01: 200 mg via INTRAVENOUS

## 2022-05-01 MED ORDER — SODIUM CHLORIDE 0.9 % IV SOLN
100.0000 mg | Freq: Every day | INTRAVENOUS | Status: AC
  Administered 2022-05-02 – 2022-05-03 (×2): 100 mg via INTRAVENOUS
  Filled 2022-05-01: qty 100
  Filled 2022-05-01: qty 20

## 2022-05-01 MED ORDER — ENOXAPARIN SODIUM 40 MG/0.4ML IJ SOSY
40.0000 mg | PREFILLED_SYRINGE | INTRAMUSCULAR | Status: DC
  Administered 2022-05-01 – 2022-05-03 (×2): 40 mg via SUBCUTANEOUS
  Filled 2022-05-01 (×3): qty 0.4

## 2022-05-01 MED ORDER — PHENOL 1.4 % MT LIQD
1.0000 | OROMUCOSAL | Status: DC | PRN
  Administered 2022-05-01: 1 via OROMUCOSAL
  Filled 2022-05-01: qty 177

## 2022-05-01 NOTE — Progress Notes (Addendum)
Progress Note    Gabriella Rogers  YPP:[id number] DOB: 03/04/1934  DOA: 04/30/2022 PCP: Corky Downs, MD      Brief Narrative:    Medical records reviewed and are as summarized below:  Gabriella Rogers is a 88 y.o. female with medical history significant for dementia, depression, dyslipidemia, presented from SNF because of positive COVID test, generalized weakness and shortness of breath.  He was hypoxic with oxygen saturation of 88%.  She was admitted to the hospital with COVID-19 infection with probable secondary bacterial pneumonia complicated by acute hypoxic respiratory failure.  She was noted to have AKI and hypokalemia.  She was treated with IV fluids, IV remdesivir and IV antibiotics.  She was placed on 2 L of oxygen via nasal      Assessment/Plan:   Principal Problem:   COVID-19 Active Problems:   Lobar pneumonia (HCC)   AKI (acute kidney injury) (HCC)   Hypokalemia   Depression   CVA (cerebral vascular accident) (HCC)   Dementia without behavioral disturbance (HCC)   Essential hypertension    Body mass index is 19.96 kg/m.   COVID-19 infection with probable secondary bacterial pneumonia: Continue IV remdesivir and IV antibiotics.  Acute hypoxic respiratory failure: Currently on 2 L/min oxygen via [place].  Taper off oxygen as able.  Diarrhea: Probably from COVID-19 infection.  AKI and hypokalemia: Improved.  History of stroke and dementia: Continue Lipitor, aspirin, Plavix and donepezil.  Continue supportive care  Plan discussed with Nehemiah Settle, RN, at the bedside   Diet Order             Diet regular Room service appropriate? Yes; Fluid consistency: Thin  Diet effective now                            Consultants: None  Procedures: None    Medications:    vitamin C  500 mg Oral Daily   aspirin EC  81 mg Oral Daily   atorvastatin  80 mg Oral q1800   clopidogrel  75 mg Oral Daily   dexamethasone  6 mg Oral Daily   donepezil   10 mg Oral QHS   enoxaparin (LOVENOX) injection  30 mg Subcutaneous Q24H   famotidine  10 mg Oral Daily   multivitamin with minerals   Oral Daily   PARoxetine  10 mg Oral Daily   zinc sulfate  220 mg Oral Daily   Continuous Infusions:  cefTRIAXone (ROCEPHIN)  IV     doxycycline (VIBRAMYCIN) IV     [START ON 05/02/2022] remdesivir 100 mg in sodium chloride 0.9 % 100 mL IVPB       Anti-infectives (From admission, onward)    Start     Dose/Rate Route Frequency Ordered Stop   05/02/22 1000  remdesivir 100 mg in sodium chloride 0.9 % 100 mL IVPB       See Hyperspace for full Linked Orders Report.   100 mg 200 mL/hr over 30 Minutes Intravenous Daily 05/01/22 0120 05/04/22 0959   05/01/22 2100  doxycycline (VIBRAMYCIN) 100 mg in sodium chloride 0.9 % 250 mL IVPB        100 mg 125 mL/hr over 120 Minutes Intravenous Every 12 hours 04/30/22 1602     05/01/22 1400  cefTRIAXone (ROCEPHIN) 1 g in sodium chloride 0.9 % 100 mL IVPB        1 g 200 mL/hr over 30 Minutes Intravenous Every 24 hours 04/30/22 1414  05/01/22 1000  remdesivir 100 mg in sodium chloride 0.9 % 100 mL IVPB  Status:  Discontinued       See Hyperspace for full Linked Orders Report.   100 mg 200 mL/hr over 30 Minutes Intravenous Daily 04/30/22 1413 05/01/22 0120   05/01/22 0215  remdesivir 200 mg in sodium chloride 0.9% 250 mL IVPB       See Hyperspace for full Linked Orders Report.   200 mg 580 mL/hr over 30 Minutes Intravenous Once 05/01/22 0120 05/01/22 0223   04/30/22 1500  remdesivir 200 mg in sodium chloride 0.9% 250 mL IVPB  Status:  Discontinued       See Hyperspace for full Linked Orders Report.   200 mg 580 mL/hr over 30 Minutes Intravenous Once 04/30/22 1413 05/01/22 0120   04/30/22 1500  doxycycline (VIBRAMYCIN) 100 mg in sodium chloride 0.9 % 250 mL IVPB  Status:  Discontinued        100 mg 125 mL/hr over 120 Minutes Intravenous Every 12 hours 04/30/22 1440 04/30/22 1602   04/30/22 1415  azithromycin  (ZITHROMAX) 500 mg in sodium chloride 0.9 % 250 mL IVPB  Status:  Discontinued        500 mg 250 mL/hr over 60 Minutes Intravenous Every 24 hours 04/30/22 1414 04/30/22 1440   04/30/22 1345  vancomycin (VANCOCIN) IVPB 1000 mg/200 mL premix        1,000 mg 200 mL/hr over 60 Minutes Intravenous  Once 04/30/22 1341 04/30/22 1748   04/30/22 1345  ceFEPIme (MAXIPIME) 2 g in sodium chloride 0.9 % 100 mL IVPB        2 g 200 mL/hr over 30 Minutes Intravenous  Once 04/30/22 1341 04/30/22 1438              Family Communication/Anticipated D/C date and plan/Code Status   DVT prophylaxis: enoxaparin (LOVENOX) injection 30 mg Start: 04/30/22 2200     Code Status: DNR  Family Communication: None Disposition Plan: Plan to discharge home in 1 to 2 days   Status is: Inpatient Remains inpatient appropriate because: IV antibiotics for pneumonia       Subjective:   Interval events noted.  She had diarrhea yesterday.  Her cough is better.  She thinks she is breathing better.  Improving, however, was at the bedside.  She is confused.  She said she lives at home with her husband but chart review shows that she is from SNF.    Objective:    Vitals:   04/30/22 2048 05/01/22 0031 05/01/22 0525 05/01/22 0812  BP: (!) 97/55 (!) 115/58 114/67 (!) 107/50  Pulse: (!) 57 (!) 52 (!) 51 (!) 54  Resp: 16 16 16 15   Temp: 98.2 F (36.8 C) 97.9 F (36.6 C) (!) 97.4 F (36.3 C) 97.9 F (36.6 C)  TempSrc: Oral Oral Oral   SpO2: 100% 98% 100% 100%  Weight:      Height:       No data found.   Intake/Output Summary (Last 24 hours) at 05/01/2022 0952 Last data filed at 04/30/2022 1140 Gross per 24 hour  Intake 500 ml  Output --  Net 500 ml   Filed Weights   04/30/22 1141 04/30/22 1148  Weight: 51.1 kg 51.1 kg    Exam:  GEN: NAD SKIN: Warm and dry EYES: EOMI ENT: MMM CV: RRR PULM: Left basilar rales. No wheezing ABD: soft, ND, NT, +BS CNS: AAO x 3, she answered all my  orientation questions including telling the  time on the clock, but she is very forgetful.  Non focal EXT: No edema or tenderness        Data Reviewed:   I have personally reviewed following labs and imaging studies:  Labs: Labs show the following:   Basic Metabolic Panel: Recent Labs  Lab 04/30/22 1221 04/30/22 1442 05/01/22 0650  NA 135  --  136  K 3.1*  --  3.8  CL 103  --  111  CO2 23  --  20*  GLUCOSE 121*  --  95  BUN 31*  --  29*  CREATININE 1.52*  --  0.76  CALCIUM 7.9*  --  7.6*  MG  --  2.0  --    GFR Estimated Creatinine Clearance: 40.7 mL/min (by C-G formula based on SCr of 0.76 mg/dL). Liver Function Tests: Recent Labs  Lab 04/30/22 1221 05/01/22 0650  AST 64* 54*  ALT 41 32  ALKPHOS 82 66  BILITOT 0.6 0.6  PROT 7.1 5.7*  ALBUMIN 3.4* 2.6*   No results for input(s): "LIPASE", "AMYLASE" in the last 168 hours. No results for input(s): "AMMONIA" in the last 168 hours. Coagulation profile No results for input(s): "INR", "PROTIME" in the last 168 hours.  CBC: Recent Labs  Lab 04/30/22 1221 05/01/22 0650  WBC 8.0 9.8  NEUTROABS  --  8.5*  HGB 11.1* 10.2*  HCT 32.3* 29.6*  MCV 100.3* 101.0*  PLT 201 184   Cardiac Enzymes: No results for input(s): "CKTOTAL", "CKMB", "CKMBINDEX", "TROPONINI" in the last 168 hours. BNP (last 3 results) No results for input(s): "PROBNP" in the last 8760 hours. CBG: No results for input(s): "GLUCAP" in the last 168 hours. D-Dimer: No results for input(s): "DDIMER" in the last 72 hours. Hgb A1c: No results for input(s): "HGBA1C" in the last 72 hours. Lipid Profile: No results for input(s): "CHOL", "HDL", "LDLCALC", "TRIG", "CHOLHDL", "LDLDIRECT" in the last 72 hours. Thyroid function studies: No results for input(s): "TSH", "T4TOTAL", "T3FREE", "THYROIDAB" in the last 72 hours.  Invalid input(s): "FREET3" Anemia work up: No results for input(s): "VITAMINB12", "FOLATE", "FERRITIN", "TIBC", "IRON",  "RETICCTPCT" in the last 72 hours. Sepsis Labs: Recent Labs  Lab 04/30/22 1221 05/01/22 0650  PROCALCITON 1.79 1.41  WBC 8.0 9.8    Microbiology Recent Results (from the past 240 hour(s))  Resp Panel by RT-PCR (Flu A&B, Covid) Anterior Nasal Swab     Status: Abnormal   Collection Time: 04/30/22 12:21 PM   Specimen: Anterior Nasal Swab  Result Value Ref Range Status   SARS Coronavirus 2 by RT PCR POSITIVE (A) NEGATIVE Final    Comment: (NOTE) SARS-CoV-2 target nucleic acids are DETECTED.  The SARS-CoV-2 RNA is generally detectable in upper respiratory specimens during the acute phase of infection. Positive results are indicative of the presence of the identified virus, but do not rule out bacterial infection or co-infection with other pathogens not detected by the test. Clinical correlation with patient history and other diagnostic information is necessary to determine patient infection status. The expected result is Negative.  Fact Sheet for Patients: BloggerCourse.com  Fact Sheet for Healthcare Providers: SeriousBroker.it  This test is not yet approved or cleared by the [place] FDA and  has been authorized for detection and/or diagnosis of SARS-CoV-2 by FDA under an Emergency Use Authorization (EUA).  This EUA will remain in effect (meaning this test can be used) for the duration of  the COVID-19 declaration under Section 564(b)(1) of the A ct, 21 U.S.C. section 360bbb-3(b)(1),  unless the authorization is terminated or revoked sooner.     Influenza A by PCR NEGATIVE NEGATIVE Final   Influenza B by PCR NEGATIVE NEGATIVE Final    Comment: (NOTE) The Xpert Xpress SARS-CoV-2/FLU/RSV plus assay is intended as an aid in the diagnosis of influenza from Nasopharyngeal swab specimens and should not be used as a sole basis for treatment. Nasal washings and aspirates are unacceptable for Xpert Xpress  SARS-CoV-2/FLU/RSV testing.  Fact Sheet for Patients: BloggerCourse.com  Fact Sheet for Healthcare Providers: SeriousBroker.it  This test is not yet approved or cleared by the [place] FDA and has been authorized for detection and/or diagnosis of SARS-CoV-2 by FDA under an Emergency Use Authorization (EUA). This EUA will remain in effect (meaning this test can be used) for the duration of the COVID-19 declaration under Section 564(b)(1) of the Act, 21 U.S.C. section 360bbb-3(b)(1), unless the authorization is terminated or revoked.  Performed at [facility] [address], [place], [place] [address]     Procedures and diagnostic studies:  DG Chest Portable 1 View  Result Date: 04/30/2022 CLINICAL DATA:  Shortness of breath.  COVID positive. EXAM: PORTABLE CHEST 1 VIEW COMPARISON:  01/09/2022 FINDINGS: Patchy airspace disease is seen at the left lung base without substantial pleural effusion on the lateral film. Lungs are hyperinflated suggesting emphysema. Cardiopericardial silhouette is at upper limits of normal for size. Bones are diffusely demineralized. Telemetry leads overlie the chest. IMPRESSION: Patchy airspace disease at the left lung base compatible with pneumonia. Electronically Signed   By: Kennith Center M.D.   On: 04/30/2022 11:58               LOS: 1 day   Gabriella Rogers  [facility]   Pager on [email or web address]. If 7PM-7AM, please contact night-coverage at [email or web address]     05/01/2022, 9:52 AM

## 2022-05-01 NOTE — TOC Initial Note (Signed)
Transition of Care [facility]) - Initial/Assessment Note    Patient Details  Name: Gabriella Rogers MRN: [id number] Date of Birth: 05-01-1934  Transition [facility]) CM/SW Contact:    Bridgett Larsson, LCSW Phone Number: 05/01/2022, 4:35 PM  Clinical Narrative:                 CSW spoke with pt's adult daughter, Gabriella Rogers. Pt is a resident at [facility], who assists with transportation and pharmacy needs. She receives strong support from family, including adult children and spouse. Pt's daughter reports frustration with placement and process with applying for Medicaid. LCSW provided validation and encouragement.   Pt has received rehab at [facility] twice within approximately 3 years. She has hx of receiving home health services at current residence; however, family can not recall agency. Pt's PCP is Corky Downs  Expected Discharge Plan:  (Assisted [place]) Barriers to Discharge: Continued Medical Work up   Patient Goals and CMS Choice        Expected Discharge Plan and Services Expected Discharge Plan:  (Assisted [place])       Living arrangements for the past 2 months: Assisted Living Facility [facility])                                      Prior Living Arrangements/Services Living arrangements for the past 2 months: Assisted Living Facility ([place]) Lives with:: Other (Comment) ([facility]) Patient language and need for interpreter reviewed:: Yes        Need for Family Participation in Patient Care: Yes (Comment) Care giver support system in place?: Yes (comment)   Criminal Activity/Legal Involvement Pertinent to Current Situation/Hospitalization: No - Comment as needed  Activities of Daily Living Home Assistive Devices/Equipment: None (unknown) ADL Screening (condition at time of admission) Patient's cognitive ability adequate to safely complete daily activities?: Yes Is the patient deaf or  have difficulty hearing?: No Does the patient have difficulty seeing, even when wearing glasses/contacts?: No Does the patient have difficulty concentrating, remembering, or making decisions?: Yes Patient able to express need for assistance with ADLs?: No Does the patient have difficulty dressing or bathing?: Yes Independently performs ADLs?: No Communication: Dependent Is this a change from baseline?: Pre-admission baseline Dressing (OT): Dependent Is this a change from baseline?: Pre-admission baseline Grooming: Dependent Is this a change from baseline?: Pre-admission baseline Feeding: Needs assistance Is this a change from baseline?: Pre-admission baseline Bathing: Dependent Toileting: Dependent Is this a change from baseline?: Pre-admission baseline In/Out Bed: Needs assistance Is this a change from baseline?: Pre-admission baseline Walks in Home: Needs assistance Is this a change from baseline?: Pre-admission baseline Does the patient have difficulty walking or climbing stairs?: Yes Weakness of Legs: Both Weakness of Arms/Hands: Both  Permission Sought/Granted Permission sought to share information with : Family Supports Gabriella Rogers, adult daughter) Permission granted to share information with : Yes, Verbal Permission Granted              Emotional Assessment       Orientation: : Oriented to Self Alcohol / Substance Use: Not Applicable    Admission diagnosis:  HCAP (healthcare-associated pneumonia) [J18.9] Acute hypoxemic respiratory failure due to COVID-19 (HCC) [U07.1, J96.01] COVID-19 [U07.1] Patient Active Problem List   Diagnosis Date Noted   COVID-19 04/30/2022   Lobar pneumonia (HCC) 04/30/2022   Dementia without behavioral disturbance (HCC) 04/30/2022  Essential hypertension 04/30/2022   Ear drainage, bilateral    Sensorineural hearing loss (SNHL) of both ears    AKI (acute kidney injury) (HCC) 10/07/2021   Hypokalemia 10/07/2021   Macrocytic anemia  10/07/2021   Leukocytosis 10/07/2021   Generalized weakness 10/07/2021   CVA (cerebral vascular accident) (HCC) 10/07/2021   Urinary tract infection without hematuria 04/13/2021   Acute cystitis without hematuria 04/13/2021   Urethritis 04/13/2021   Painful urination 01/07/2021   Wax in ear 02/26/2020   Acute CVA (cerebrovascular accident) (HCC) 01/26/2020   Depression 11/26/2015   Dizziness 11/26/2015   Hyperlipidemia, unspecified 11/26/2015   Headache, migraine 11/26/2015   Osteoporosis, post-menopausal 11/26/2015   H/O total knee replacement 11/23/2015   Arthropathy, traumatic, knee 03/05/2015   Brain disorder 03/27/2014   Cervical dystonia 01/24/2012   PCP:  Corky Downs, MD Pharmacy:   [facility] - [place], [place] - [address] [place] [address] Phone: [phone] Fax: [phone]     Social Determinants of Health (SDOH) Interventions Housing Interventions: Intervention Not Indicated  Readmission Risk Interventions    05/01/2022    4:23 PM  Readmission Risk Prevention Plan  Transportation Screening Complete  PCP or Specialist Appt within 3-5 Days Complete  HRI or Home Care Consult Complete  Social Work Consult for Recovery Care Planning/Counseling Complete  Palliative Care Screening Not Applicable  Medication Review Oceanographer) Complete

## 2022-05-02 DIAGNOSIS — U071 COVID-19: Secondary | ICD-10-CM | POA: Diagnosis not present

## 2022-05-02 DIAGNOSIS — F0390 Unspecified dementia without behavioral disturbance: Secondary | ICD-10-CM | POA: Diagnosis not present

## 2022-05-02 DIAGNOSIS — R001 Bradycardia, unspecified: Secondary | ICD-10-CM | POA: Diagnosis not present

## 2022-05-02 DIAGNOSIS — J181 Lobar pneumonia, unspecified organism: Secondary | ICD-10-CM | POA: Diagnosis not present

## 2022-05-02 NOTE — Evaluation (Signed)
Physical Therapy Evaluation Patient Details Name: Gabriella Rogers MRN: [id number] DOB: 19-Aug-1933 Today's Date: 05/02/2022  History of Present Illness  87 y.o. female with medical history significant for dementia, depression, dyslipidemia, presented from ALF with (+) COVID test, generalized weakness and shortness of breath.  Clinical Impression  Pt showed some initial resistance to doing a lot with PT, but with gentle encouragement was agreeable to try and get up, get to recliner.  She struggled with keeping weight forward with transition to standing and while up in the walker but moved very well in bed.  She reports she used to walk a lot but has been weaker and more limited recently.  Pt with some confusion, but did hold reasonable conversation and was able to appropriately answer some orientation questions. Pt will benefit from continued PT to address the functional limitations listed below.    Recommendations for follow up therapy are one component of a multi-disciplinary discharge planning process, led by the attending physician.  Recommendations may be updated based on patient status, additional functional criteria and insurance authorization.  Follow Up Recommendations  (PT at her ALF)      Assistance Recommended at Discharge Frequent or constant Supervision/Assistance  Patient can return home with the following  A lot of help with walking and/or transfers;A little help with bathing/dressing/bathroom    Equipment Recommendations None recommended by PT  Recommendations for Other Services       Functional Status Assessment Patient has had a recent decline in their functional status and demonstrates the ability to make significant improvements in function in a reasonable and predictable amount of time.     Precautions / Restrictions Precautions Precautions: Fall Restrictions Weight Bearing Restrictions: No      Mobility  Bed Mobility Overal bed mobility: Modified Independent              General bed mobility comments: Pt intiailly not wishing to get up with with a little encouragement was able to get to sitting EOB w/o direct assist.    Transfers Overall transfer level: Needs assistance Equipment used: Rolling walker (2 wheels) Transfers: Sit to/from Stand Sit to Stand: Mod assist           General transfer comment: Encouarged pt to try standing on her own, she did not have success from standard and 2" elevated bed.  PT did need to give signficant direct assist to insure she got her hips forward and up into the walker.  PT required direct assist the entire time as she had constant retro lean despite much cuing and assist.    Ambulation/Gait               General Gait Details: Pt unable to take actual steps, She shuffled a little while PT faciliated transition bed to recliner but ultimatley she could not keep her weight forward enough in the walker (dispite much assist) to attempt actual ambulation/steppage  Stairs            Wheelchair Mobility    Modified Rankin (Stroke Patients Only)       Balance Overall balance assessment: Needs assistance Sitting-balance support: No upper extremity supported Sitting balance-Leahy Scale: Good Sitting balance - Comments: pt able to reach down, grab shoes, cross legs and don b/l shoes w/o PT assist   Standing balance support: Bilateral upper extremity supported Standing balance-Leahy Scale: Poor Standing balance comment: Pt with signficant retro lean, despite much direct assist she could not appropriate get weight forward into the walker  Pertinent Vitals/Pain Pain Assessment Pain Assessment: No/denies pain    Home Living Family/patient expects to be discharged to:: Assisted living                 Home Equipment: Grab bars - tub/shower;Rolling Walker (2 wheels);Cane - single point;Rollator (4 wheels) (pt reports a 4WW this session, prior doc  stated FWW)      Prior Function Prior Level of Function : Patient poor historian/Family not available (a few months ago pt was apparently able to ambulate confidently at her ALF with AD - appears that recently she has been much more limited)                     Hand Dominance        Extremity/Trunk Assessment   Upper Extremity Assessment Upper Extremity Assessment: Generalized weakness    Lower Extremity Assessment Lower Extremity Assessment: Generalized weakness RLE Deficits / Details: R knee lacks flexion ROM to 90       Communication   Communication: No difficulties  Cognition Arousal/Alertness: Awake/alert Behavior During Therapy: Restless Overall Cognitive Status: History of cognitive impairments - at baseline                                 General Comments: Oriented to self, location, situation, not date and generally showed poor situational awarenss        General Comments General comments (skin integrity, edema, etc.): Pt moved well in bed, but showed poor standing balance and safety awareness.    Exercises     Assessment/Plan    PT Assessment Patient needs continued PT services  PT Problem List Decreased strength;Decreased range of motion;Decreased activity tolerance;Decreased balance;Decreased mobility;Decreased cognition;Decreased knowledge of use of DME;Decreased safety awareness       PT Treatment Interventions DME instruction;Gait training;Functional mobility training;Therapeutic activities;Balance training;Therapeutic exercise;Neuromuscular re-education;Cognitive remediation;Patient/family education    PT Goals (Current goals can be found in the Care Plan section)  Acute Rehab PT Goals Patient Stated Goal: get walking better PT Goal Formulation: With patient Time For Goal Achievement: 05/15/22 Potential to Achieve Goals: Fair    Frequency Min 2X/week     Co-evaluation               AM-PAC PT "6 Clicks" Mobility   Outcome Measure Help needed turning from your back to your side while in a flat bed without using bedrails?: None Help needed moving from lying on your back to sitting on the side of a flat bed without using bedrails?: None Help needed moving to and from a bed to a chair (including a wheelchair)?: A Lot Help needed standing up from a chair using your arms (e.g., wheelchair or bedside chair)?: A Lot Help needed to walk in hospital room?: Total Help needed climbing 3-5 steps with a railing? : Total 6 Click Score: 14    End of Session Equipment Utilized During Treatment: Gait belt Activity Tolerance: Patient tolerated treatment well Patient left: with chair alarm set;with call bell/phone within reach;with nursing/sitter in room Nurse Communication: Mobility status PT Visit Diagnosis: Muscle weakness (generalized) (M62.81);Difficulty in walking, not elsewhere classified (R26.2);Unsteadiness on feet (R26.81)    Time: 7169-6789 PT Time Calculation (min) (ACUTE ONLY): 28 min   Charges:     PT Treatments $Therapeutic Activity: 8-22 mins        Malachi Pro, DPT 05/02/2022, 4:50 PM

## 2022-05-02 NOTE — Progress Notes (Signed)
Pt is refusing all medications and IV antibiotics. Attempted to call Shon Hough daughter. No answer

## 2022-05-02 NOTE — Progress Notes (Addendum)
Progress Note    Gabriella Rogers  QTM:[id number] DOB: 1934-05-05  DOA: 04/30/2022 PCP: Corky Downs, MD      Brief Narrative:    Medical records reviewed and are as summarized below:  Gabriella Rogers is a 88 y.o. female with medical history significant for dementia, depression, dyslipidemia, presented from SNF because of positive COVID test, generalized weakness and shortness of breath.  He was hypoxic with oxygen saturation of 88%.  She was admitted to the hospital with COVID-19 infection with probable secondary bacterial pneumonia complicated by acute hypoxic respiratory failure.  She was noted to have AKI and hypokalemia.  She was treated with IV fluids, IV remdesivir and IV antibiotics.  She was placed on 2 L of oxygen via nasal      Assessment/Plan:   Principal Problem:   COVID-19 Active Problems:   Lobar pneumonia (HCC)   AKI (acute kidney injury) (HCC)   Hypokalemia   Depression   CVA (cerebral vascular accident) (HCC)   Dementia without behavioral disturbance (HCC)   Essential hypertension   Sinus bradycardia    Body mass index is 19.96 kg/m.   COVID-19 infection with probable secondary bacterial pneumonia: Continue IV remdesivir, dexamethasone and antibiotics.  Acute hypoxic respiratory failure: Resolved.  She is tolerating room air.  Sinus bradycardia: Heart rate was in the 40s and 50s.  Monitor on telemetry.  Discontinue donepezil  Diarrhea: Improved.  Probably from COVID-19 infection.  AKI and hypokalemia: Improved.  History of stroke and dementia: Continue aspirin, Plavix and Lipitor.  Donepezil has been discontinued.  Continue supportive care  Fatigue and general weakness, history of falls: Consult PT  Plan discussed with her daughter over the phone.  He said patient left from the memory care unit/ALF and also reported that patient has a history of recurrent falls.  She was wondering whether donepezil had anything to do with this.   Diet  Order             Diet regular Room service appropriate? Yes; Fluid consistency: Thin  Diet effective now                            Consultants: None  Procedures: None    Medications:    vitamin C  500 mg Oral Daily   aspirin EC  81 mg Oral Daily   atorvastatin  80 mg Oral q1800   clopidogrel  75 mg Oral Daily   dexamethasone  6 mg Oral Daily   enoxaparin (LOVENOX) injection  40 mg Subcutaneous Q24H   famotidine  10 mg Oral Daily   multivitamin with minerals   Oral Daily   PARoxetine  10 mg Oral Daily   zinc sulfate  220 mg Oral Daily   Continuous Infusions:  cefTRIAXone (ROCEPHIN)  IV Stopped (05/01/22 1419)   doxycycline (VIBRAMYCIN) IV 100 mg (05/02/22 1041)   remdesivir 100 mg in sodium chloride 0.9 % 100 mL IVPB Stopped (05/02/22 1038)     Anti-infectives (From admission, onward)    Start     Dose/Rate Route Frequency Ordered Stop   05/02/22 1000  remdesivir 100 mg in sodium chloride 0.9 % 100 mL IVPB       See Hyperspace for full Linked Orders Report.   100 mg 200 mL/hr over 30 Minutes Intravenous Daily 05/01/22 0120 05/04/22 0959   05/01/22 2100  doxycycline (VIBRAMYCIN) 100 mg in sodium chloride 0.9 % 250 mL IVPB  100 mg 125 mL/hr over 120 Minutes Intravenous Every 12 hours 04/30/22 1602     05/01/22 1400  cefTRIAXone (ROCEPHIN) 1 g in sodium chloride 0.9 % 100 mL IVPB        1 g 200 mL/hr over 30 Minutes Intravenous Every 24 hours 04/30/22 1414     05/01/22 1000  remdesivir 100 mg in sodium chloride 0.9 % 100 mL IVPB  Status:  Discontinued       See Hyperspace for full Linked Orders Report.   100 mg 200 mL/hr over 30 Minutes Intravenous Daily 04/30/22 1413 05/01/22 0120   05/01/22 0215  remdesivir 200 mg in sodium chloride 0.9% 250 mL IVPB       See Hyperspace for full Linked Orders Report.   200 mg 580 mL/hr over 30 Minutes Intravenous Once 05/01/22 0120 05/01/22 0223   04/30/22 1500  remdesivir 200 mg in sodium chloride 0.9% 250  mL IVPB  Status:  Discontinued       See Hyperspace for full Linked Orders Report.   200 mg 580 mL/hr over 30 Minutes Intravenous Once 04/30/22 1413 05/01/22 0120   04/30/22 1500  doxycycline (VIBRAMYCIN) 100 mg in sodium chloride 0.9 % 250 mL IVPB  Status:  Discontinued        100 mg 125 mL/hr over 120 Minutes Intravenous Every 12 hours 04/30/22 1440 04/30/22 1602   04/30/22 1415  azithromycin (ZITHROMAX) 500 mg in sodium chloride 0.9 % 250 mL IVPB  Status:  Discontinued        500 mg 250 mL/hr over 60 Minutes Intravenous Every 24 hours 04/30/22 1414 04/30/22 1440   04/30/22 1345  vancomycin (VANCOCIN) IVPB 1000 mg/200 mL premix        1,000 mg 200 mL/hr over 60 Minutes Intravenous  Once 04/30/22 1341 04/30/22 1748   04/30/22 1345  ceFEPIme (MAXIPIME) 2 g in sodium chloride 0.9 % 100 mL IVPB        2 g 200 mL/hr over 30 Minutes Intravenous  Once 04/30/22 1341 04/30/22 1438              Family Communication/Anticipated D/C date and plan/Code Status   DVT prophylaxis: enoxaparin (LOVENOX) injection 40 mg Start: 05/01/22 2200     Code Status: DNR  Family Communication: Plan discussed with [name], her daughter Disposition Plan: Plan to discharge to ALF tomorrow   Status is: Inpatient Remains inpatient appropriate because: IV antibiotics for pneumonia       Subjective:   Interval events noted.  She complains of fatigue and cough.  Breathing is better.  Robyn, RN, was at the bedside   Objective:    Vitals:   05/01/22 2103 05/02/22 0017 05/02/22 0448 05/02/22 0835  BP: (!) 113/53 116/70 (!) 148/58 (!) 149/67  Pulse: (!) 59 (!) 56 89 (!) 51  Resp: 16 18 18 17   Temp: 98.1 F (36.7 C) 97.7 F (36.5 C) 97.8 F (36.6 C) 97.8 F (36.6 C)  TempSrc:      SpO2: 94% 97% 100% 99%  Weight:      Height:       No data found.   Intake/Output Summary (Last 24 hours) at 05/02/2022 1106 Last data filed at 05/02/2022 1038 Gross per 24 hour  Intake 100 ml  Output --   Net 100 ml   Filed Weights   04/30/22 1141 04/30/22 1148  Weight: 51.1 kg 51.1 kg    Exam:  GEN: NAD SKIN: Warm and dry EYES: EOMI ENT: MMM  CV: RRR PULM: CTA B ABD: soft, ND, NT, +BS CNS: AAO x 1 (person), non focal EXT: No edema or tenderness       Data Reviewed:   I have personally reviewed following labs and imaging studies:  Labs: Labs show the following:   Basic Metabolic Panel: Recent Labs  Lab 04/30/22 1221 04/30/22 1442 05/01/22 0650  NA 135  --  136  K 3.1*  --  3.8  CL 103  --  111  CO2 23  --  20*  GLUCOSE 121*  --  95  BUN 31*  --  29*  CREATININE 1.52*  --  0.76  CALCIUM 7.9*  --  7.6*  MG  --  2.0  --    GFR Estimated Creatinine Clearance: 40.7 mL/min (by C-G formula based on SCr of 0.76 mg/dL). Liver Function Tests: Recent Labs  Lab 04/30/22 1221 05/01/22 0650  AST 64* 54*  ALT 41 32  ALKPHOS 82 66  BILITOT 0.6 0.6  PROT 7.1 5.7*  ALBUMIN 3.4* 2.6*   No results for input(s): "LIPASE", "AMYLASE" in the last 168 hours. No results for input(s): "AMMONIA" in the last 168 hours. Coagulation profile No results for input(s): "INR", "PROTIME" in the last 168 hours.  CBC: Recent Labs  Lab 04/30/22 1221 05/01/22 0650  WBC 8.0 9.8  NEUTROABS  --  8.5*  HGB 11.1* 10.2*  HCT 32.3* 29.6*  MCV 100.3* 101.0*  PLT 201 184   Cardiac Enzymes: No results for input(s): "CKTOTAL", "CKMB", "CKMBINDEX", "TROPONINI" in the last 168 hours. BNP (last 3 results) No results for input(s): "PROBNP" in the last 8760 hours. CBG: No results for input(s): "GLUCAP" in the last 168 hours. D-Dimer: No results for input(s): "DDIMER" in the last 72 hours. Hgb A1c: No results for input(s): "HGBA1C" in the last 72 hours. Lipid Profile: No results for input(s): "CHOL", "HDL", "LDLCALC", "TRIG", "CHOLHDL", "LDLDIRECT" in the last 72 hours. Thyroid function studies: No results for input(s): "TSH", "T4TOTAL", "T3FREE", "THYROIDAB" in the last 72  hours.  Invalid input(s): "FREET3" Anemia work up: No results for input(s): "VITAMINB12", "FOLATE", "FERRITIN", "TIBC", "IRON", "RETICCTPCT" in the last 72 hours. Sepsis Labs: Recent Labs  Lab 04/30/22 1221 05/01/22 0650  PROCALCITON 1.79 1.41  WBC 8.0 9.8    Microbiology Recent Results (from the past 240 hour(s))  Resp Panel by RT-PCR (Flu A&B, Covid) Anterior Nasal Swab     Status: Abnormal   Collection Time: 04/30/22 12:21 PM   Specimen: Anterior Nasal Swab  Result Value Ref Range Status   SARS Coronavirus 2 by RT PCR POSITIVE (A) NEGATIVE Final    Comment: (NOTE) SARS-CoV-2 target nucleic acids are DETECTED.  The SARS-CoV-2 RNA is generally detectable in upper respiratory specimens during the acute phase of infection. Positive results are indicative of the presence of the identified virus, but do not rule out bacterial infection or co-infection with other pathogens not detected by the test. Clinical correlation with patient history and other diagnostic information is necessary to determine patient infection status. The expected result is Negative.  Fact Sheet for Patients: BloggerCourse.comhttps://www.fda.gov/media/152166/download  Fact Sheet for Healthcare Providers: SeriousBroker.ithttps://www.fda.gov/media/152162/download  This test is not yet approved or cleared by the [place] FDA and  has been authorized for detection and/or diagnosis of SARS-CoV-2 by FDA under an Emergency Use Authorization (EUA).  This EUA will remain in effect (meaning this test can be used) for the duration of  the COVID-19 declaration under Section 564(b)(1) of the A ct,  21 U.S.C. section 360bbb-3(b)(1), unless the authorization is terminated or revoked sooner.     Influenza A by PCR NEGATIVE NEGATIVE Final   Influenza B by PCR NEGATIVE NEGATIVE Final    Comment: (NOTE) The Xpert Xpress SARS-CoV-2/FLU/RSV plus assay is intended as an aid in the diagnosis of influenza from Nasopharyngeal swab specimens  and should not be used as a sole basis for treatment. Nasal washings and aspirates are unacceptable for Xpert Xpress SARS-CoV-2/FLU/RSV testing.  Fact Sheet for Patients: EntrepreneurPulse.com.au  Fact Sheet for Healthcare Providers: IncredibleEmployment.be  This test is not yet approved or cleared by the [place] FDA and has been authorized for detection and/or diagnosis of SARS-CoV-2 by FDA under an Emergency Use Authorization (EUA). This EUA will remain in effect (meaning this test can be used) for the duration of the COVID-19 declaration under Section 564(b)(1) of the Act, 21 U.S.C. section 360bbb-3(b)(1), unless the authorization is terminated or revoked.  Performed at [facility], [address] [place], [place] [address]     Procedures and diagnostic studies:  DG Chest Portable 1 View  Result Date: 04/30/2022 CLINICAL DATA:  Shortness of breath.  COVID positive. EXAM: PORTABLE CHEST 1 VIEW COMPARISON:  01/09/2022 FINDINGS: Patchy airspace disease is seen at the left lung base without substantial pleural effusion on the lateral film. Lungs are hyperinflated suggesting emphysema. Cardiopericardial silhouette is at upper limits of normal for size. Bones are diffusely demineralized. Telemetry leads overlie the chest. IMPRESSION: Patchy airspace disease at the left lung base compatible with pneumonia. Electronically Signed   By: Misty Stanley M.D.   On: 04/30/2022 11:58               LOS: 2 days   Arriona Prest  [facility]   Pager on [email or web address]. If 7PM-7AM, please contact night-coverage at [email or web address]     05/02/2022, 11:06 AM

## 2022-05-02 NOTE — TOC Progression Note (Signed)
Transition of Care [facility]) - Progression Note    Patient Details  Name: Gabriella Rogers MRN: [id number] Date of Birth: [birth year]  Transition of Care [facility]) CM/SW Contact  Bridgett Larsson, [place] Phone Number: 05/02/2022, 9:25 AM  Clinical Narrative:    CSW attempted to inquire with staff at [facility] [phone] about whether an assessment needs to be completed by staff, prior to pt returning to residence. CSW left message for a return call.   Expected Discharge Plan:  (Assisted [facility]) Barriers to Discharge: Continued Medical Work up  Expected Discharge Plan and Services Expected Discharge Plan:  (Assisted [place])       Living arrangements for the past 2 months: Assisted Living Facility [facility])                                       Social Determinants of Health (SDOH) Interventions Housing Interventions: Intervention Not Indicated  Readmission Risk Interventions    05/01/2022    4:23 PM  Readmission Risk Prevention Plan  Transportation Screening Complete  PCP or Specialist Appt within 3-5 Days Complete  HRI or Home Care Consult Complete  Social Work Consult for Recovery Care Planning/Counseling Complete  Palliative Care Screening Not Applicable  Medication Review Oceanographer) Complete

## 2022-05-03 DIAGNOSIS — U071 COVID-19: Secondary | ICD-10-CM | POA: Diagnosis not present

## 2022-05-03 MED ORDER — DOXYCYCLINE HYCLATE 100 MG PO CAPS: 100.0000 mg | ORAL_CAPSULE | Freq: Two times a day (BID) | ORAL | 0 refills | Status: DC

## 2022-05-03 MED ORDER — DOXYCYCLINE HYCLATE 100 MG PO TABS
100.0000 mg | ORAL_TABLET | Freq: Two times a day (BID) | ORAL | Status: DC
  Administered 2022-05-03 – 2022-05-04 (×3): 100 mg via ORAL
  Filled 2022-05-03 (×3): qty 1

## 2022-05-03 MED ORDER — AMOXICILLIN-POT CLAVULANATE 875-125 MG PO TABS: 1.0000 | ORAL_TABLET | Freq: Two times a day (BID) | ORAL | 0 refills | Status: DC

## 2022-05-03 NOTE — Care Management Important Message (Signed)
Important Message  Patient Details  Name: Gabriella Rogers MRN: [id number] Date of Birth: 09/13/1933   Medicare Important Message Given:  N/A - LOS <3 / Initial given by admissions     [name] 05/03/2022, 9:27 AM

## 2022-05-03 NOTE — TOC Progression Note (Addendum)
Transition of Care [facility]) - Progression Note    Patient Details  Name: CLARRISSA SHIMKUS MRN: [id number] Date of Birth: 12/12/1933  Transition of Care [facility]) CM/SW Contact  Tempie Hoist, [place] Phone Number: 05/03/2022, 5:11 PM  Clinical Narrative:     TOC has attempted to contact ALF today and has been unable to contact Dottie at [facility]. TOC has called [phone]. TOC needs to know if the patient will need a new Fl2. TOC also left a voicemail for other staff members.  Daughter has been contacted. TOC given an additional number: Aya at [facility] [phone].  1720: TOC attempted to contact Dottie today. Aya is contacting Dottie at the ALF. TOC is making a new Fl2 and arraning HHPT.  Expected Discharge Plan:  (Assisted [facility]) Barriers to Discharge: Continued Medical Work up  Expected Discharge Plan and Services Expected Discharge Plan:  (Assisted [place])       Living arrangements for the past 2 months: Assisted Living Facility [place]) Expected Discharge Date: 05/03/22                                     Social Determinants of Health (SDOH) Interventions Housing Interventions: Intervention Not Indicated  Readmission Risk Interventions    05/01/2022    4:23 PM  Readmission Risk Prevention Plan  Transportation Screening Complete  PCP or Specialist Appt within 3-5 Days Complete  HRI or Home Care Consult Complete  Social Work Consult for Recovery Care Planning/Counseling Complete  Palliative Care Screening Not Applicable  Medication Review Oceanographer) Complete

## 2022-05-03 NOTE — Progress Notes (Signed)
PHARMACIST - PHYSICIAN COMMUNICATION  CONCERNING: Antibiotic IV to Oral Route Change Policy  RECOMMENDATION: This patient is receiving doxycycline by the intravenous route.  Based on criteria approved by the Pharmacy and Therapeutics Committee, the antibiotic(s) is/are being converted to the equivalent oral dose form(s).   DESCRIPTION: These criteria include: Patient being treated for a respiratory tract infection, urinary tract infection, cellulitis or clostridium difficile associated diarrhea if on metronidazole The patient is not neutropenic and does not exhibit a GI malabsorption state The patient is eating (either orally or via tube) and/or has been taking other orally administered medications for a least 24 hours The patient is improving clinically and has a Tmax < 100.5  If you have questions about this conversion, please contact the Pharmacy Department   Tressie Ellis 05/03/22

## 2022-05-03 NOTE — Discharge Summary (Addendum)
Physician Discharge Summary   Patient: Gabriella Rogers MRN: [id number] DOB: 1933-10-01  Admit date:     04/30/2022  Discharge date: 05/03/22  Discharge Physician: Lurene Shadow   PCP: Corky Downs, MD   Recommendations at discharge:   Follow-up with PCP in 1 week  Discharge Diagnoses: Principal Problem:   COVID-19 Active Problems:   Lobar pneumonia (HCC)   AKI (acute kidney injury) (HCC)   Hypokalemia   Depression   CVA (cerebral vascular accident) (HCC)   Dementia without behavioral disturbance (HCC)   Essential hypertension   Sinus bradycardia  Resolved Problems:   * No resolved hospital problems. [facility] Course:  Ms. Gabriella Rogers is a 88 y.o. female with medical history significant for dementia, depression, dyslipidemia, presented from SNF because of positive COVID test, generalized weakness and shortness of breath.  She was hypoxic with oxygen saturation of 88%.   She was admitted to the hospital with COVID-19 infection with probable secondary bacterial pneumonia complicated by acute hypoxic respiratory failure.  She was noted to have AKI and hypokalemia.  She was treated with IV fluids, IV remdesivir and IV antibiotics.  She was placed on 2 L of oxygen via nasal cannula.  Hypoxia, hypokalemia and AKI have resolved.  She was also found to have sinus bradycardia on telemetry.  She takes Aricept for dementia.  Aricept has been discontinued because of bradycardia.  She is deemed stable for discharge to the assisted living facility today.  Discharge plan was discussed with [name], daughter, over the phone.          Consultants: None Procedures performed: None  Disposition: Assisted living Diet recommendation:  Discharge Diet Orders (From admission, onward)     Start     Ordered   05/03/22 0000  Diet - low sodium heart healthy        05/03/22 1032           Cardiac diet DISCHARGE MEDICATION: Allergies as of 05/03/2022       Reactions   Actonel   [risedronate Sodium]    Piper    Pneumococcal Vaccine Other (See Comments)   WEAKNESS, DISORIENTED, "ARM SORE, RED, HURTING"   Risedronate Other (See Comments)   REDNESS IN FACE   Alendronate Rash   Aleve [naproxen Sodium] Rash   Pravastatin Rash        Medication List     STOP taking these medications    amLODipine 5 MG tablet Commonly known as: NORVASC   clonazePAM 0.5 MG tablet Commonly known as: KLONOPIN   donepezil 10 MG tablet Commonly known as: ARICEPT       TAKE these medications    acetaminophen 325 MG tablet Commonly known as: TYLENOL Take 650 mg by mouth every 6 (six) hours as needed.   amoxicillin-clavulanate 875-125 MG tablet Commonly known as: AUGMENTIN Take 1 tablet by mouth 2 (two) times daily for 2 days.   artificial tears ointment Place 1 drop into both eyes as needed.   aspirin EC 81 MG tablet Take 1 tablet (81 mg total) by mouth daily. Swallow whole.   atorvastatin 80 MG tablet Commonly known as: LIPITOR Take 1 tablet (80 mg total) by mouth daily at 6 PM.   clopidogrel 75 MG tablet Commonly known as: PLAVIX Take 1 tablet (75 mg total) by mouth daily.   doxycycline 100 MG capsule Commonly known as: VIBRAMYCIN Take 1 capsule (100 mg total) by mouth 2 (two) times daily for 2 days.   famotidine  20 MG tablet Commonly known as: PEPCID Take 20 mg by mouth 2 (two) times daily.   hydrochlorothiazide 12.5 MG tablet Commonly known as: HYDRODIURIL Take 12.5 mg by mouth daily.   ONE-A-DAY WOMENS PO Take 1 tablet by mouth daily.   PARoxetine 10 MG tablet Commonly known as: PAXIL Take 10 mg by mouth daily.   polyethylene glycol 17 g packet Commonly known as: MIRALAX / GLYCOLAX Take 17 g by mouth daily as needed for moderate constipation or severe constipation.        Discharge Exam: Filed Weights   04/30/22 1141 04/30/22 1148  Weight: 51.1 kg 51.1 kg   GEN: NAD SKIN: Warm and dry EYES: Bo pallor or icterus ENT: MMM CV:  RRR PULM: CTA B ABD: soft, ND, NT, +BS CNS: AAO x 3, non focal EXT: No edema or tenderness   Condition at discharge: stable  The results of significant diagnostics from this hospitalization (including imaging, microbiology, ancillary and laboratory) are listed below for reference.   Imaging Studies: DG Chest Portable 1 View  Result Date: 04/30/2022 CLINICAL DATA:  Shortness of breath.  COVID positive. EXAM: PORTABLE CHEST 1 VIEW COMPARISON:  01/09/2022 FINDINGS: Patchy airspace disease is seen at the left lung base without substantial pleural effusion on the lateral film. Lungs are hyperinflated suggesting emphysema. Cardiopericardial silhouette is at upper limits of normal for size. Bones are diffusely demineralized. Telemetry leads overlie the chest. IMPRESSION: Patchy airspace disease at the left lung base compatible with pneumonia. Electronically Signed   By: Kennith Center M.D.   On: 04/30/2022 11:58    Microbiology: Results for orders placed or performed during the hospital encounter of 04/30/22  Resp Panel by RT-PCR (Flu A&B, Covid) Anterior Nasal Swab     Status: Abnormal   Collection Time: 04/30/22 12:21 PM   Specimen: Anterior Nasal Swab  Result Value Ref Range Status   SARS Coronavirus 2 by RT PCR POSITIVE (A) NEGATIVE Final    Comment: (NOTE) SARS-CoV-2 target nucleic acids are DETECTED.  The SARS-CoV-2 RNA is generally detectable in upper respiratory specimens during the acute phase of infection. Positive results are indicative of the presence of the identified virus, but do not rule out bacterial infection or co-infection with other pathogens not detected by the test. Clinical correlation with patient history and other diagnostic information is necessary to determine patient infection status. The expected result is Negative.  Fact Sheet for Patients: BloggerCourse.com  Fact Sheet for Healthcare  Providers: SeriousBroker.it  This test is not yet approved or cleared by the [place] FDA and  has been authorized for detection and/or diagnosis of SARS-CoV-2 by FDA under an Emergency Use Authorization (EUA).  This EUA will remain in effect (meaning this test can be used) for the duration of  the COVID-19 declaration under Section 564(b)(1) of the A ct, 21 U.S.C. section 360bbb-3(b)(1), unless the authorization is terminated or revoked sooner.     Influenza A by PCR NEGATIVE NEGATIVE Final   Influenza B by PCR NEGATIVE NEGATIVE Final    Comment: (NOTE) The Xpert Xpress SARS-CoV-2/FLU/RSV plus assay is intended as an aid in the diagnosis of influenza from Nasopharyngeal swab specimens and should not be used as a sole basis for treatment. Nasal washings and aspirates are unacceptable for Xpert Xpress SARS-CoV-2/FLU/RSV testing.  Fact Sheet for Patients: BloggerCourse.com  Fact Sheet for Healthcare Providers: SeriousBroker.it  This test is not yet approved or cleared by the [place] FDA and has been authorized for detection and/or  diagnosis of SARS-CoV-2 by FDA under an Emergency Use Authorization (EUA). This EUA will remain in effect (meaning this test can be used) for the duration of the COVID-19 declaration under Section 564(b)(1) of the Act, 21 U.S.C. section 360bbb-3(b)(1), unless the authorization is terminated or revoked.  Performed at [facility], [address] [place], [place] [address]     Labs: CBC: Recent Labs  Lab 04/30/22 1221 05/01/22 0650  WBC 8.0 9.8  NEUTROABS  --  8.5*  HGB 11.1* 10.2*  HCT 32.3* 29.6*  MCV 100.3* 101.0*  PLT 201 184   Basic Metabolic Panel: Recent Labs  Lab 04/30/22 1221 04/30/22 1442 05/01/22 0650  NA 135  --  136  K 3.1*  --  3.8  CL 103  --  111  CO2 23  --  20*  GLUCOSE 121*  --  95  BUN 31*  --  29*  CREATININE 1.52*   --  0.76  CALCIUM 7.9*  --  7.6*  MG  --  2.0  --    Liver Function Tests: Recent Labs  Lab 04/30/22 1221 05/01/22 0650  AST 64* 54*  ALT 41 32  ALKPHOS 82 66  BILITOT 0.6 0.6  PROT 7.1 5.7*  ALBUMIN 3.4* 2.6*   CBG: No results for input(s): "GLUCAP" in the last 168 hours.  Discharge time spent: greater than 30 minutes.  Signed: Lurene Shadow, MD [facility] 05/03/2022

## 2022-05-04 DIAGNOSIS — F0390 Unspecified dementia without behavioral disturbance: Secondary | ICD-10-CM | POA: Diagnosis not present

## 2022-05-04 DIAGNOSIS — J181 Lobar pneumonia, unspecified organism: Secondary | ICD-10-CM | POA: Diagnosis not present

## 2022-05-04 DIAGNOSIS — U071 COVID-19: Secondary | ICD-10-CM | POA: Diagnosis not present

## 2022-05-04 MED ORDER — AMOXICILLIN-POT CLAVULANATE 875-125 MG PO TABS: 1.0000 | ORAL_TABLET | Freq: Two times a day (BID) | ORAL | 0 refills | Status: DC

## 2022-05-04 MED ORDER — DOXYCYCLINE HYCLATE 100 MG PO CAPS: 100.0000 mg | ORAL_CAPSULE | Freq: Two times a day (BID) | ORAL | 0 refills | Status: DC

## 2022-05-04 NOTE — NC FL2 (Signed)
[place] MEDICAID FL2 LEVEL OF CARE SCREENING TOOL     IDENTIFICATION  Patient Name: Gabriella Rogers Birthdate: 01-16-1934 Sex: female Admission Date (Current Location): 04/30/2022  [facility] and [place] Number:  Chiropodist and Address:  [facility], [address] [place], [place] [address]      Provider Number: [id number]  Attending Physician Name and Address:  Lurene Shadow, MD  Relative Name and Phone Number:  Payton Mccallum: [phone]    Current Level of Care: Hospital Recommended Level of Care: Assisted Living Facility Prior Approval Number:    Date Approved/Denied:   PASRR Number:    Discharge Plan: Domiciliary (Rest home)    Current Diagnoses: Patient Active Problem List   Diagnosis Date Noted   Sinus bradycardia 05/02/2022   COVID-19 04/30/2022   Lobar pneumonia (HCC) 04/30/2022   Dementia without behavioral disturbance (HCC) 04/30/2022   Essential hypertension 04/30/2022   Ear drainage, bilateral    Sensorineural hearing loss (SNHL) of both ears    AKI (acute kidney injury) (HCC) 10/07/2021   Hypokalemia 10/07/2021   Macrocytic anemia 10/07/2021   Leukocytosis 10/07/2021   Generalized weakness 10/07/2021   CVA (cerebral vascular accident) (HCC) 10/07/2021   Urinary tract infection without hematuria 04/13/2021   Acute cystitis without hematuria 04/13/2021   Urethritis 04/13/2021   Painful urination 01/07/2021   Wax in ear 02/26/2020   Acute CVA (cerebrovascular accident) (HCC) 01/26/2020   Depression 11/26/2015   Dizziness 11/26/2015   Hyperlipidemia, unspecified 11/26/2015   Headache, migraine 11/26/2015   Osteoporosis, post-menopausal 11/26/2015   H/O total knee replacement 11/23/2015   Arthropathy, traumatic, knee 03/05/2015   Brain disorder 03/27/2014   Cervical dystonia 01/24/2012    Orientation RESPIRATION BLADDER Height & Weight     Self  Normal External catheter Weight: 112 lb 11.2 oz (51.1  kg) Height:  5\' 3"  (160 cm)  BEHAVIORAL SYMPTOMS/MOOD NEUROLOGICAL BOWEL NUTRITION STATUS   (NA)  (memory impairment, poor judgement, poor safety awareness) Continent Diet (Regular)  AMBULATORY STATUS COMMUNICATION OF NEEDS Skin   Extensive Assist Verbally Other (Comment) (dry/erythema/redness, perineum)                       Personal Care Assistance Level of Assistance  Bathing, Feeding, Dressing Bathing Assistance: Limited assistance Feeding assistance: Limited assistance Dressing Assistance: Limited assistance     Functional Limitations Info  Sight, Hearing, Speech Sight Info: Impaired Hearing Info: Impaired Speech Info: Adequate    SPECIAL CARE FACTORS FREQUENCY  PT (By licensed PT)     PT Frequency: 3x a week              Contractures Contractures Info: Not present    Additional Factors Info  Code Status Code Status Info: DNR             Current Medications (05/04/2022):  This is the current hospital active medication list Current Facility-Administered Medications  Medication Dose Route Frequency Provider Last Rate Last Admin   acetaminophen (TYLENOL) tablet 650 mg  650 mg Oral Q6H PRN Agbata, Tochukwu, MD       ascorbic acid (VITAMIN C) tablet 500 mg  500 mg Oral Daily Agbata, Tochukwu, MD   500 mg at 05/04/22 0915   aspirin EC tablet 81 mg  81 mg Oral Daily Agbata, Tochukwu, MD   81 mg at 05/04/22 0915   atorvastatin (LIPITOR) tablet 80 mg  80 mg Oral q1800 Agbata, Tochukwu, MD   80 mg at  05/03/22 1758   clopidogrel (PLAVIX) tablet 75 mg  75 mg Oral Daily Agbata, Tochukwu, MD   75 mg at 05/04/22 0915   dexamethasone (DECADRON) tablet 6 mg  6 mg Oral Daily Agbata, Tochukwu, MD   6 mg at 05/04/22 0915   doxycycline (VIBRA-TABS) tablet 100 mg  100 mg Oral Q12H Tressie Ellis, RPH   100 mg at 05/04/22 0916   enoxaparin (LOVENOX) injection 40 mg  40 mg Subcutaneous Q24H Ronnald Ramp, RPH   40 mg at 05/03/22 2106   famotidine (PEPCID) tablet 10 mg  10  mg Oral Daily Martyn Malay, RPH   10 mg at 05/04/22 0913   guaiFENesin-dextromethorphan (ROBITUSSIN DM) 100-10 MG/5ML syrup 10 mL  10 mL Oral Q4H PRN Agbata, Tochukwu, MD       multivitamin with minerals tablet   Oral Daily Agbata, Tochukwu, MD   1 tablet at 05/03/22 1802   ondansetron (ZOFRAN) tablet 4 mg  4 mg Oral Q6H PRN Agbata, Tochukwu, MD       Or   ondansetron (ZOFRAN) injection 4 mg  4 mg Intravenous Q6H PRN Agbata, Tochukwu, MD       PARoxetine (PAXIL) tablet 10 mg  10 mg Oral Daily Agbata, Tochukwu, MD   10 mg at 05/04/22 0916   phenol (CHLORASEPTIC) mouth spray 1 spray  1 spray Mouth/Throat PRN Lurene Shadow, MD   1 spray at 05/01/22 1706   polyethylene glycol (MIRALAX / GLYCOLAX) packet 17 g  17 g Oral Daily PRN Agbata, Tochukwu, MD       zinc sulfate capsule 220 mg  220 mg Oral Daily Agbata, Tochukwu, MD   220 mg at 05/04/22 0915     Discharge Medications: Please see discharge summary for a list of discharge medications.  Relevant Imaging Results:  Relevant Lab Results:   Additional Information SSN: [id number]  Tempie Hoist, LCSWA

## 2022-05-04 NOTE — Progress Notes (Signed)
   05/04/22 1055  Medical Necessity for Transport Certificate --- IF THIS TRANSPORT IS ROUND TRIP OR SCHEDULED AND REPEATED, A PHYSICIAN MUST COMPLETE THIS FORM  Transport from: Scientist, product/process development) [facility]  Transport to (Location) Other (Comment) ([facility])  Did the patient arrive from a Skilled Nursing Facility, Assisted Living Facility or Group Home? Yes  Care Facility Name Other (enter name of facility below)  Care Facility Name [facility]  Is this the closest appropriate facility? Yes  Date of Transport Service 05/04/22  Name of Transporting Agency [facility]  Round Trip Transport? No  Reason for Transport Discharge  Is this a hospice patient? No  Describe the Medical Condition COVID 19, lobar pneumonia, AKI, hypokalemia, depression, CVA, dementia, HTN, bradycardia  Q1 Are ALL the following "true"? 1. Patient unable to get up from bed without assistance  AND  2. Unable to ambulate  AND  3. Unable to sit in a chair, including wheelchair. Yes  Q2 Could the patient be transported safely by other means of transportation (I.E., wheelchair van)? No  Q3 Please check any of the following conditions that apply at the time of transport: Altered mental status;Risk of injury to self and/or others  Electronic Signature Tempie Hoist  Credentials DP  Date Signed 05/04/22

## 2022-05-04 NOTE — Care Management Important Message (Addendum)
Important Message  Patient Details  Name: Gabriella Rogers MRN: [id number] Date of Birth: March 13, 1934   Medicare Important Message Given:  Yes  Left a message for the patients spouse regarding the Important Message from Medicare. RN placed a copy in the patients belonging bag.  Late entry: Daughter, Gabriella Rogers called and I reviewed the Important Message from Medicare with her as patient was returning to Memory Care unit.  Olegario Messier A Draycen Leichter 05/04/2022, 11:52 AM

## 2022-05-04 NOTE — Progress Notes (Signed)
Report given to [facility] to Aya. Paper prescriptions requested from MD. Patient will be sent via EMS to [facility] when paperwork is complete.

## 2022-05-04 NOTE — TOC Progression Note (Addendum)
Transition of Care [facility]) - Progression Note    Patient Details  Name: NATHASHA FIORILLO MRN: [id number] Date of Birth: 02-07-1934  Transition [facility] Care [facility]) CM/SW Contact  Tempie Hoist, [place] Phone Number: 05/04/2022, 10:24 AM  Clinical Narrative:     Patient is lives at [facility]. Patient is current with [facility]. They can follow her at the ALF for HHPT. RN calling report to Aya at [facility]. TOC spoke to med tech at [facility] who provided a fax number for documents.  Documents faxed to [facility]. TOC calling EMS. RN calling report.  Expected Discharge Plan:  (Assisted [facility]) Barriers to Discharge: Continued Medical Work up  Expected Discharge Plan and Services Expected Discharge Plan:  (Assisted [place])       Living arrangements for the past 2 months: Assisted Living Facility [facility]) Expected Discharge Date: 05/04/22                                     Social Determinants of Health (SDOH) Interventions Housing Interventions: Intervention Not Indicated  Readmission Risk Interventions    05/01/2022    4:23 PM  Readmission Risk Prevention Plan  Transportation Screening Complete  PCP or Specialist Appt within 3-5 Days Complete  HRI or Home Care Consult Complete  Social Work Consult for Recovery Care Planning/Counseling Complete  Palliative Care Screening Not Applicable  Medication Review Oceanographer) Complete

## 2022-05-04 NOTE — TOC Transition Note (Signed)
Transition of Care [facility]) - CM/SW Discharge Note   Patient Details  Name: Gabriella Rogers MRN: [id number] Date of Birth: 1933-08-27  Transition of Care [facility]) CM/SW Contact:  Tempie Hoist, LCSWA Phone Number: 05/04/2022, 11:10 AM   Clinical Narrative:     TOC arranged discharge back to [facility] with [facility]. The patient will transport via [facility]. TOC left a message for the spouse regarding the transfer.  Final next level of care: Assisted Living Barriers to Discharge: Barriers Resolved   Patient Goals and CMS Choice    [facility]    Discharge Placement                Patient to be transferred to facility by: [facility] Name of family member notified: [name] [phone] Patient and family notified of of transfer: 05/04/22  Discharge Plan and Services                          HH Arranged: PT [facility] Agency: [facility] Date [facility] Agency Contacted: 05/04/22 Time HH Agency Contacted: 1000 Representative spoke with at [facility] Agency: Becky Sax  Social Determinants of Health (SDOH) Interventions Housing Interventions: Intervention Not Indicated   Readmission Risk Interventions    05/01/2022    4:23 PM  Readmission Risk Prevention Plan  Transportation Screening Complete  PCP or Specialist Appt within 3-5 Days Complete  HRI or Home Care Consult Complete  Social Work Consult for Recovery Care Planning/Counseling Complete  Palliative Care Screening Not Applicable  Medication Review Oceanographer) Complete

## 2022-05-04 NOTE — Discharge Summary (Signed)
Physician Discharge Summary   Patient: Gabriella Rogers MRN: [id number] DOB: 1934-02-27  Admit date:     04/30/2022  Discharge date: 05/04/22  Discharge Physician: Jennye Boroughs   PCP: Cletis Athens, MD   Recommendations at discharge:     Follow-up with PCP in 1 week   Discharge Diagnoses: Principal Problem:   COVID-19 Active Problems:   Lobar pneumonia ([place])   AKI (acute kidney injury) ([place])   Hypokalemia   Depression   CVA (cerebral vascular accident) ([place])   Dementia without behavioral disturbance ([place])   Essential hypertension   Sinus bradycardia  Resolved Problems:   * No resolved hospital problems. [facility] Course:   Gabriella Rogers is a 88 y.o. female with medical history significant for dementia, depression, dyslipidemia, presented from SNF because of positive COVID test, generalized weakness and shortness of breath.  She was hypoxic with oxygen saturation of 88%.   She was admitted to the hospital with COVID-19 infection with probable secondary bacterial pneumonia complicated by acute hypoxic respiratory failure.  She was noted to have AKI and hypokalemia.  She was treated with IV fluids, IV remdesivir and IV antibiotics.  She was placed on 2 L of oxygen via nasal cannula.  Hypoxia, hypokalemia and AKI have resolved.   She was also found to have sinus bradycardia on telemetry.  She takes Aricept for dementia.  Aricept has been discontinued because of bradycardia.  She is deemed stable for discharge to the assisted living facility today.        Consultants: None  Procedures performed: None  Disposition: Assisted living Diet recommendation:  Discharge Diet Orders (From admission, onward)     Start     Ordered   05/03/22 0000  Diet - low sodium heart healthy        05/03/22 1032           Cardiac diet DISCHARGE MEDICATION: Allergies as of 05/04/2022       Reactions   Actonel  [risedronate Sodium]    Piper    Pneumococcal Vaccine Other (See  Comments)   WEAKNESS, DISORIENTED, "ARM SORE, RED, HURTING"   Risedronate Other (See Comments)   REDNESS IN FACE   Alendronate Rash   Aleve [naproxen Sodium] Rash   Pravastatin Rash        Medication List     STOP taking these medications    amLODipine 5 MG tablet Commonly known as: NORVASC   clonazePAM 0.5 MG tablet Commonly known as: KLONOPIN   donepezil 10 MG tablet Commonly known as: ARICEPT       TAKE these medications    acetaminophen 325 MG tablet Commonly known as: TYLENOL Take 650 mg by mouth every 6 (six) hours as needed.   amoxicillin-clavulanate 875-125 MG tablet Commonly known as: AUGMENTIN Take 1 tablet by mouth 2 (two) times daily for 2 days.   artificial tears ointment Place 1 drop into both eyes as needed.   aspirin EC 81 MG tablet Take 1 tablet (81 mg total) by mouth daily. Swallow whole.   atorvastatin 80 MG tablet Commonly known as: LIPITOR Take 1 tablet (80 mg total) by mouth daily at 6 PM.   clopidogrel 75 MG tablet Commonly known as: PLAVIX Take 1 tablet (75 mg total) by mouth daily.   doxycycline 100 MG capsule Commonly known as: VIBRAMYCIN Take 1 capsule (100 mg total) by mouth 2 (two) times daily for 2 days.   famotidine 20 MG tablet Commonly known as: PEPCID  Take 20 mg by mouth 2 (two) times daily.   hydrochlorothiazide 12.5 MG tablet Commonly known as: HYDRODIURIL Take 12.5 mg by mouth daily.   ONE-A-DAY WOMENS PO Take 1 tablet by mouth daily.   PARoxetine 10 MG tablet Commonly known as: PAXIL Take 10 mg by mouth daily.   polyethylene glycol 17 g packet Commonly known as: MIRALAX / GLYCOLAX Take 17 g by mouth daily as needed for moderate constipation or severe constipation.        Discharge Exam: Filed Weights   04/30/22 1141 04/30/22 1148  Weight: 51.1 kg 51.1 kg   GEN: NAD SKIN: Warm and dry EYES: EOMI ENT: MMM CV: RRR PULM: CTA B ABD: soft, ND, NT, +BS CNS: AAO x 1, non focal EXT: No edema or  tenderness   Condition at discharge: good  The results of significant diagnostics from this hospitalization (including imaging, microbiology, ancillary and laboratory) are listed below for reference.   Imaging Studies: DG Chest Portable 1 View  Result Date: 04/30/2022 CLINICAL DATA:  Shortness of breath.  COVID positive. EXAM: PORTABLE CHEST 1 VIEW COMPARISON:  01/09/2022 FINDINGS: Patchy airspace disease is seen at the left lung base without substantial pleural effusion on the lateral film. Lungs are hyperinflated suggesting emphysema. Cardiopericardial silhouette is at upper limits of normal for size. Bones are diffusely demineralized. Telemetry leads overlie the chest. IMPRESSION: Patchy airspace disease at the left lung base compatible with pneumonia. Electronically Signed   By: Misty Stanley M.D.   On: 04/30/2022 11:58    Microbiology: Results for orders placed or performed during the hospital encounter of 04/30/22  Resp Panel by RT-PCR (Flu A&B, Covid) Anterior Nasal Swab     Status: Abnormal   Collection Time: 04/30/22 12:21 PM   Specimen: Anterior Nasal Swab  Result Value Ref Range Status   SARS Coronavirus 2 by RT PCR POSITIVE (A) NEGATIVE Final    Comment: (NOTE) SARS-CoV-2 target nucleic acids are DETECTED.  The SARS-CoV-2 RNA is generally detectable in upper respiratory specimens during the acute phase of infection. Positive results are indicative of the presence of the identified virus, but do not rule out bacterial infection or co-infection with other pathogens not detected by the test. Clinical correlation with patient history and other diagnostic information is necessary to determine patient infection status. The expected result is Negative.  Fact Sheet for Patients: EntrepreneurPulse.com.au  Fact Sheet for Healthcare Providers: IncredibleEmployment.be  This test is not yet approved or cleared by the [place] FDA and  has  been authorized for detection and/or diagnosis of SARS-CoV-2 by FDA under an Emergency Use Authorization (EUA).  This EUA will remain in effect (meaning this test can be used) for the duration of  the COVID-19 declaration under Section 564(b)(1) of the A ct, 21 U.S.C. section 360bbb-3(b)(1), unless the authorization is terminated or revoked sooner.     Influenza A by PCR NEGATIVE NEGATIVE Final   Influenza B by PCR NEGATIVE NEGATIVE Final    Comment: (NOTE) The Xpert Xpress SARS-CoV-2/FLU/RSV plus assay is intended as an aid in the diagnosis of influenza from Nasopharyngeal swab specimens and should not be used as a sole basis for treatment. Nasal washings and aspirates are unacceptable for Xpert Xpress SARS-CoV-2/FLU/RSV testing.  Fact Sheet for Patients: EntrepreneurPulse.com.au  Fact Sheet for Healthcare Providers: IncredibleEmployment.be  This test is not yet approved or cleared by the [place] FDA and has been authorized for detection and/or diagnosis of SARS-CoV-2 by FDA under an Emergency Use Authorization (  EUA). This EUA will remain in effect (meaning this test can be used) for the duration of the COVID-19 declaration under Section 564(b)(1) of the Act, 21 U.S.C. section 360bbb-3(b)(1), unless the authorization is terminated or revoked.  Performed at [facility], [address] [place], [place] [address]     Labs: CBC: Recent Labs  Lab 04/30/22 1221 05/01/22 0650  WBC 8.0 9.8  NEUTROABS  --  8.5*  HGB 11.1* 10.2*  HCT 32.3* 29.6*  MCV 100.3* 101.0*  PLT 201 184   Basic Metabolic Panel: Recent Labs  Lab 04/30/22 1221 04/30/22 1442 05/01/22 0650  NA 135  --  136  K 3.1*  --  3.8  CL 103  --  111  CO2 23  --  20*  GLUCOSE 121*  --  95  BUN 31*  --  29*  CREATININE 1.52*  --  0.76  CALCIUM 7.9*  --  7.6*  MG  --  2.0  --    Liver Function Tests: Recent Labs  Lab 04/30/22 1221 05/01/22 0650   AST 64* 54*  ALT 41 32  ALKPHOS 82 66  BILITOT 0.6 0.6  PROT 7.1 5.7*  ALBUMIN 3.4* 2.6*   CBG: No results for input(s): "GLUCAP" in the last 168 hours.  Discharge time spent: less than 30 minutes.  Signed: Lurene Shadow, MD [facility] 05/04/2022

## 2022-05-05 ENCOUNTER — Encounter: Payer: Self-pay | Admitting: Internal Medicine

## 2022-05-05 ENCOUNTER — Emergency Department: Payer: Medicare HMO

## 2022-05-05 ENCOUNTER — Inpatient Hospital Stay
Admission: EM | Admit: 2022-05-05 | Discharge: 2022-05-10 | DRG: 536 | Disposition: A | Discharge status: Skilled Nursing Facility | Payer: Medicare HMO | Attending: Osteopathic Medicine | Admitting: Osteopathic Medicine

## 2022-05-05 ENCOUNTER — Other Ambulatory Visit: Payer: Self-pay

## 2022-05-05 DIAGNOSIS — F32A Depression, unspecified: Secondary | ICD-10-CM | POA: Diagnosis present

## 2022-05-05 DIAGNOSIS — Z7982 Long term (current) use of aspirin: Secondary | ICD-10-CM

## 2022-05-05 DIAGNOSIS — R001 Bradycardia, unspecified: Secondary | ICD-10-CM | POA: Diagnosis present

## 2022-05-05 DIAGNOSIS — S32591A Other specified fracture of right pubis, initial encounter for closed fracture: Secondary | ICD-10-CM | POA: Diagnosis present

## 2022-05-05 DIAGNOSIS — S32491A Other specified fracture of right acetabulum, initial encounter for closed fracture: Secondary | ICD-10-CM | POA: Diagnosis present

## 2022-05-05 DIAGNOSIS — I639 Cerebral infarction, unspecified: Secondary | ICD-10-CM | POA: Diagnosis not present

## 2022-05-05 DIAGNOSIS — T502X5A Adverse effect of carbonic-anhydrase inhibitors, benzothiadiazides and other diuretics, initial encounter: Secondary | ICD-10-CM | POA: Diagnosis present

## 2022-05-05 DIAGNOSIS — M810 Age-related osteoporosis without current pathological fracture: Secondary | ICD-10-CM | POA: Diagnosis present

## 2022-05-05 DIAGNOSIS — W010XXA Fall on same level from slipping, tripping and stumbling without subsequent striking against object, initial encounter: Secondary | ICD-10-CM | POA: Diagnosis present

## 2022-05-05 DIAGNOSIS — Z6820 Body mass index (BMI) 20.0-20.9, adult: Secondary | ICD-10-CM

## 2022-05-05 DIAGNOSIS — N179 Acute kidney failure, unspecified: Secondary | ICD-10-CM | POA: Diagnosis present

## 2022-05-05 DIAGNOSIS — Z823 Family history of stroke: Secondary | ICD-10-CM | POA: Diagnosis not present

## 2022-05-05 DIAGNOSIS — Z888 Allergy status to other drugs, medicaments and biological substances status: Secondary | ICD-10-CM | POA: Diagnosis not present

## 2022-05-05 DIAGNOSIS — Z887 Allergy status to serum and vaccine status: Secondary | ICD-10-CM

## 2022-05-05 DIAGNOSIS — E876 Hypokalemia: Secondary | ICD-10-CM | POA: Diagnosis present

## 2022-05-05 DIAGNOSIS — S32401A Unspecified fracture of right acetabulum, initial encounter for closed fracture: Secondary | ICD-10-CM | POA: Diagnosis present

## 2022-05-05 DIAGNOSIS — E7800 Pure hypercholesterolemia, unspecified: Secondary | ICD-10-CM | POA: Diagnosis present

## 2022-05-05 DIAGNOSIS — F0390 Unspecified dementia without behavioral disturbance: Secondary | ICD-10-CM | POA: Diagnosis present

## 2022-05-05 DIAGNOSIS — S329XXA Fracture of unspecified parts of lumbosacral spine and pelvis, initial encounter for closed fracture: Secondary | ICD-10-CM

## 2022-05-05 DIAGNOSIS — D649 Anemia, unspecified: Secondary | ICD-10-CM | POA: Diagnosis present

## 2022-05-05 DIAGNOSIS — Z993 Dependence on wheelchair: Secondary | ICD-10-CM

## 2022-05-05 DIAGNOSIS — Y92129 Unspecified place in nursing home as the place of occurrence of the external cause: Secondary | ICD-10-CM | POA: Diagnosis not present

## 2022-05-05 DIAGNOSIS — U071 COVID-19: Secondary | ICD-10-CM | POA: Diagnosis not present

## 2022-05-05 DIAGNOSIS — Z79899 Other long term (current) drug therapy: Secondary | ICD-10-CM

## 2022-05-05 DIAGNOSIS — E440 Moderate protein-calorie malnutrition: Secondary | ICD-10-CM | POA: Diagnosis present

## 2022-05-05 DIAGNOSIS — Z8616 Personal history of COVID-19: Secondary | ICD-10-CM | POA: Diagnosis not present

## 2022-05-05 DIAGNOSIS — R296 Repeated falls: Secondary | ICD-10-CM | POA: Diagnosis not present

## 2022-05-05 DIAGNOSIS — Z8673 Personal history of transient ischemic attack (TIA), and cerebral infarction without residual deficits: Secondary | ICD-10-CM

## 2022-05-05 DIAGNOSIS — Z66 Do not resuscitate: Secondary | ICD-10-CM | POA: Diagnosis present

## 2022-05-05 DIAGNOSIS — Z7902 Long term (current) use of antithrombotics/antiplatelets: Secondary | ICD-10-CM

## 2022-05-05 DIAGNOSIS — I10 Essential (primary) hypertension: Secondary | ICD-10-CM | POA: Diagnosis present

## 2022-05-05 DIAGNOSIS — W19XXXA Unspecified fall, initial encounter: Principal | ICD-10-CM

## 2022-05-05 DIAGNOSIS — S32481A Displaced dome fracture of right acetabulum, initial encounter for closed fracture: Secondary | ICD-10-CM | POA: Diagnosis not present

## 2022-05-05 LAB — COMPREHENSIVE METABOLIC PANEL
ALT: 57 U/L — ABNORMAL HIGH (ref 0–44)
AST: 89 U/L — ABNORMAL HIGH (ref 15–41)
Albumin: 3.5 g/dL (ref 3.5–5.0)
Alkaline Phosphatase: 73 U/L (ref 38–126)
Anion gap: 10 (ref 5–15)
BUN: 36 mg/dL — ABNORMAL HIGH (ref 8–23)
CO2: 21 mmol/L — ABNORMAL LOW (ref 22–32)
Calcium: 8.6 mg/dL — ABNORMAL LOW (ref 8.9–10.3)
Chloride: 107 mmol/L (ref 98–111)
Creatinine, Ser: 1.13 mg/dL — ABNORMAL HIGH (ref 0.44–1.00)
GFR, Estimated: 47 mL/min — ABNORMAL LOW (ref >60)
Glucose, Bld: 109 mg/dL — ABNORMAL HIGH (ref 70–99)
Potassium: 3.5 mmol/L (ref 3.5–5.1)
Sodium: 138 mmol/L (ref 135–145)
Total Bilirubin: 1.2 mg/dL (ref 0.3–1.2)
Total Protein: 7 g/dL (ref 6.5–8.1)

## 2022-05-05 LAB — CK: Total CK: 996 U/L — ABNORMAL HIGH (ref 38–234)

## 2022-05-05 LAB — CBC WITH DIFFERENTIAL/PLATELET
Abs Immature Granulocytes: 0.1 10*3/uL — ABNORMAL HIGH (ref 0.00–0.07)
Basophils Absolute: 0 10*3/uL (ref 0.0–0.1)
Basophils Relative: 0 %
Eosinophils Absolute: 0 10*3/uL (ref 0.0–0.5)
Eosinophils Relative: 0 %
HCT: 35.6 % — ABNORMAL LOW (ref 36.0–46.0)
Hemoglobin: 12.3 g/dL (ref 12.0–15.0)
Immature Granulocytes: 1 %
Lymphocytes Relative: 10 %
Lymphs Abs: 1.4 10*3/uL (ref 0.7–4.0)
MCH: 34.5 pg — ABNORMAL HIGH (ref 26.0–34.0)
MCHC: 34.6 g/dL (ref 30.0–36.0)
MCV: 99.7 fL (ref 80.0–100.0)
Monocytes Absolute: 0.9 10*3/uL (ref 0.1–1.0)
Monocytes Relative: 7 %
Neutro Abs: 11.2 10*3/uL — ABNORMAL HIGH (ref 1.7–7.7)
Neutrophils Relative %: 82 %
Platelets: 270 10*3/uL (ref 150–400)
RBC: 3.57 MIL/uL — ABNORMAL LOW (ref 3.87–5.11)
RDW: 13.6 % (ref 11.5–15.5)
WBC: 13.6 10*3/uL — ABNORMAL HIGH (ref 4.0–10.5)
nRBC: 0 % (ref 0.0–0.2)

## 2022-05-05 LAB — TROPONIN I (HIGH SENSITIVITY): Troponin I (High Sensitivity): 10 ng/L (ref <18)

## 2022-05-05 LAB — TSH: TSH: 6.266 u[IU]/mL — ABNORMAL HIGH (ref 0.350–4.500)

## 2022-05-05 MED ORDER — ONDANSETRON HCL 4 MG PO TABS: 4.0000 mg | ORAL_TABLET | Freq: Four times a day (QID) | ORAL | Status: DC | PRN

## 2022-05-05 MED ORDER — PAROXETINE HCL 10 MG PO TABS
10.0000 mg | ORAL_TABLET | Freq: Every day | ORAL | Status: DC
  Administered 2022-05-06 – 2022-05-10 (×5): 10 mg via ORAL
  Filled 2022-05-05 (×6): qty 1

## 2022-05-05 MED ORDER — MORPHINE SULFATE (PF) 2 MG/ML IV SOLN
2.0000 mg | Freq: Once | INTRAVENOUS | Status: AC
  Administered 2022-05-05: 2 mg via INTRAVENOUS
  Filled 2022-05-05: qty 1

## 2022-05-05 MED ORDER — POLYETHYLENE GLYCOL 3350 17 G PO PACK: 17.0000 g | PACK | Freq: Every day | ORAL | Status: DC | PRN

## 2022-05-05 MED ORDER — SODIUM CHLORIDE 0.9 % IV SOLN: INTRAVENOUS | Status: AC

## 2022-05-05 MED ORDER — ACETAMINOPHEN 500 MG PO TABS
1000.0000 mg | ORAL_TABLET | Freq: Three times a day (TID) | ORAL | Status: DC
  Administered 2022-05-05 – 2022-05-10 (×14): 1000 mg via ORAL
  Filled 2022-05-05 (×14): qty 2

## 2022-05-05 MED ORDER — FAMOTIDINE 20 MG PO TABS
20.0000 mg | ORAL_TABLET | Freq: Every day | ORAL | Status: DC
  Administered 2022-05-05 – 2022-05-10 (×6): 20 mg via ORAL
  Filled 2022-05-05 (×6): qty 1

## 2022-05-05 MED ORDER — DOXYCYCLINE HYCLATE 100 MG PO TABS
100.0000 mg | ORAL_TABLET | Freq: Two times a day (BID) | ORAL | Status: AC
  Administered 2022-05-05 – 2022-05-06 (×4): 100 mg via ORAL
  Filled 2022-05-05 (×4): qty 1

## 2022-05-05 MED ORDER — ARTIFICIAL TEARS OPHTHALMIC OINT: 1.0000 | TOPICAL_OINTMENT | OPHTHALMIC | Status: DC | PRN

## 2022-05-05 MED ORDER — SODIUM CHLORIDE 0.9 % IV BOLUS
500.0000 mL | Freq: Once | INTRAVENOUS | Status: AC
  Administered 2022-05-05: 500 mL via INTRAVENOUS

## 2022-05-05 MED ORDER — ASPIRIN 81 MG PO TBEC
81.0000 mg | DELAYED_RELEASE_TABLET | Freq: Every day | ORAL | Status: DC
  Administered 2022-05-05 – 2022-05-10 (×6): 81 mg via ORAL
  Filled 2022-05-05 (×6): qty 1

## 2022-05-05 MED ORDER — TRAMADOL HCL 50 MG PO TABS
50.0000 mg | ORAL_TABLET | Freq: Four times a day (QID) | ORAL | Status: DC | PRN
  Administered 2022-05-08 – 2022-05-09 (×2): 50 mg via ORAL
  Filled 2022-05-05 (×2): qty 1

## 2022-05-05 MED ORDER — ONDANSETRON HCL 4 MG/2ML IJ SOLN: 4.0000 mg | Freq: Four times a day (QID) | INTRAMUSCULAR | Status: DC | PRN

## 2022-05-05 MED ORDER — ENSURE ENLIVE PO LIQD
237.0000 mL | Freq: Two times a day (BID) | ORAL | Status: DC
  Administered 2022-05-05 – 2022-05-10 (×9): 237 mL via ORAL

## 2022-05-05 MED ORDER — ATORVASTATIN CALCIUM 20 MG PO TABS
80.0000 mg | ORAL_TABLET | Freq: Every day | ORAL | Status: DC
  Administered 2022-05-05 – 2022-05-09 (×5): 80 mg via ORAL
  Filled 2022-05-05 (×5): qty 4

## 2022-05-05 MED ORDER — AMOXICILLIN-POT CLAVULANATE 500-125 MG PO TABS
1.0000 | ORAL_TABLET | Freq: Two times a day (BID) | ORAL | Status: AC
  Administered 2022-05-05 – 2022-05-06 (×3): 1 via ORAL
  Filled 2022-05-05 (×4): qty 1

## 2022-05-05 MED ORDER — ADULT MULTIVITAMIN W/MINERALS CH: 1.0000 | ORAL_TABLET | Freq: Every day | ORAL | Status: DC

## 2022-05-05 MED ORDER — CLOPIDOGREL BISULFATE 75 MG PO TABS
75.0000 mg | ORAL_TABLET | Freq: Every day | ORAL | Status: DC
  Administered 2022-05-05 – 2022-05-10 (×6): 75 mg via ORAL
  Filled 2022-05-05 (×6): qty 1

## 2022-05-05 MED ORDER — ONDANSETRON HCL 4 MG/2ML IJ SOLN
4.0000 mg | Freq: Once | INTRAMUSCULAR | Status: AC
  Administered 2022-05-05: 4 mg via INTRAVENOUS
  Filled 2022-05-05: qty 2

## 2022-05-05 NOTE — ED Notes (Signed)
Pt A&Ox2, resting, NAD, calm, interactive. BP cuff switched down to small adult and repositioned. BP still low/soft. Denies sob or nausea.

## 2022-05-05 NOTE — Assessment & Plan Note (Signed)
Continue Paxil.

## 2022-05-05 NOTE — Assessment & Plan Note (Addendum)
Status post fall with closed fracture of right inferior pubic ramus Pain control with scheduled acetaminophen and as needed tramadol per Ortho recommendation Fall precautions PT evaluation

## 2022-05-05 NOTE — Assessment & Plan Note (Addendum)
Status post fall with a closed right acetabular fracture Nonsurgical management Pain control with scheduled acetaminophen and as needed tramadol Fall precautions Orthopedic surgery consult

## 2022-05-05 NOTE — ED Notes (Signed)
Fluids switched from gravity to an IV pump so that at 500cc mark will switch from bolus rate to 50cc/hr.

## 2022-05-05 NOTE — ED Notes (Signed)
MD and primary RN Gabe notified unable to get blood at this time.

## 2022-05-05 NOTE — Plan of Care (Signed)
Pt admitted to unit. Assessed as documented. Telemetry placed and bed alarm set. Pt denies pain at this time.

## 2022-05-05 NOTE — Progress Notes (Signed)
Discussed with Dr. Audelia Acton who commended to continue aspirin and Plavix and to monitor H&H closely

## 2022-05-05 NOTE — Assessment & Plan Note (Signed)
Most likely related to diuretic therapy Patient has a baseline serum creatinine of 0.7 and today on admission it is 1.13 She has dry mucous membranes and relative hypotension Judicious IV fluid resuscitation Hold HCTZ Repeat renal parameter is in a.m.

## 2022-05-05 NOTE — Assessment & Plan Note (Addendum)
Patient was brought into the ER for evaluation of unwitnessed fall with right-sided hip pain and imaging shows a right superior acetabulum fracture with mild protrusio deformity and non displaced right inferior pubic ramus fracture. We will place patient on fall precautions Orthopedic surgery consult

## 2022-05-05 NOTE — ED Provider Notes (Addendum)
[facility] Provider Note    Event Date/Time   First MD Initiated Contact with Patient 05/05/22 [phone]     (approximate)   History   Fall (BIBA for fall. Per EMS patient was found by [facility] staff on the bathroom floor. Unsure of LOC. Complains of left ear discomfort, posterior head and right hip pain. Patient is Covid positive.)   HPI  Level V caveat: Limited by dementia  BEANCA BEQUETTE is a 88 y.o. female brought to the ED via EMS from [facility] : [facility] assisted living facility status post unwitnessed fall.  Patient was found on the bathroom floor.  Complains of left ear discomfort, posterior head and initially told EMS right hip pain but now states left hip pain.  Recent hospitalization for COVID-pneumonia.  On aspirin, Plavix and currently taking doxycycline.  Rest of history is limited secondary to patient's dementia.     Past Medical History   Past Medical History:  Diagnosis Date   Brain disorder 03/27/2014   Cervical dystonia 01/24/2012   Clinical depression 11/26/2015   Dizziness 11/26/2015   Overview:  Chronic, felt secondary to inner ear per ENT evaluation approximately 1996.    H/O total knee replacement 11/23/2015   Headache, migraine 11/26/2015   HLD (hyperlipidemia) 11/26/2015   Hypercholesteremia    Osteoporosis, post-menopausal 11/26/2015     Active Problem List   Patient Active Problem List   Diagnosis Date Noted   Sinus bradycardia 05/02/2022   COVID-19 04/30/2022   Lobar pneumonia ([place]) 04/30/2022   Dementia without behavioral disturbance ([place]) 04/30/2022   Essential hypertension 04/30/2022   Ear drainage, bilateral    Sensorineural hearing loss (SNHL) of both ears    AKI (acute kidney injury) ([place]) 10/07/2021   Hypokalemia 10/07/2021   Macrocytic anemia 10/07/2021   Leukocytosis 10/07/2021   Generalized weakness 10/07/2021   CVA (cerebral vascular accident) ([place]) 10/07/2021   Urinary tract infection without  hematuria 04/13/2021   Acute cystitis without hematuria 04/13/2021   Urethritis 04/13/2021   Painful urination 01/07/2021   Wax in ear 02/26/2020   Acute CVA (cerebrovascular accident) ([place]) 01/26/2020   Depression 11/26/2015   Dizziness 11/26/2015   Hyperlipidemia, unspecified 11/26/2015   Headache, migraine 11/26/2015   Osteoporosis, post-menopausal 11/26/2015   H/O total knee replacement 11/23/2015   Arthropathy, traumatic, knee 03/05/2015   Brain disorder 03/27/2014   Cervical dystonia 01/24/2012     Past Surgical History   Past Surgical History:  Procedure Laterality Date   ABDOMINAL HYSTERECTOMY     ANKLE SURGERY Left    KNEE SURGERY Right      Home Medications   Prior to Admission medications   Medication Sig Start Date End Date Taking? Authorizing Provider  acetaminophen (TYLENOL) 325 MG tablet Take 650 mg by mouth every 6 (six) hours as needed.    [provider]  amoxicillin-clavulanate (AUGMENTIN) 875-125 MG tablet Take 1 tablet by mouth 2 (two) times daily for 2 days. 05/04/22 05/06/22  Jennye Boroughs, MD  aspirin EC 81 MG EC tablet Take 1 tablet (81 mg total) by mouth daily. Swallow whole. 10/14/21   Amin, Jeanella Flattery, MD  atorvastatin (LIPITOR) 80 MG tablet Take 1 tablet (80 mg total) by mouth daily at 6 PM. 08/25/20   Cletis Athens, MD  clopidogrel (PLAVIX) 75 MG tablet Take 1 tablet (75 mg total) by mouth daily. 09/29/21   Cletis Athens, MD  doxycycline (VIBRAMYCIN) 100 MG capsule Take 1 capsule (100 mg total) by  mouth 2 (two) times daily for 2 days. 05/04/22 05/06/22  Jennye Boroughs, MD  famotidine (PEPCID) 20 MG tablet Take 20 mg by mouth 2 (two) times daily. 11/02/17   [provider]  hydrochlorothiazide (HYDRODIURIL) 12.5 MG tablet Take 12.5 mg by mouth daily. 04/14/22   [provider]  Multiple Vitamins-Minerals (ONE-A-DAY WOMENS PO) Take 1 tablet by mouth daily.    [provider]  PARoxetine (PAXIL) 10 MG tablet Take 10  mg by mouth daily. 07/29/14   [provider]  polyethylene glycol (MIRALAX / GLYCOLAX) 17 g packet Take 17 g by mouth daily as needed for moderate constipation or severe constipation. 10/13/21   Amin, Jeanella Flattery, MD  White Petrolatum-Mineral Oil (ARTIFICIAL TEARS) ointment Place 1 drop into both eyes as needed.    [provider]     Allergies  Actonel  [risedronate sodium], [place], Pneumococcal vaccine, Risedronate, Alendronate, Aleve [naproxen sodium], and Pravastatin   Family History   Family History  Problem Relation Age of Onset   Stroke Maternal Grandfather    Stroke Paternal Grandfather    Kidney cancer Neg Hx    Hematuria Neg Hx      Physical Exam  Triage Vital Signs: ED Triage Vitals  Enc Vitals Group     BP      Pulse      Resp      Temp      Temp src      SpO2      Weight      Height      Head Circumference      Peak Flow      Pain Score      Pain Loc      Pain Edu?      Excl. in [place]?     Updated Vital Signs: BP (!) 109/50 (BP Location: Right Arm)   Pulse (!) 56   Temp 98.3 F (36.8 C) (Rectal)   Resp 15   Ht 5\' 3"  (1.6 m)   Wt 51.9 kg   SpO2 97%   BMI 20.28 kg/m    General: Awake, no distress.  CV:  Bradycardic.  Good peripheral perfusion.  Resp:  Normal effort.  CTA B. Abd:  Nontender to light or deep palpation.  No distention.  Other:  Head is atraumatic.  Bilateral TMs dull; mild fluid behind left TM without rupture.  No external injury to either ear.  Nose is atraumatic.  No dental malocclusion.  No cervical spine tenderness to palpation.  Pelvis is stable.  No shortening or external rotation of either leg.  ED Results / Procedures / Treatments  Labs (all labs ordered are listed, but only abnormal results are displayed) Labs Reviewed  CBC WITH DIFFERENTIAL/PLATELET - Abnormal; Notable for the following components:      Result Value   WBC 13.6 (*)    RBC 3.57 (*)    HCT 35.6 (*)    MCH 34.5 (*)    Neutro Abs 11.2 (*)     Abs Immature Granulocytes 0.10 (*)    All other components within normal limits  COMPREHENSIVE METABOLIC PANEL - Abnormal; Notable for the following components:   CO2 21 (*)    Glucose, Bld 109 (*)    BUN 36 (*)    Creatinine, Ser 1.13 (*)    Calcium 8.6 (*)    AST 89 (*)    ALT 57 (*)    GFR, Estimated 47 (*)    All other  components within normal limits  URINALYSIS, ROUTINE W REFLEX MICROSCOPIC  TROPONIN I (HIGH SENSITIVITY)  TROPONIN I (HIGH SENSITIVITY)     EKG  ED ECG REPORT I, Valree Feild J, the attending physician, personally viewed and interpreted this ECG.   Date: 05/05/2022  EKG Time: 0428  Rate: 48  Rhythm: sinus bradycardia  Axis: Normal  Intervals:none  ST&T Change: Nonspecific    RADIOLOGY I have independently visualized and interpreted patient's CT head, chest x-ray and pelvis x-ray as well as noted the radiology interpretation:  CT head: No ICH  CT cervical spine: No acute osseous injuries  Chest x-ray: Improved aeration at left base from prior  Pelvis x-ray: New irregularity of right acetabulum and obturator ring, recommend CT  CT right hip: Acute branching fracture through right superior acetabulum with mild protrusio deformity, nondisplaced right inferior pubic ramus fracture  Official radiology report(s): CT Hip Right Wo Contrast  Result Date: 05/05/2022 CLINICAL DATA:  Hip fracture suspected EXAM: CT OF THE RIGHT HIP WITHOUT CONTRAST TECHNIQUE: Multidetector CT imaging of the right hip was performed according to the standard protocol. Multiplanar CT image reconstructions were also generated. RADIATION DOSE REDUCTION: This exam was performed according to the departmental dose-optimization program which includes automated exposure control, adjustment of the mA and/or kV according to patient size and/or use of iterative reconstruction technique. COMPARISON:  Pelvis radiography from earlier today FINDINGS: Confirmed branching right acetabular  fracture with mild protrusio deformity on coronal reformats. There is a mildly displaced fracture through the inferior pubic ramus on the right. Pronounced osteopenia. Expected hemorrhage in the pelvis adjacent to the fracture. Simple right adnexal cyst measuring 2.7 cm. No follow-up imaging recommended. Note: This recommendation does not apply to premenarchal patients and to those with increased risk (genetic, family history, elevated tumor markers or other high-risk factors) of ovarian cancer. Reference: JACR 2020 Feb; 17(2):248-254 IMPRESSION: 1. Acute, branching fracture through the right superior acetabulum with mild protrusio deformity. 2. Nondisplaced right inferior pubic ramus fracture. 3. Pronounced osteopenia. 4. Expected soft tissue hemorrhage in the deep pelvis. Electronically Signed   By: Tiburcio Pea M.D.   On: 05/05/2022 06:31   DG Chest Port 1 View  Result Date: 05/05/2022 CLINICAL DATA:  Fall with cough.  COVID. EXAM: PORTABLE CHEST 1 VIEW COMPARISON:  04/30/2022 FINDINGS: Resolved left lower lobe pneumonia. Right apical opacity which is pleural based scarring and calcification by cervical spine CT. Normal heart size and mediastinal contours. No acute finding. IMPRESSION: Significantly improved aeration at the left base. No new or acute finding. Electronically Signed   By: Tiburcio Pea M.D.   On: 05/05/2022 05:05   DG Pelvis Portable  Result Date: 05/05/2022 CLINICAL DATA:  Fall.  Unsure which hip is painful EXAM: PORTABLE PELVIS 1-2 VIEWS COMPARISON:  01/09/2022 FINDINGS: Subjective osteopenia. Distorted iliopectineal line on the right, not seen previously, without discrete fracture line. Irregularity at the inferior right pubic ramus which also appears new and consistent with fracture. Remote fracture of the right lateral iliac wing. Both hips appear located in the proximal femurs appear intact. IMPRESSION: New irregularity of the right acetabulum and obturator ring, recommend CT.  Electronically Signed   By: Tiburcio Pea M.D.   On: 05/05/2022 05:03   CT Head Wo Contrast  Result Date: 05/05/2022 CLINICAL DATA:  Neck trauma.  Weakness and fall.  COVID positive EXAM: CT HEAD WITHOUT CONTRAST CT CERVICAL SPINE WITHOUT CONTRAST TECHNIQUE: Multidetector CT imaging of the head and cervical spine was performed following the standard  protocol without intravenous contrast. Multiplanar CT image reconstructions of the cervical spine were also generated. RADIATION DOSE REDUCTION: This exam was performed according to the departmental dose-optimization program which includes automated exposure control, adjustment of the mA and/or kV according to patient size and/or use of iterative reconstruction technique. COMPARISON:  07/21/2016 FINDINGS: CT HEAD FINDINGS Brain: No evidence of acute infarction, hemorrhage, hydrocephalus, extra-axial collection or mass lesion/mass effect. Moderate brain atrophy and chronic small vessel ischemia. A chronic lacunar infarct has occurred in the right thalamus. Vascular: No hyperdense vessel or unexpected calcification. Skull: Normal. Negative for fracture or focal lesion. Sinuses/Orbits: Partial right mastoid opacification with negative nasopharynx. CT CERVICAL SPINE FINDINGS Alignment: No traumatic malalignment Skull base and vertebrae: No acute fracture.  Subjective osteopenia Soft tissues and spinal canal: No prevertebral fluid or swelling. No visible canal hematoma. Disc levels: Advanced and generalized degeneration without visible cord impingement. C3-4 facet ankylosis on the right where there is bulky spurring encroaching on the foramen. Upper chest: Pleural based scarring with calcification at the right apex. IMPRESSION: 1. No evidence of acute intracranial or cervical spine injury. 2. Right mastoid opacification. 3. Degenerative and senescent changes described above. Electronically Signed   By: Jorje Guild M.D.   On: 05/05/2022 04:51   CT Cervical Spine  Wo Contrast  Result Date: 05/05/2022 CLINICAL DATA:  Neck trauma.  Weakness and fall.  COVID positive EXAM: CT HEAD WITHOUT CONTRAST CT CERVICAL SPINE WITHOUT CONTRAST TECHNIQUE: Multidetector CT imaging of the head and cervical spine was performed following the standard protocol without intravenous contrast. Multiplanar CT image reconstructions of the cervical spine were also generated. RADIATION DOSE REDUCTION: This exam was performed according to the departmental dose-optimization program which includes automated exposure control, adjustment of the mA and/or kV according to patient size and/or use of iterative reconstruction technique. COMPARISON:  07/21/2016 FINDINGS: CT HEAD FINDINGS Brain: No evidence of acute infarction, hemorrhage, hydrocephalus, extra-axial collection or mass lesion/mass effect. Moderate brain atrophy and chronic small vessel ischemia. A chronic lacunar infarct has occurred in the right thalamus. Vascular: No hyperdense vessel or unexpected calcification. Skull: Normal. Negative for fracture or focal lesion. Sinuses/Orbits: Partial right mastoid opacification with negative nasopharynx. CT CERVICAL SPINE FINDINGS Alignment: No traumatic malalignment Skull base and vertebrae: No acute fracture.  Subjective osteopenia Soft tissues and spinal canal: No prevertebral fluid or swelling. No visible canal hematoma. Disc levels: Advanced and generalized degeneration without visible cord impingement. C3-4 facet ankylosis on the right where there is bulky spurring encroaching on the foramen. Upper chest: Pleural based scarring with calcification at the right apex. IMPRESSION: 1. No evidence of acute intracranial or cervical spine injury. 2. Right mastoid opacification. 3. Degenerative and senescent changes described above. Electronically Signed   By: Jorje Guild M.D.   On: 05/05/2022 04:51     PROCEDURES:  Critical Care performed: No  .1-3 Lead EKG Interpretation  Performed by: Paulette Blanch, MD Authorized by: Paulette Blanch, MD     Interpretation: abnormal     ECG rate:  50   ECG rate assessment: bradycardic     Rhythm: sinus bradycardia     Ectopy: none     Conduction: normal   Comments:     Patient placed on cardiac monitor to evaluate for arrhythmias    MEDICATIONS ORDERED IN ED: Medications  acetaminophen (TYLENOL) tablet 1,000 mg (has no administration in time range)  traMADol (ULTRAM) tablet 50 mg (has no administration in time range)  sodium chloride 0.9 %  bolus 500 mL (0 mLs Intravenous Stopped 05/05/22 0636)  morphine (PF) 2 MG/ML injection 2 mg (2 mg Intravenous Given 05/05/22 0637)  ondansetron (ZOFRAN) injection 4 mg (4 mg Intravenous Given 05/05/22 0637)     IMPRESSION / MDM / ASSESSMENT AND PLAN / ED COURSE  I reviewed the triage vital signs and the nursing notes.                             88 year old female brought for unwitnessed fall with head pain and hip pain, unsure which hip.  Differential diagnosis includes but is not limited to [place], hip fracture, dislocation, musculoskeletal contusion.  I have personally reviewed patient's records and note her recent hospitalization 11/10 - 05/04/2022 for acute hypoxemic respiratory failure secondary to COVID-19 and H CAP.  Patient's presentation is most consistent with acute presentation with potential threat to life or bodily function.  The patient is on the cardiac monitor to evaluate for evidence of arrhythmia and/or significant heart rate changes.  We will obtain basic lab work, UA.  Obtain CT head, chest x-ray given recent COVID-pneumonia, pelvis x-ray.  Will reassess.  Clinical Course as of 05/05/22 [id number]  Wed May 05, 2022  0530 CT head and cervical spine negative for acute traumatic injury; chest x-ray improved from prior.  Pelvis x-ray concerning for right acetabular and pelvic drain irregularity; will proceed with CT of the right hip. [JS]  [phone] Spoke with patient's daughter [name], updated her  of imaging results.  She confirms that patient is nonambulatory at baseline and is transferred from bed to wheelchair daily.  Daughter works in endoscopy suite at this facility; given patient's age and comorbidities, daughter does not wish surgery; specifically would not desire transfer to another facility given acetabular fracture.  Will discuss case with orthopedics on-call for further management recommendations and consult hospitalist services for evaluation and admission.  Patient will require higher level of care, SNF from this hospitalization. [JS]  [phone] Discussed with orthopedics on-call Dr. Karel Jarvis who has reviewed CT images.  Recommends 1g Tylenol every 8 hours standing order and Tramadol for pain control.  Considered Toradol but will hold off given patient is on ASA and Plavix. Will consult hospitalist services for evaluation and admission.  Orthopedics to see patient on rounds this morning.  Patient will need TOC consult for advanced level of care.  Daughter [name] was called again and updated; she is agreeable to plan of care for nonsurgical admission at our facility, pain control and social work consult for escalated level of care to SNF. [JS]    Clinical Course User Index [JS] Paulette Blanch, MD     FINAL CLINICAL IMPRESSION(S) / ED DIAGNOSES   Final diagnoses:  Fall, initial encounter  Closed displaced fracture of right acetabulum, unspecified portion of acetabulum, initial encounter ([place])  Closed nondisplaced fracture of pelvis, unspecified part of pelvis, initial encounter ([place])  AKI (acute kidney injury) ([place])     Rx / DC Orders   ED Discharge Orders     None        Note:  This document was prepared using Dragon voice recognition software and may include unintentional dictation errors.   Paulette Blanch, MD 05/05/22 [id number]    Paulette Blanch, MD 05/05/22 [phone]

## 2022-05-05 NOTE — Consult Note (Signed)
ORTHOPAEDIC CONSULTATION  REQUESTING PHYSICIAN: Collier Bullock, MD  Chief Complaint:   Fall with right pelvis fractures  History of Present Illness: Gabriella Rogers is a 88 y.o. female who was brought into the emergency room by EMS after found on the floor by staff at [facility] assisted living where she is in the memory care unit.  The patient states to me that she hurts but cannot localize where she hurts.  I discussed the case with the patient's daughter [name] who advised me that her memory axis and wanes and her orientation changes daily.  At baseline the patient is wheelchair-bound with pivot transfers in her assisted living facility.  She had a recent hospitalization for COVID-pneumonia and is currently on Plavix, aspirin, doxycycline.  The remainder of the patient's history is limited secondary to her mental status.   Past Medical History:  Diagnosis Date   Brain disorder 03/27/2014   Cervical dystonia 01/24/2012   Clinical depression 11/26/2015   Dizziness 11/26/2015   Overview:  Chronic, felt secondary to inner ear per ENT evaluation approximately 1996.    H/O total knee replacement 11/23/2015   Headache, migraine 11/26/2015   HLD (hyperlipidemia) 11/26/2015   Hypercholesteremia    Osteoporosis, post-menopausal 11/26/2015   Past Surgical History:  Procedure Laterality Date   ABDOMINAL HYSTERECTOMY     ANKLE SURGERY Left    KNEE SURGERY Right    Social History   Socioeconomic History   Marital status: Married    Spouse name: Not on file   Number of children: Not on file   Years of education: Not on file   Highest education level: Not on file  Occupational History   Not on file  Tobacco Use   Smoking status: Never   Smokeless tobacco: Never  Vaping Use   Vaping Use: Never used  Substance and Sexual Activity   Alcohol use: No   Drug use: Never   Sexual activity: Not on file  Other Topics Concern   Not on file   Social History Narrative   Not on file   Social Determinants of Health   Financial Resource Strain: Low Risk  (02/27/2021)   Overall Financial Resource Strain (CARDIA)    Difficulty of Paying Living Expenses: Not hard at all  Food Insecurity: No Food Insecurity (04/30/2022)   Hunger Vital Sign    Worried About Running Out of Food in the Last Year: Never true    Ran Out of Food in the Last Year: Never true  Transportation Needs: No Transportation Needs (04/30/2022)   PRAPARE - Hydrologist (Medical): No    Lack of Transportation (Non-Medical): No  Physical Activity: Inactive (02/27/2021)   Exercise Vital Sign    Days of Exercise per Week: 0 days    Minutes of Exercise per Session: 0 min  Stress: No Stress Concern Present (02/27/2021)   [place]    Feeling of Stress : Only a little  Social Connections: Socially Integrated (02/27/2021)   Social Connection and Isolation Panel [NHANES]    Frequency of Communication with Friends and Family: Three times a week    Frequency of Social Gatherings with Friends and Family: Three times a week    Attends Religious Services: 1 to 4 times per year    Active Member of Clubs or Organizations: Yes    Attends Archivist Meetings: 1 to 4 times per year    Marital Status: Married  Family History  Problem Relation Age of Onset   Stroke Maternal Grandfather    Stroke Paternal Grandfather    Kidney cancer Neg Hx    Hematuria Neg Hx    Allergies  Allergen Reactions   Actonel  [Risedronate Sodium]    [place]    Pneumococcal Vaccine Other (See Comments)    WEAKNESS, DISORIENTED, "ARM SORE, RED, HURTING"   Risedronate Other (See Comments)    REDNESS IN FACE   Alendronate Rash   Aleve [Naproxen Sodium] Rash   Pravastatin Rash   Prior to Admission medications   Medication Sig Start Date End Date Taking? Authorizing Provider  acetaminophen  (TYLENOL) 325 MG tablet Take 650 mg by mouth every 6 (six) hours as needed.    [provider]  amoxicillin-clavulanate (AUGMENTIN) 875-125 MG tablet Take 1 tablet by mouth 2 (two) times daily for 2 days. 05/04/22 05/06/22  Lurene Shadow, MD  aspirin EC 81 MG EC tablet Take 1 tablet (81 mg total) by mouth daily. Swallow whole. 10/14/21   Amin, Loura Halt, MD  atorvastatin (LIPITOR) 80 MG tablet Take 1 tablet (80 mg total) by mouth daily at 6 PM. 08/25/20   Corky Downs, MD  clopidogrel (PLAVIX) 75 MG tablet Take 1 tablet (75 mg total) by mouth daily. 09/29/21   Corky Downs, MD  doxycycline (VIBRAMYCIN) 100 MG capsule Take 1 capsule (100 mg total) by mouth 2 (two) times daily for 2 days. 05/04/22 05/06/22  Lurene Shadow, MD  famotidine (PEPCID) 20 MG tablet Take 20 mg by mouth 2 (two) times daily. 11/02/17   [provider]  hydrochlorothiazide (HYDRODIURIL) 12.5 MG tablet Take 12.5 mg by mouth daily. 04/14/22   [provider]  Multiple Vitamins-Minerals (ONE-A-DAY WOMENS PO) Take 1 tablet by mouth daily.    [provider]  PARoxetine (PAXIL) 10 MG tablet Take 10 mg by mouth daily. 07/29/14   [provider]  polyethylene glycol (MIRALAX / GLYCOLAX) 17 g packet Take 17 g by mouth daily as needed for moderate constipation or severe constipation. 10/13/21   Amin, Loura Halt, MD  White Petrolatum-Mineral Oil (ARTIFICIAL TEARS) ointment Place 1 drop into both eyes as needed.    [provider]   CT Hip Right Wo Contrast  Result Date: 05/05/2022 CLINICAL DATA:  Hip fracture suspected EXAM: CT OF THE RIGHT HIP WITHOUT CONTRAST TECHNIQUE: Multidetector CT imaging of the right hip was performed according to the standard protocol. Multiplanar CT image reconstructions were also generated. RADIATION DOSE REDUCTION: This exam was performed according to the departmental dose-optimization program which includes automated exposure control, adjustment of the mA  and/or kV according to patient size and/or use of iterative reconstruction technique. COMPARISON:  Pelvis radiography from earlier today FINDINGS: Confirmed branching right acetabular fracture with mild protrusio deformity on coronal reformats. There is a mildly displaced fracture through the inferior pubic ramus on the right. Pronounced osteopenia. Expected hemorrhage in the pelvis adjacent to the fracture. Simple right adnexal cyst measuring 2.7 cm. No follow-up imaging recommended. Note: This recommendation does not apply to premenarchal patients and to those with increased risk (genetic, family history, elevated tumor markers or other high-risk factors) of ovarian cancer. Reference: JACR 2020 Feb; 17(2):248-254 IMPRESSION: 1. Acute, branching fracture through the right superior acetabulum with mild protrusio deformity. 2. Nondisplaced right inferior pubic ramus fracture. 3. Pronounced osteopenia. 4. Expected soft tissue hemorrhage in the deep pelvis. Electronically Signed   By: Tiburcio Pea M.D.   On: 05/05/2022 06:31  DG Chest Port 1 View  Result Date: 05/05/2022 CLINICAL DATA:  Fall with cough.  COVID. EXAM: PORTABLE CHEST 1 VIEW COMPARISON:  04/30/2022 FINDINGS: Resolved left lower lobe pneumonia. Right apical opacity which is pleural based scarring and calcification by cervical spine CT. Normal heart size and mediastinal contours. No acute finding. IMPRESSION: Significantly improved aeration at the left base. No new or acute finding. Electronically Signed   By: Jorje Guild M.D.   On: 05/05/2022 05:05   DG Pelvis Portable  Result Date: 05/05/2022 CLINICAL DATA:  Fall.  Unsure which hip is painful EXAM: PORTABLE PELVIS 1-2 VIEWS COMPARISON:  01/09/2022 FINDINGS: Subjective osteopenia. Distorted iliopectineal line on the right, not seen previously, without discrete fracture line. Irregularity at the inferior right pubic ramus which also appears new and consistent with fracture. Remote fracture  of the right lateral iliac wing. Both hips appear located in the proximal femurs appear intact. IMPRESSION: New irregularity of the right acetabulum and obturator ring, recommend CT. Electronically Signed   By: Jorje Guild M.D.   On: 05/05/2022 05:03   CT Head Wo Contrast  Result Date: 05/05/2022 CLINICAL DATA:  Neck trauma.  Weakness and fall.  COVID positive EXAM: CT HEAD WITHOUT CONTRAST CT CERVICAL SPINE WITHOUT CONTRAST TECHNIQUE: Multidetector CT imaging of the head and cervical spine was performed following the standard protocol without intravenous contrast. Multiplanar CT image reconstructions of the cervical spine were also generated. RADIATION DOSE REDUCTION: This exam was performed according to the departmental dose-optimization program which includes automated exposure control, adjustment of the mA and/or kV according to patient size and/or use of iterative reconstruction technique. COMPARISON:  07/21/2016 FINDINGS: CT HEAD FINDINGS Brain: No evidence of acute infarction, hemorrhage, hydrocephalus, extra-axial collection or mass lesion/mass effect. Moderate brain atrophy and chronic small vessel ischemia. A chronic lacunar infarct has occurred in the right thalamus. Vascular: No hyperdense vessel or unexpected calcification. Skull: Normal. Negative for fracture or focal lesion. Sinuses/Orbits: Partial right mastoid opacification with negative nasopharynx. CT CERVICAL SPINE FINDINGS Alignment: No traumatic malalignment Skull base and vertebrae: No acute fracture.  Subjective osteopenia Soft tissues and spinal canal: No prevertebral fluid or swelling. No visible canal hematoma. Disc levels: Advanced and generalized degeneration without visible cord impingement. C3-4 facet ankylosis on the right where there is bulky spurring encroaching on the foramen. Upper chest: Pleural based scarring with calcification at the right apex. IMPRESSION: 1. No evidence of acute intracranial or cervical spine injury.  2. Right mastoid opacification. 3. Degenerative and senescent changes described above. Electronically Signed   By: Jorje Guild M.D.   On: 05/05/2022 04:51   CT Cervical Spine Wo Contrast  Result Date: 05/05/2022 CLINICAL DATA:  Neck trauma.  Weakness and fall.  COVID positive EXAM: CT HEAD WITHOUT CONTRAST CT CERVICAL SPINE WITHOUT CONTRAST TECHNIQUE: Multidetector CT imaging of the head and cervical spine was performed following the standard protocol without intravenous contrast. Multiplanar CT image reconstructions of the cervical spine were also generated. RADIATION DOSE REDUCTION: This exam was performed according to the departmental dose-optimization program which includes automated exposure control, adjustment of the mA and/or kV according to patient size and/or use of iterative reconstruction technique. COMPARISON:  07/21/2016 FINDINGS: CT HEAD FINDINGS Brain: No evidence of acute infarction, hemorrhage, hydrocephalus, extra-axial collection or mass lesion/mass effect. Moderate brain atrophy and chronic small vessel ischemia. A chronic lacunar infarct has occurred in the right thalamus. Vascular: No hyperdense vessel or unexpected calcification. Skull: Normal. Negative for fracture or focal lesion. Sinuses/Orbits: Partial  right mastoid opacification with negative nasopharynx. CT CERVICAL SPINE FINDINGS Alignment: No traumatic malalignment Skull base and vertebrae: No acute fracture.  Subjective osteopenia Soft tissues and spinal canal: No prevertebral fluid or swelling. No visible canal hematoma. Disc levels: Advanced and generalized degeneration without visible cord impingement. C3-4 facet ankylosis on the right where there is bulky spurring encroaching on the foramen. Upper chest: Pleural based scarring with calcification at the right apex. IMPRESSION: 1. No evidence of acute intracranial or cervical spine injury. 2. Right mastoid opacification. 3. Degenerative and senescent changes described  above. Electronically Signed   By: Tiburcio Pea M.D.   On: 05/05/2022 04:51    Positive ROS: All other systems have been reviewed and were otherwise negative with the exception of those mentioned in the HPI and as above.  Physical Exam: General:  Alert, no acute distress Psychiatric:  Patient is competent for consent with normal mood and affect   Cardiovascular:  No pedal edema Respiratory:  No wheezing, non-labored breathing GI:  Abdomen is soft and non-tender Skin:  No lesions in the area of chief complaint Neurologic:  Sensation intact distally Lymphatic:  No axillary or cervical lymphadenopathy  Orthopedic Exam:  Right lower extremity Skin intact Mildly tender to palpation over pubis and anterior hip Painful with logroll Neurovascularly intact distally to the foot with good plantarflexion dorsiflexion of all toes  Secondary survey No tenderness to palpation over other bony prominences of the bilateral upper extremities and left lower extremity no pain with left lower extremity logroll no tenderness to palpation over the cervical and thoracic spine no step-off  X-rays:  Imaging reviewed above x-ray of the pelvis showing a right inferior pubic ramus fracture CT scan of the right hip shows a acetabular fracture with a few millimeters of displacement extending into the superior acetabular dome with a small amount of superior medialization of the femoral head  Assessment: Right inferior pubic ramus fracture Right acetabular fracture  Plan: I had an extensive conversation with the patient and the patient's daughter on the phone about the fractures and fracture pattern.  We discussed the natural history and course of these fractures, healing and treatment options including nonoperative management and surgical management with possible transfer if needed.  The patient is wheelchair-bound at baseline with pivot transfers. the family and patient would like to proceed with a nonoperative  course of treatment for both fractures at this time which I feel is reasonable given the patient's mental status and baseline ambulation.  We will encourage the patient to attempt toe-touch weightbearing on the right lower extremity with weightbearing as tolerated on the left lower extremity when pivoting for transfers, however given her mental status this may be difficult to educate.  If this proves untenable then we can proceed with gentle weightbearing on the right lower extremity for transfers only.  We will follow-up with the patient outpatient in 2 weeks for repeat x-rays of the pelvis to assess for any fracture motion and healing.  All questions answered and the patient's daughter agrees with the above plan.  Is no plan for any surgical intervention at this time. Thank you for this consult.    Reinaldo Berber MD  Beeper #:  [id number]  05/05/2022 8:15 AM

## 2022-05-05 NOTE — ED Notes (Signed)
Informed RN bed assigned 

## 2022-05-05 NOTE — Assessment & Plan Note (Signed)
History of prior stroke on dual antiplatelet therapy and atorvastatin Hold aspirin and Plavix for now due to CT scan findings suggestive of expected soft tissue hemorrhage in the deep pelvis. May consider resuming aspirin and Plavix if H&H remained stable Continue atorvastatin

## 2022-05-05 NOTE — Assessment & Plan Note (Signed)
Recent hospitalization for COVID-19 viral illness Patient will remain on isolation

## 2022-05-05 NOTE — Progress Notes (Signed)
Initial Nutrition Assessment  DOCUMENTATION CODES:   Non-severe (moderate) malnutrition in context of chronic illness  INTERVENTION:   -Liberalize diet to regular for wider variety of meal selections -MVI with minerals daily -Ensure Enlive po BID, each supplement provides 350 kcal and 20 grams of protein -Feeding assistance with meals  NUTRITION DIAGNOSIS:   Moderate Malnutrition related to chronic illness (dementia) as evidenced by mild fat depletion, mild muscle depletion.  GOAL:   Patient will meet greater than or equal to 90% of their needs  MONITOR:   PO intake, Supplement acceptance  REASON FOR ASSESSMENT:   Malnutrition Screening Tool    ASSESSMENT:   Pt with medical history significant  for dementia, depression, history of prior stroke, dyslipidemia, recent hospitalization for COVID-19 viral illness with superimposed bacterial pneumonia whois admitted for evaluation of an unwitnessed fall.  Pt admitted with unwitnessed fall, COVID-19, closed rt acetabular fracture, and closed fracture of rt interior pubic ramus.   Reviewed I/O's: +500 ml x 24 hours   Per orthopedics notes, no plans for surgical intervention.   Pt sitting up in bed; lunch tray ion front of her untouched. RD assisted pt with cutting up meat and opening containers. Pt shares that her appetite is "ok". Pt unable to provide history secondary to dementia. Pt is currently on a heart healthy diet.   Reviewed wt hx; no wt loss noted over the past 3 months.   Medications reviewed and include 0.9% sodium chloride infusion @ 50 ml/hr.   Lab Results  Component Value Date   HGBA1C 5.5 10/07/2021   PTA DM medications are none.   Labs reviewed: CBGS: 131 (inpatient orders for glycemic control are none).    NUTRITION - FOCUSED PHYSICAL EXAM:  Flowsheet Row Most Recent Value  Orbital Region No depletion  Upper Arm Region Mild depletion  Thoracic and Lumbar Region No depletion  Buccal Region Mild  depletion  Temple Region Mild depletion  Clavicle Bone Region Mild depletion  Clavicle and Acromion Bone Region Mild depletion  Scapular Bone Region Mild depletion  Dorsal Hand Mild depletion  Patellar Region Mild depletion  Anterior Thigh Region Mild depletion  Posterior Calf Region Mild depletion  Edema (RD Assessment) None  Hair Reviewed  Eyes Reviewed  Mouth Reviewed  Skin Reviewed  Nails Reviewed       Diet Order:   Diet Order             Diet Heart Room service appropriate? Yes; Fluid consistency: Thin  Diet effective now                   EDUCATION NEEDS:   Education needs have been addressed  Skin:  Skin Assessment: Reviewed RN Assessment  Last BM:  05/02/22  Height:   Ht Readings from Last 1 Encounters:  05/05/22 5\' 3"  (1.6 m)    Weight:   Wt Readings from Last 1 Encounters:  05/05/22 51.9 kg    Ideal Body Weight:  52.3 kg  BMI:  Body mass index is 20.28 kg/m.  Estimated Nutritional Needs:   Kcal:  1550-1750  Protein:  75-90 grams  Fluid:  > 1.5 L    05/07/22, RD, LDN, CDCES Registered Dietitian II Certified Diabetes Care and Education Specialist Please refer to [facility] for RD and/or RD on-call/weekend/after hours pager

## 2022-05-05 NOTE — Assessment & Plan Note (Signed)
Patient noted to be bradycardic on the monitor Donepezil was discontinued during her last hospitalization due to sinus bradycardia Obtain TSH She is started post an unwitnessed fall and it is unclear if she had a syncopal episode. Patient's history is limited due to her underlying dementia

## 2022-05-05 NOTE — H&P (Addendum)
History and Physical    Patient: Gabriella Rogers LOV:[id number] DOB: 1934-04-09 DOA: 05/05/2022 DOS: the patient was seen and examined on 05/05/2022 PCP: Corky Downs, MD  Patient coming from: Assisted living facility  Chief Complaint:  Chief Complaint  Patient presents with   Fall    BIBA for fall. Per EMS patient was found by [facility] staff on the bathroom floor. Unsure of LOC. Complains of left ear discomfort, posterior head and right hip pain. Patient is Covid positive.    Most of the history was obtained from patient's daughter over the phone as well as EMR HPI: Gabriella Rogers is a 88 y.o. female with medical history significant  for dementia, depression, history of prior stroke, dyslipidemia, recent hospitalization for COVID-19 viral illness with superimposed bacterial pneumonia who returns to the emergency room for evaluation of an unwitnessed fall.  Per ER documentation patient was found on the bathroom floor with complaints of right hip pain.  Patient states that she thinks the floor was wet and she tripped and fell.  She is unable to tell me if she was dizzy or lightheaded. Patient's daughter states that this is her second fall in the last couple of months. History is limited due to patient's underlying dementia. Pelvic x-ray shows new irregularity of the right acetabulum and obturator ring, recommend CT. CT of the right hip without contrast shows acute, branching fracture through the right superior acetabulum with mild protrusio deformity. Nondisplaced right inferior pubic ramus fracture. Pronounced osteopenia. Expected soft tissue hemorrhage in the deep pelvis. Imaging findings were discussed with patient's daughter who confirms that patient is nonambulatory and only transfers from bed to wheelchair and because of her multiple comorbidities she request nonsurgical management for her mother's injuries. Patient will be admitted to the hospital for pain  control Unable to do a review of systems due to her underlying dementia  Review of Systems: unable to review all systems due to the inability of the patient to answer questions. Past Medical History:  Diagnosis Date   Brain disorder 03/27/2014   Cervical dystonia 01/24/2012   Clinical depression 11/26/2015   Dizziness 11/26/2015   Overview:  Chronic, felt secondary to inner ear per ENT evaluation approximately 1996.    H/O total knee replacement 11/23/2015   Headache, migraine 11/26/2015   HLD (hyperlipidemia) 11/26/2015   Hypercholesteremia    Osteoporosis, post-menopausal 11/26/2015   Past Surgical History:  Procedure Laterality Date   ABDOMINAL HYSTERECTOMY     ANKLE SURGERY Left    KNEE SURGERY Right    Social History:  reports that she has never smoked. She has never used smokeless tobacco. She reports that she does not drink alcohol and does not use drugs.  Allergies  Allergen Reactions   Actonel  [Risedronate Sodium]    Piper    Pneumococcal Vaccine Other (See Comments)    WEAKNESS, DISORIENTED, "ARM SORE, RED, HURTING"   Risedronate Other (See Comments)    REDNESS IN FACE   Alendronate Rash   Aleve [Naproxen Sodium] Rash   Pravastatin Rash    Family History  Problem Relation Age of Onset   Stroke Maternal Grandfather    Stroke Paternal Grandfather    Kidney cancer Neg Hx    Hematuria Neg Hx     Prior to Admission medications   Medication Sig Start Date End Date Taking? Authorizing Provider  acetaminophen (TYLENOL) 325 MG tablet Take 650 mg by mouth every 6 (six) hours as needed.  [provider]  amoxicillin-clavulanate (AUGMENTIN) 875-125 MG tablet Take 1 tablet by mouth 2 (two) times daily for 2 days. 05/04/22 05/06/22  Lurene ShadowAyiku, Bernard, MD  aspirin EC 81 MG EC tablet Take 1 tablet (81 mg total) by mouth daily. Swallow whole. 10/14/21   Amin, Loura HaltAnkit Chirag, MD  atorvastatin (LIPITOR) 80 MG tablet Take 1 tablet (80 mg total) by mouth daily at 6 PM. 08/25/20   Corky DownsMasoud,  Javed, MD  clopidogrel (PLAVIX) 75 MG tablet Take 1 tablet (75 mg total) by mouth daily. 09/29/21   Corky DownsMasoud, Javed, MD  doxycycline (VIBRAMYCIN) 100 MG capsule Take 1 capsule (100 mg total) by mouth 2 (two) times daily for 2 days. 05/04/22 05/06/22  Lurene ShadowAyiku, Bernard, MD  famotidine (PEPCID) 20 MG tablet Take 20 mg by mouth 2 (two) times daily. 11/02/17   [provider]  hydrochlorothiazide (HYDRODIURIL) 12.5 MG tablet Take 12.5 mg by mouth daily. 04/14/22   [provider]  Multiple Vitamins-Minerals (ONE-A-DAY WOMENS PO) Take 1 tablet by mouth daily.    [provider]  PARoxetine (PAXIL) 10 MG tablet Take 10 mg by mouth daily. 07/29/14   [provider]  polyethylene glycol (MIRALAX / GLYCOLAX) 17 g packet Take 17 g by mouth daily as needed for moderate constipation or severe constipation. 10/13/21   Amin, Loura HaltAnkit Chirag, MD  White Petrolatum-Mineral Oil (ARTIFICIAL TEARS) ointment Place 1 drop into both eyes as needed.    [provider]    Physical Exam: Vitals:   05/05/22 0730 05/05/22 0757 05/05/22 0800 05/05/22 0820  BP: (!) 85/47 97/66 (!) 95/40 (!) 83/54  Pulse: (!) 59  (!) 58 63  Resp: 14  14 14   Temp:      TempSrc:      SpO2: 95%  95% 96%  Weight:      Height:       Physical Exam Vitals and nursing note reviewed.  Constitutional:      Appearance: Normal appearance.     Comments: Appears comfortable and in no distress  HENT:     Head: Normocephalic.     Nose: Nose normal.     Mouth/Throat:     Mouth: Mucous membranes are dry.  Eyes:     Comments: Pale conjunctiva  Cardiovascular:     Rate and Rhythm: Normal rate and regular rhythm.  Pulmonary:     Effort: Pulmonary effort is normal.     Breath sounds: Normal breath sounds.  Abdominal:     General: Abdomen is flat. Bowel sounds are normal.     Palpations: Abdomen is soft.  Musculoskeletal:     Cervical back: Normal range of motion and neck supple.     Comments: Decreased range  of motion right hip  Skin:    General: Skin is warm and dry.  Neurological:     Mental Status: She is alert.     Motor: Weakness present.  Psychiatric:        Mood and Affect: Mood normal.        Behavior: Behavior normal.     Data Reviewed: Relevant notes from primary care and specialist visits, past discharge summaries as available in EHR, including Care Everywhere. Prior diagnostic testing as pertinent to current admission diagnoses Updated medications and problem lists for reconciliation ED course, including vitals, labs, imaging, treatment and response to treatment Triage notes, nursing and pharmacy notes and ED provider's notes Notable results as noted in HPI Labs reviewed.  Sodium 138, potassium 3.5, chloride 107,  bicarb 21, glucose 109, BUN 36, creatinine 1.13 compared to baseline of 0.76, calcium 8.6, total protein 7.0, albumin 3.5, AST 89, ALT 57, alkaline phosphatase 73, total bilirubin 1.2, troponin 10, white count 13.6, hemoglobin 12.3, hematocrit 35.6, platelet count 270 CT scan of the head and cervical spine shows no evidence of acute intracranial or cervical spine injury. Right mastoid opacification. Degenerative and senescent changes described above. Chest x-ray reviewed by me shows significantly improved aeration at the left base. No new or acute finding. Twelve-lead EKG reviewed by me shows sinus bradycardia There are no new results to review at this time.  Assessment and Plan: * Unwitnessed fall Patient was brought into the ER for evaluation of unwitnessed fall with right-sided hip pain and imaging shows a right superior acetabulum fracture with mild protrusio deformity and non displaced right inferior pubic ramus fracture. We will place patient on fall precautions Orthopedic surgery consult  COVID-19 Recent hospitalization for COVID-19 viral illness Patient will remain on isolation  Closed fracture of right inferior pubic ramus (HCC) Status post fall with  closed fracture of right inferior pubic ramus Pain control with scheduled acetaminophen and as needed tramadol per Ortho recommendation Fall precautions PT evaluation  Closed right acetabular fracture (HCC) Status post fall with a closed right acetabular fracture Nonsurgical management Pain control with scheduled acetaminophen and as needed tramadol Fall precautions Orthopedic surgery consult  AKI (acute kidney injury) (HCC) Most likely related to diuretic therapy Patient has a baseline serum creatinine of 0.7 and today on admission it is 1.13 She has dry mucous membranes and relative hypotension Judicious IV fluid resuscitation Hold HCTZ Repeat renal parameter is in a.m.  Sinus bradycardia Patient noted to be bradycardic on the monitor Donepezil was discontinued during her last hospitalization due to sinus bradycardia Obtain TSH She is started post an unwitnessed fall and it is unclear if she had a syncopal episode. Patient's history is limited due to her underlying dementia  Essential hypertension Hold hydrochlorothiazide due to relative hypotension  Dementia without behavioral disturbance (HCC) Continue Paxil  CVA (cerebral vascular accident) (HCC) History of prior stroke on dual antiplatelet therapy and atorvastatin Hold aspirin and Plavix for now due to CT scan findings suggestive of expected soft tissue hemorrhage in the deep pelvis. May consider resuming aspirin and Plavix if H&H remained stable Continue atorvastatin      Advance Care Planning:   Code Status: DNR   Consults: Orthopedic surgery  Family Communication: Greater than 50% of time was spent discussing patient's condition and plan of care with her daughter and healthcare power of attorney Shon Hough over the phone.  All questions and concerns have been addressed.  She verbalizes understanding and agrees with the plan.  CODE STATUS was discussed and she wishes to be a DO NOT RESUSCITATE  Severity of  Illness: The appropriate patient status for this patient is INPATIENT. Inpatient status is judged to be reasonable and necessary in order to provide the required intensity of service to ensure the patient's safety. The patient's presenting symptoms, physical exam findings, and initial radiographic and laboratory data in the context of their chronic comorbidities is felt to place them at high risk for further clinical deterioration. Furthermore, it is not anticipated that the patient will be medically stable for discharge from the hospital within 2 midnights of admission.   * I certify that at the point of admission it is my clinical judgment that the patient will require inpatient hospital care spanning beyond 2 midnights from  the point of admission due to high intensity of service, high risk for further deterioration and high frequency of surveillance required.*  Author: Lucile Shutters, MD 05/05/2022 9:13 AM  For on call review [email or web address].

## 2022-05-05 NOTE — Assessment & Plan Note (Signed)
Hold hydrochlorothiazide due to relative hypotension

## 2022-05-05 NOTE — ED Notes (Signed)
Covid +, precautions initiated

## 2022-05-06 ENCOUNTER — Telehealth: Payer: Self-pay

## 2022-05-06 DIAGNOSIS — I639 Cerebral infarction, unspecified: Secondary | ICD-10-CM

## 2022-05-06 DIAGNOSIS — S32591A Other specified fracture of right pubis, initial encounter for closed fracture: Secondary | ICD-10-CM | POA: Diagnosis not present

## 2022-05-06 DIAGNOSIS — E876 Hypokalemia: Secondary | ICD-10-CM

## 2022-05-06 DIAGNOSIS — I10 Essential (primary) hypertension: Secondary | ICD-10-CM

## 2022-05-06 DIAGNOSIS — F0390 Unspecified dementia without behavioral disturbance: Secondary | ICD-10-CM

## 2022-05-06 DIAGNOSIS — S32481A Displaced dome fracture of right acetabulum, initial encounter for closed fracture: Secondary | ICD-10-CM | POA: Diagnosis not present

## 2022-05-06 DIAGNOSIS — E440 Moderate protein-calorie malnutrition: Secondary | ICD-10-CM

## 2022-05-06 DIAGNOSIS — U071 COVID-19: Secondary | ICD-10-CM

## 2022-05-06 DIAGNOSIS — R296 Repeated falls: Secondary | ICD-10-CM | POA: Diagnosis not present

## 2022-05-06 DIAGNOSIS — N179 Acute kidney failure, unspecified: Secondary | ICD-10-CM

## 2022-05-06 DIAGNOSIS — R001 Bradycardia, unspecified: Secondary | ICD-10-CM

## 2022-05-06 LAB — BASIC METABOLIC PANEL
Anion gap: 3 — ABNORMAL LOW (ref 5–15)
BUN: 29 mg/dL — ABNORMAL HIGH (ref 8–23)
CO2: 24 mmol/L (ref 22–32)
Calcium: 7.9 mg/dL — ABNORMAL LOW (ref 8.9–10.3)
Chloride: 112 mmol/L — ABNORMAL HIGH (ref 98–111)
Creatinine, Ser: 0.72 mg/dL (ref 0.44–1.00)
GFR, Estimated: >60 mL/min (ref >60)
Glucose, Bld: 85 mg/dL (ref 70–99)
Potassium: 3.2 mmol/L — ABNORMAL LOW (ref 3.5–5.1)
Sodium: 139 mmol/L (ref 135–145)

## 2022-05-06 LAB — CBC
HCT: 27.3 % — ABNORMAL LOW (ref 36.0–46.0)
Hemoglobin: 9.3 g/dL — ABNORMAL LOW (ref 12.0–15.0)
MCH: 33.7 pg (ref 26.0–34.0)
MCHC: 34.1 g/dL (ref 30.0–36.0)
MCV: 98.9 fL (ref 80.0–100.0)
Platelets: 216 10*3/uL (ref 150–400)
RBC: 2.76 MIL/uL — ABNORMAL LOW (ref 3.87–5.11)
RDW: 14 % (ref 11.5–15.5)
WBC: 7 10*3/uL (ref 4.0–10.5)
nRBC: 0 % (ref 0.0–0.2)

## 2022-05-06 LAB — URINALYSIS, ROUTINE W REFLEX MICROSCOPIC
Bilirubin Urine: NEGATIVE
Glucose, UA: NEGATIVE mg/dL
Ketones, ur: NEGATIVE mg/dL
Nitrite: NEGATIVE
Protein, ur: 30 mg/dL — AB
Specific Gravity, Urine: 1.033 — ABNORMAL HIGH (ref 1.005–1.030)
pH: 5 (ref 5.0–8.0)

## 2022-05-06 MED ORDER — POTASSIUM CHLORIDE CRYS ER 20 MEQ PO TBCR
40.0000 meq | EXTENDED_RELEASE_TABLET | Freq: Two times a day (BID) | ORAL | Status: AC
  Administered 2022-05-06 (×2): 40 meq via ORAL
  Filled 2022-05-06 (×2): qty 2

## 2022-05-06 NOTE — Plan of Care (Signed)

## 2022-05-06 NOTE — TOC Progression Note (Signed)
Transition of Care [facility]) - Progression Note    Patient Details  Name: Gabriella Rogers MRN: [id number] Date of Birth: [birth year]  [facility] CM/SW Contact  [name], RN Phone Number: 05/06/2022, 12:15 PM  Clinical Narrative:   Spoke with the patient's daughter [name], she would like [place] to find a STR to transition into LTC I explained that I would be happy to reach out and do a bedsearch looking for STR to transition to LTC         Expected Discharge Plan and Services                                                 Social Determinants of Health (SDOH) Interventions    Readmission Risk Interventions    05/01/2022    4:23 PM  Readmission Risk Prevention Plan  Transportation Screening Complete  PCP or Specialist Appt within 3-5 Days Complete  HRI or Home Care Consult Complete  Social Work Consult for Recovery Care Planning/Counseling Complete  Palliative Care Screening Not Applicable  Medication Review Oceanographer) Complete

## 2022-05-06 NOTE — Telephone Encounter (Signed)
Patient still in hospital. When discharged will be sent to long term SNF Karena Addison, LPN [facility] Nurse Health Advisor Direct Dial [phone]

## 2022-05-06 NOTE — Progress Notes (Signed)
Verbal order received by MD Lyn Hollingshead to d/c tele.

## 2022-05-06 NOTE — NC FL2 (Addendum)
[place] MEDICAID FL2 LEVEL OF CARE SCREENING TOOL     IDENTIFICATION  Patient Name: Gabriella Rogers Birthdate: Jul 13, 1933 Sex: female Admission Date (Current Location): 05/05/2022  [facility] and [place] Number:  Chiropodist and Address:  [facility], [address], [place], [place] [address]      Provider Number: [id number]  Attending Physician Name and Address:  Sunnie Nielsen, DO  Relative Name and Phone Number:  Payton Mccallum [phone]    Current Level of Care: Hospital Recommended Level of Care: SNF Prior Approval Number:    Date Approved/Denied:   PASRR Number:  pending  Discharge Plan: Domiciliary (Rest home)    Current Diagnoses: Patient Active Problem List   Diagnosis Date Noted   Malnutrition of moderate degree 05/06/2022   Closed right acetabular fracture (HCC) 05/05/2022   Closed fracture of right inferior pubic ramus (HCC) 05/05/2022   Unwitnessed fall 05/05/2022   Sinus bradycardia 05/02/2022   COVID-19 04/30/2022   Lobar pneumonia (HCC) 04/30/2022   Dementia without behavioral disturbance (HCC) 04/30/2022   Essential hypertension 04/30/2022   Ear drainage, bilateral    Sensorineural hearing loss (SNHL) of both ears    AKI (acute kidney injury) (HCC) 10/07/2021   Hypokalemia 10/07/2021   Macrocytic anemia 10/07/2021   Leukocytosis 10/07/2021   Generalized weakness 10/07/2021   CVA (cerebral vascular accident) (HCC) 10/07/2021   Urinary tract infection without hematuria 04/13/2021   Acute cystitis without hematuria 04/13/2021   Urethritis 04/13/2021   Painful urination 01/07/2021   Wax in ear 02/26/2020   Acute CVA (cerebrovascular accident) (HCC) 01/26/2020   Depression 11/26/2015   Dizziness 11/26/2015   Hyperlipidemia, unspecified 11/26/2015   Headache, migraine 11/26/2015   Osteoporosis, post-menopausal 11/26/2015   H/O total knee replacement 11/23/2015   Arthropathy, traumatic, knee 03/05/2015    Brain disorder 03/27/2014   Cervical dystonia 01/24/2012    Orientation RESPIRATION BLADDER Height & Weight     Self  Normal External catheter Weight: 51.9 kg Height:  5\' 3"  (160 cm)  BEHAVIORAL SYMPTOMS/MOOD NEUROLOGICAL BOWEL NUTRITION STATUS     (Memory impaired, Poor judgment) Continent Diet (See DC summary)  AMBULATORY STATUS COMMUNICATION OF NEEDS Skin   Limited Assist Verbally Other (Comment) (redness perineum)                       Personal Care Assistance Level of Assistance  Bathing, Feeding, Dressing Bathing Assistance: Limited assistance Feeding assistance: Limited assistance Dressing Assistance: Limited assistance     Functional Limitations Info  Sight, Hearing, Speech Sight Info: Impaired Hearing Info: Impaired Speech Info: Adequate    SPECIAL CARE FACTORS FREQUENCY  PT (By licensed PT), OT (By licensed OT)     PT Frequency: 2 times per week OT Frequency: 2 times per week            Contractures Contractures Info: Not present    Additional Factors Info  Code Status Code Status Info: DNR             Current Medications (05/06/2022):  This is the current hospital active medication list Current Facility-Administered Medications  Medication Dose Route Frequency Provider Last Rate Last Admin   acetaminophen (TYLENOL) tablet 1,000 mg  1,000 mg Oral Q8H 05/08/2022, MD   1,000 mg at 05/06/22 0614   amoxicillin-clavulanate (AUGMENTIN) 500-125 MG per tablet 1 tablet  1 tablet Oral BID Agbata, Tochukwu, MD   1 tablet at 05/06/22 1009   artificial tears (LACRILUBE)  ophthalmic ointment 1 Application  1 Application Both Eyes PRN Agbata, Tochukwu, MD       aspirin EC tablet 81 mg  81 mg Oral Daily Agbata, Tochukwu, MD   81 mg at 05/06/22 1009   atorvastatin (LIPITOR) tablet 80 mg  80 mg Oral q1800 Agbata, Tochukwu, MD   80 mg at 05/05/22 2148   clopidogrel (PLAVIX) tablet 75 mg  75 mg Oral Daily Agbata, Tochukwu, MD   75 mg at 05/06/22 1009    doxycycline (VIBRA-TABS) tablet 100 mg  100 mg Oral BID Agbata, Tochukwu, MD   100 mg at 05/06/22 1009   famotidine (PEPCID) tablet 20 mg  20 mg Oral Daily Agbata, Tochukwu, MD   20 mg at 05/06/22 1009   feeding supplement (ENSURE ENLIVE / ENSURE PLUS) liquid 237 mL  237 mL Oral BID BM Agbata, Tochukwu, MD   237 mL at 05/06/22 1010   multivitamin with minerals tablet 1 tablet  1 tablet Oral Daily Agbata, Tochukwu, MD       ondansetron (ZOFRAN) tablet 4 mg  4 mg Oral Q6H PRN Agbata, Tochukwu, MD       Or   ondansetron (ZOFRAN) injection 4 mg  4 mg Intravenous Q6H PRN Agbata, Tochukwu, MD       PARoxetine (PAXIL) tablet 10 mg  10 mg Oral Daily Agbata, Tochukwu, MD   10 mg at 05/06/22 1009   polyethylene glycol (MIRALAX / GLYCOLAX) packet 17 g  17 g Oral Daily PRN Agbata, Tochukwu, MD       potassium chloride SA (KLOR-CON M) CR tablet 40 mEq  40 mEq Oral BID Sunnie Nielsen, DO   40 mEq at 05/06/22 1009   traMADol (ULTRAM) tablet 50 mg  50 mg Oral Q6H PRN Irean Hong, MD         Discharge Medications: Please see discharge summary for a list of discharge medications.  Relevant Imaging Results:  Relevant Lab Results:   Additional Information SSN: [id number]  [name], RN

## 2022-05-06 NOTE — Progress Notes (Signed)
PROGRESS NOTE    Gabriella Rogers   [id number] DOB: [birth year]  DOA: 05/05/2022 Date of Service: 05/06/22 PCP: Cletis Athens, MD     Brief Narrative / Hospital Course:  Gabriella Rogers is a 88 y.o. female with medical history significant  for dementia, depression, history of prior stroke, dyslipidemia, recent hospitalization for COVID-19 viral illness with superimposed bacterial pneumonia who returns to the emergency room for evaluation of an unwitnessed fall.  Per ER documentation patient was found on the bathroom floor by staff at [facility] with complaints of right hip pain. Patient states that she thinks the floor was wet and she tripped and fell.  11/15: Pelvic x-ray shows new irregularity of the right acetabulum and obturator ring. CT of the right hip without contrast shows acute, branching fracture through the right superior acetabulum with mild protrusion deformity. Nondisplaced right inferior pubic ramus fracture. Pronounced osteopenia. Expected soft tissue hemorrhage in the deep pelvis. Admitting hospitalist notes that daughter requests nonsurgical intervention, she also confirms pt is nonambulatory but can typically transfer bed/wheelchair. Dementia limits ability to perform complete ROS / obtain much history from the patient. Pt admitted for pain control and orthopedic consult, treat AKI, eval possible causes syncope  11/16: AKI resolved, VSS. Ortho clear for discharge from their standpoint. PT/OT eval pending. TOC following, suspect will need SNF for long term care.   Consultants:  Orthopedic Surgery   Procedures: none      ASSESSMENT & PLAN:   Principal Problem:   Unwitnessed fall Active Problems:   COVID-19   Closed right acetabular fracture (HCC)   Closed fracture of right inferior pubic ramus (HCC)   AKI (acute kidney injury) ([place])   CVA (cerebral vascular accident) ([place])   Dementia without behavioral disturbance ([place])   Essential  hypertension   Sinus bradycardia   Malnutrition of moderate degree  Closed right acetabular fracture (HCC) Status post fall with a closed right acetabular fracture Nonsurgical management if possible Pain control with scheduled acetaminophen and as needed tramadol per Ortho recommendation Fall precautions Orthopedic surgery consult PT/OT Pt may need SNF for long term care   Closed fracture of right inferior pubic ramus ([place]) Status post fall with closed fracture of right inferior pubic ramus Nonsurgical management if possible Pain control with scheduled acetaminophen and as needed tramadol per Ortho recommendation Fall precautions Orthopedic surgery consult PT/OT Pt may need SNF for long term care   Sinus bradycardia Patient noted to be bradycardic on the monitor She is started post an unwitnessed fall and it is unclear if she had a syncopal episode. Donepezil was discontinued during her last hospitalization due to sinus bradycardia Obtain TSH Patient's history is limited due to her underlying dementia  AKI (acute kidney injury) ([place]) - resolved Most likely related to diuretic therapy Patient has a baseline serum creatinine of 0.7 and on admission it is 1.13 Judicious IV fluid resuscitation Hold HCTZ Repeat renal parameter is in a.m. --> improved   Hypokalmia Repleted Monitor BMP  Essential hypertension Hold hydrochlorothiazide due to relative hypotension and AKI  Dementia without behavioral disturbance (HCC) Continue Paxil  CVA (cerebral vascular accident) ([place]) History of prior stroke on dual antiplatelet therapy and atorvastatin Hold aspirin and Plavix for now due to CT scan findings suggestive of expected soft tissue hemorrhage in the deep pelvis --> this was restarted by admitting hospitalist after consultation w/ Dr. Karel Jarvis  c continue atorvastatin  COVID-19 Lobar pneumonia - improved on CXR Recent hospitalization for COVID-19  viral illness w/ lobar  pneumonia Patient will remain on isolation Finishing abx for pneumonia   Unwitnessed fall Patient was brought into the ER for evaluation of unwitnessed fall with right-sided hip pain and imaging shows a right superior acetabulum fracture with mild protrusio deformity and non displaced right inferior pubic ramus fracture. We will place patient on fall precautions Orthopedic surgery consult PT/OT    DVT prophylaxis: on ASA/Plavix, holding DVT Ppx d/t anemia/bleed concern, pt on SCD Pertinent IV fluids/nutrition: no continuous fluids Central lines / invasive devices: none  Code Status: DNR Family Communication: spoke w/ daughter today on the phone   Disposition: inpatient  TOC needs: expect will need SNF  for long term care  Barriers to discharge / significant pending items: ortho recs, pain control, monitoring anemia / possible bleeding              Subjective:  Patient reports pain is controlled, no CP/SOB, no other complaints or concerns        Objective:  Vitals:   05/05/22 0951 05/05/22 2018 05/06/22 0151 05/06/22 0837  BP:  (!) 112/57 122/61 124/71  Pulse: 64 74 70 74  Resp: 20 16 16    Temp:  98.1 F (36.7 C) 98 F (36.7 C) 97.8 F (36.6 C)  TempSrc:  Oral    SpO2: 95% 97% 97% 100%  Weight:      Height:        Intake/Output Summary (Last 24 hours) at 05/06/2022 1416 Last data filed at 05/06/2022 1133 Gross per 24 hour  Intake 432.5 ml  Output 250 ml  Net 182.5 ml   Filed Weights   05/05/22 0515  Weight: 51.9 kg    Examination:  Constitutional:  VS as above General Appearance: alert, well-developed, well-nourished, NAD Respiratory: Normal respiratory effort CTAB Cardiovascular: S1/S2 normal RRR No rub/gallop auscultated No lower extremity edema Gastrointestinal: No tenderness No masses No hernia appreciated Musculoskeletal:  No clubbing/cyanosis of digits Symmetrical movement in all extremities Neurological: No cranial nerve  deficit on limited exam Alert - oriented to person, place [facility] year, disoriented to time (says Sept)   Psychiatric: Fair judgment/insight Normal mood and affect       Scheduled Medications:   acetaminophen  1,000 mg Oral Q8H   amoxicillin-clavulanate  1 tablet Oral BID   aspirin EC  81 mg Oral Daily   atorvastatin  80 mg Oral q1800   clopidogrel  75 mg Oral Daily   doxycycline  100 mg Oral BID   famotidine  20 mg Oral Daily   feeding supplement  237 mL Oral BID BM   multivitamin with minerals  1 tablet Oral Daily   PARoxetine  10 mg Oral Daily   potassium chloride  40 mEq Oral BID    Continuous Infusions:   PRN Medications:  artificial tears, ondansetron **OR** ondansetron (ZOFRAN) IV, polyethylene glycol, traMADol  Antimicrobials:  Anti-infectives (From admission, onward)    Start     Dose/Rate Route Frequency Ordered Stop   05/05/22 1000  amoxicillin-clavulanate (AUGMENTIN) 500-125 MG per tablet 1 tablet        1 tablet Oral 2 times daily 05/05/22 0903 05/07/22 0959   05/05/22 1000  doxycycline (VIBRA-TABS) tablet 100 mg        100 mg Oral 2 times daily 05/05/22 [id number] 05/07/22 0959       Data Reviewed: I have personally reviewed following labs and imaging studies  CBC: Recent Labs  Lab 04/30/22 1221 05/01/22 0650 05/05/22 0603  05/06/22 0559  WBC 8.0 9.8 13.6* 7.0  NEUTROABS  --  8.5* 11.2*  --   HGB 11.1* 10.2* 12.3 9.3*  HCT 32.3* 29.6* 35.6* 27.3*  MCV 100.3* 101.0* 99.7 98.9  PLT 201 184 270 [id number]   Basic Metabolic Panel: Recent Labs  Lab 04/30/22 1221 04/30/22 1442 05/01/22 0650 05/05/22 0603 05/06/22 0559  NA 135  --  136 138 139  K 3.1*  --  3.8 3.5 3.2*  CL 103  --  111 107 112*  CO2 23  --  20* 21* 24  GLUCOSE 121*  --  95 109* 85  BUN 31*  --  29* 36* 29*  CREATININE 1.52*  --  0.76 1.13* 0.72  CALCIUM 7.9*  --  7.6* 8.6* 7.9*  MG  --  2.0  --   --   --    GFR: Estimated Creatinine Clearance: 41.4 mL/min (by C-G  formula based on SCr of 0.72 mg/dL). Liver Function Tests: Recent Labs  Lab 04/30/22 1221 05/01/22 0650 05/05/22 0603  AST 64* 54* 89*  ALT 41 32 57*  ALKPHOS 82 66 73  BILITOT 0.6 0.6 1.2  PROT 7.1 5.7* 7.0  ALBUMIN 3.4* 2.6* 3.5   No results for input(s): "LIPASE", "AMYLASE" in the last 168 hours. No results for input(s): "AMMONIA" in the last 168 hours. Coagulation Profile: No results for input(s): "INR", "PROTIME" in the last 168 hours. Cardiac Enzymes: Recent Labs  Lab 05/05/22 0603  CKTOTAL 996*   BNP (last 3 results) No results for input(s): "PROBNP" in the last 8760 hours. HbA1C: No results for input(s): "HGBA1C" in the last 72 hours. CBG: No results for input(s): "GLUCAP" in the last 168 hours. Lipid Profile: No results for input(s): "CHOL", "HDL", "LDLCALC", "TRIG", "CHOLHDL", "LDLDIRECT" in the last 72 hours. Thyroid Function Tests: Recent Labs    05/05/22 0603  TSH 6.266*   Anemia Panel: No results for input(s): "VITAMINB12", "FOLATE", "FERRITIN", "TIBC", "IRON", "RETICCTPCT" in the last 72 hours. Urine analysis:    Component Value Date/Time   COLORURINE YELLOW (A) 05/06/2022 0615   APPEARANCEUR CLOUDY (A) 05/06/2022 0615   APPEARANCEUR Clear 10/31/2017 1420   LABSPEC 1.033 (H) 05/06/2022 0615   LABSPEC 1.012 02/25/2014 1511   PHURINE 5.0 05/06/2022 0615   GLUCOSEU NEGATIVE 05/06/2022 0615   GLUCOSEU Negative 02/25/2014 1511   HGBUR MODERATE (A) 05/06/2022 0615   BILIRUBINUR NEGATIVE 05/06/2022 0615   BILIRUBINUR Negative 04/13/2021 1529   BILIRUBINUR Negative 10/31/2017 1420   BILIRUBINUR Negative 02/25/2014 1511   KETONESUR NEGATIVE 05/06/2022 0615   PROTEINUR 30 (A) 05/06/2022 0615   UROBILINOGEN 1.0 04/13/2021 1529   NITRITE NEGATIVE 05/06/2022 0615   LEUKOCYTESUR SMALL (A) 05/06/2022 0615   LEUKOCYTESUR Negative 02/25/2014 1511   Sepsis Labs: @LABRCNTIP (procalcitonin:4,lacticidven:4)  Recent Results (from the past 240 hour(s))  Resp  Panel by RT-PCR (Flu A&B, Covid) Anterior Nasal Swab     Status: Abnormal   Collection Time: 04/30/22 12:21 PM   Specimen: Anterior Nasal Swab  Result Value Ref Range Status   SARS Coronavirus 2 by RT PCR POSITIVE (A) NEGATIVE Final    Comment: (NOTE) SARS-CoV-2 target nucleic acids are DETECTED.  The SARS-CoV-2 RNA is generally detectable in upper respiratory specimens during the acute phase of infection. Positive results are indicative of the presence of the identified virus, but do not rule out bacterial infection or co-infection with other pathogens not detected by the test. Clinical correlation with patient history and other diagnostic information is necessary  to determine patient infection status. The expected result is Negative.  Fact Sheet for Patients: EntrepreneurPulse.com.au  Fact Sheet for Healthcare Providers: IncredibleEmployment.be  This test is not yet approved or cleared by the [place] FDA and  has been authorized for detection and/or diagnosis of SARS-CoV-2 by FDA under an Emergency Use Authorization (EUA).  This EUA will remain in effect (meaning this test can be used) for the duration of  the COVID-19 declaration under Section 564(b)(1) of the A ct, 21 U.S.C. section 360bbb-3(b)(1), unless the authorization is terminated or revoked sooner.     Influenza A by PCR NEGATIVE NEGATIVE Final   Influenza B by PCR NEGATIVE NEGATIVE Final    Comment: (NOTE) The Xpert Xpress SARS-CoV-2/FLU/RSV plus assay is intended as an aid in the diagnosis of influenza from Nasopharyngeal swab specimens and should not be used as a sole basis for treatment. Nasal washings and aspirates are unacceptable for Xpert Xpress SARS-CoV-2/FLU/RSV testing.  Fact Sheet for Patients: EntrepreneurPulse.com.au  Fact Sheet for Healthcare Providers: IncredibleEmployment.be  This test is not yet approved or cleared by  the [place] FDA and has been authorized for detection and/or diagnosis of SARS-CoV-2 by FDA under an Emergency Use Authorization (EUA). This EUA will remain in effect (meaning this test can be used) for the duration of the COVID-19 declaration under Section 564(b)(1) of the Act, 21 U.S.C. section 360bbb-3(b)(1), unless the authorization is terminated or revoked.  Performed at [facility], [address], [place], [place] [address]          Radiology Studies: CT Hip Right Wo Contrast  Result Date: 05/05/2022 CLINICAL DATA:  Hip fracture suspected EXAM: CT OF THE RIGHT HIP WITHOUT CONTRAST TECHNIQUE: Multidetector CT imaging of the right hip was performed according to the standard protocol. Multiplanar CT image reconstructions were also generated. RADIATION DOSE REDUCTION: This exam was performed according to the departmental dose-optimization program which includes automated exposure control, adjustment of the mA and/or kV according to patient size and/or use of iterative reconstruction technique. COMPARISON:  Pelvis radiography from earlier today FINDINGS: Confirmed branching right acetabular fracture with mild protrusio deformity on coronal reformats. There is a mildly displaced fracture through the inferior pubic ramus on the right. Pronounced osteopenia. Expected hemorrhage in the pelvis adjacent to the fracture. Simple right adnexal cyst measuring 2.7 cm. No follow-up imaging recommended. Note: This recommendation does not apply to premenarchal patients and to those with increased risk (genetic, family history, elevated tumor markers or other high-risk factors) of ovarian cancer. Reference: JACR 2020 Feb; 17(2):248-254 IMPRESSION: 1. Acute, branching fracture through the right superior acetabulum with mild protrusio deformity. 2. Nondisplaced right inferior pubic ramus fracture. 3. Pronounced osteopenia. 4. Expected soft tissue hemorrhage in the deep pelvis. Electronically  Signed   By: Jorje Guild M.D.   On: 05/05/2022 06:31   DG Chest Port 1 View  Result Date: 05/05/2022 CLINICAL DATA:  Fall with cough.  COVID. EXAM: PORTABLE CHEST 1 VIEW COMPARISON:  04/30/2022 FINDINGS: Resolved left lower lobe pneumonia. Right apical opacity which is pleural based scarring and calcification by cervical spine CT. Normal heart size and mediastinal contours. No acute finding. IMPRESSION: Significantly improved aeration at the left base. No new or acute finding. Electronically Signed   By: Jorje Guild M.D.   On: 05/05/2022 05:05   DG Pelvis Portable  Result Date: 05/05/2022 CLINICAL DATA:  Fall.  Unsure which hip is painful EXAM: PORTABLE PELVIS 1-2 VIEWS COMPARISON:  01/09/2022 FINDINGS: Subjective osteopenia. Distorted iliopectineal  line on the right, not seen previously, without discrete fracture line. Irregularity at the inferior right pubic ramus which also appears new and consistent with fracture. Remote fracture of the right lateral iliac wing. Both hips appear located in the proximal femurs appear intact. IMPRESSION: New irregularity of the right acetabulum and obturator ring, recommend CT. Electronically Signed   By: Jorje Guild M.D.   On: 05/05/2022 05:03   CT Head Wo Contrast  Result Date: 05/05/2022 CLINICAL DATA:  Neck trauma.  Weakness and fall.  COVID positive EXAM: CT HEAD WITHOUT CONTRAST CT CERVICAL SPINE WITHOUT CONTRAST TECHNIQUE: Multidetector CT imaging of the head and cervical spine was performed following the standard protocol without intravenous contrast. Multiplanar CT image reconstructions of the cervical spine were also generated. RADIATION DOSE REDUCTION: This exam was performed according to the departmental dose-optimization program which includes automated exposure control, adjustment of the mA and/or kV according to patient size and/or use of iterative reconstruction technique. COMPARISON:  07/21/2016 FINDINGS: CT HEAD FINDINGS Brain: No  evidence of acute infarction, hemorrhage, hydrocephalus, extra-axial collection or mass lesion/mass effect. Moderate brain atrophy and chronic small vessel ischemia. A chronic lacunar infarct has occurred in the right thalamus. Vascular: No hyperdense vessel or unexpected calcification. Skull: Normal. Negative for fracture or focal lesion. Sinuses/Orbits: Partial right mastoid opacification with negative nasopharynx. CT CERVICAL SPINE FINDINGS Alignment: No traumatic malalignment Skull base and vertebrae: No acute fracture.  Subjective osteopenia Soft tissues and spinal canal: No prevertebral fluid or swelling. No visible canal hematoma. Disc levels: Advanced and generalized degeneration without visible cord impingement. C3-4 facet ankylosis on the right where there is bulky spurring encroaching on the foramen. Upper chest: Pleural based scarring with calcification at the right apex. IMPRESSION: 1. No evidence of acute intracranial or cervical spine injury. 2. Right mastoid opacification. 3. Degenerative and senescent changes described above. Electronically Signed   By: Jorje Guild M.D.   On: 05/05/2022 04:51   CT Cervical Spine Wo Contrast  Result Date: 05/05/2022 CLINICAL DATA:  Neck trauma.  Weakness and fall.  COVID positive EXAM: CT HEAD WITHOUT CONTRAST CT CERVICAL SPINE WITHOUT CONTRAST TECHNIQUE: Multidetector CT imaging of the head and cervical spine was performed following the standard protocol without intravenous contrast. Multiplanar CT image reconstructions of the cervical spine were also generated. RADIATION DOSE REDUCTION: This exam was performed according to the departmental dose-optimization program which includes automated exposure control, adjustment of the mA and/or kV according to patient size and/or use of iterative reconstruction technique. COMPARISON:  07/21/2016 FINDINGS: CT HEAD FINDINGS Brain: No evidence of acute infarction, hemorrhage, hydrocephalus, extra-axial collection or  mass lesion/mass effect. Moderate brain atrophy and chronic small vessel ischemia. A chronic lacunar infarct has occurred in the right thalamus. Vascular: No hyperdense vessel or unexpected calcification. Skull: Normal. Negative for fracture or focal lesion. Sinuses/Orbits: Partial right mastoid opacification with negative nasopharynx. CT CERVICAL SPINE FINDINGS Alignment: No traumatic malalignment Skull base and vertebrae: No acute fracture.  Subjective osteopenia Soft tissues and spinal canal: No prevertebral fluid or swelling. No visible canal hematoma. Disc levels: Advanced and generalized degeneration without visible cord impingement. C3-4 facet ankylosis on the right where there is bulky spurring encroaching on the foramen. Upper chest: Pleural based scarring with calcification at the right apex. IMPRESSION: 1. No evidence of acute intracranial or cervical spine injury. 2. Right mastoid opacification. 3. Degenerative and senescent changes described above. Electronically Signed   By: Jorje Guild M.D.   On: 05/05/2022 04:51  LOS: 1 day       Sunnie Nielsen, DO [facility] 05/06/2022, 2:16 PM   Staff may message me via secure chat in Epic  but this may not receive immediate response,  please page for urgent matters!  If 7PM-7AM, please contact night-coverage [email or web address]  Dictation software was used to generate the above note. Typos may occur and escape review, as with typed/written notes. Please contact Dr Lyn Hollingshead directly for clarity if needed.

## 2022-05-06 NOTE — Evaluation (Signed)
Occupational Therapy Evaluation Patient Details Name: Gabriella Rogers MRN: [id number] DOB: Feb 29, 1934 Today's Date: 05/06/2022   History of Present Illness Gabriella Rogers is a 88 y.o. female with medical history significant  for dementia, depression, history of prior stroke, dyslipidemia, recent hospitalization for COVID-19 viral illness with superimposed bacterial pneumonia who returns to the emergency room for evaluation of an unwitnessed fall.  Per ER documentation patient was found on the bathroom floor with complaints of right hip pain.   Clinical Impression   Patient presenting with decreased independence in self-care, functional mobility, safety, cognition, strength, and endurance. Per chart review patient lives at a [facility]F. Patient reports she is independent with ADLs, poor historian and no family available to provide PLOF. Orientation questions asked, patient using dry erase board as memory aide, unable to read clock to determine time of day or determine day of the week. Decreased initiation observed throughout session. Patient currently functioning at max A for bed mobility supine<>sit. Attempted to transfer sit<> stand, 3 reps. Patient with no effort to assist in standing, requiring total A +1. Unable to clear buttocks 3/3 attempts. Patient educated on touch down weightbearing precaution. Patient with c/o right hip pain, scoring it 5/10, requesting pain medication. Nurse informed. Patient left in bed with call bell inreach, bed alarm set, tray set up, and all needs met.  Patient will benefit from acute OT to increase overall independence in the areas of ADLs, functional mobility,  in order to safely discharge to the next venue of care.      Recommendations for follow up therapy are one component of a multi-disciplinary discharge planning process, led by the attending physician.  Recommendations may be updated based on patient status, additional functional criteria and insurance  authorization.   Follow Up Recommendations  Skilled nursing-short term rehab (<3 hours/day)     Assistance Recommended at Discharge Frequent or constant Supervision/Assistance  Patient can return home with the following A lot of help with walking and/or transfers;A lot of help with bathing/dressing/bathroom;Direct supervision/assist for medications management;Assistance with cooking/housework;Help with stairs or ramp for entrance;Direct supervision/assist for financial management;Assist for transportation    Functional Status Assessment  Patient has had a recent decline in their functional status and/or demonstrates limited ability to make significant improvements in function in a reasonable and predictable amount of time  Equipment Recommendations  Other (comment) (Defer to next venue of care.)       Precautions / Restrictions Precautions Precautions: Fall Restrictions Weight Bearing Restrictions: Yes RLE Weight Bearing: Touchdown weight bearing LLE Weight Bearing: Weight bearing as tolerated      Mobility Bed Mobility Overal bed mobility: Needs Assistance Bed Mobility: Supine to Sit, Sit to Supine     Supine to sit: Max assist Sit to supine: Max assist        Transfers Overall transfer level: Needs assistance Equipment used: Rolling walker (2 wheels) Transfers: Sit to/from Stand Sit to Stand: Total assist           General transfer comment: Total A+1, no effort from patient to assist in standing. Unable to clear buttock from bed 3/3 attempts.      Balance Overall balance assessment: Needs assistance Sitting-balance support: No upper extremity supported Sitting balance-Leahy Scale: Fair Sitting balance - Comments: APtient with left lateral lean to provide relief to right hip. Postural control: Left lateral lean     Standing balance comment: patient unable to stand  ADL either performed or assessed with clinical judgement    ADL                                         General ADL Comments: Per patient report patient is independent with ADLs. Patient a poor historian unable to provide accurate PLOF.     Vision Baseline Vision/History: 1 Wears glasses              Pertinent Vitals/Pain Pain Assessment Pain Assessment: Faces Pain Score: 5  Pain Location: right hip Pain Descriptors / Indicators: Aching, Grimacing, Guarding Pain Intervention(s): Patient requesting pain meds-RN notified, Limited activity within patient's tolerance, Monitored during session, Repositioned     Hand Dominance     Extremity/Trunk Assessment Upper Extremity Assessment Upper Extremity Assessment: Generalized weakness   Lower Extremity Assessment Lower Extremity Assessment: Generalized weakness       Communication Communication Communication: No difficulties   Cognition Arousal/Alertness: Awake/alert Behavior During Therapy: WFL for tasks assessed/performed Overall Cognitive Status: Impaired/Different from baseline Area of Impairment: Safety/judgement, Following commands, Problem solving                 Orientation Level: Disoriented to, Situation     Following Commands: Follows one step commands with increased time Safety/Judgement: Decreased awareness of deficits   Problem Solving: Requires verbal cues, Requires tactile cues, Difficulty sequencing, Decreased initiation, Slow processing General Comments: Patient using dry erase board as memory aide, unable to read clock to determine time of day or determine day of the week.                Home Living Family/patient expects to be discharged to:: [place]: Grab bars - tub/shower;Rolling Environmental consultant (2 wheels);Cane - single point;Rollator (4 wheels)          Prior Functioning/Environment Prior Level of Function : Patient poor historian/Family not available;Other  (comment) (Per oatient report, she is independent.)                        OT Problem List: Decreased strength;Pain;Decreased cognition;Decreased safety awareness;Decreased activity tolerance      OT Treatment/Interventions: Self-care/ADL training;Balance training;Therapeutic exercise;Cognitive remediation/compensation;Therapeutic activities    OT Goals(Current goals can be found in the care plan section) Acute Rehab OT Goals Patient Stated Goal: return home OT Goal Formulation: With patient Time For Goal Achievement: 05/20/22 Potential to Achieve Goals: Fair ADL Goals Pt Will Perform Lower Body Bathing: with min assist Pt Will Perform Lower Body Dressing: with min assist Additional ADL Goal #1: Patient will be able verbalize and follow weightbearing precautions with min verbal cues.  OT Frequency: Min 2X/week       AM-PAC OT "6 Clicks" Daily Activity     Outcome Measure Help from another person eating meals?: None Help from another person taking care of personal grooming?: A Little Help from another person toileting, which includes using toliet, bedpan, or urinal?: A Lot Help from another person bathing (including washing, rinsing, drying)?: A Lot Help from another person to put on and taking off regular upper body clothing?: A Little Help from another person to put on and taking off regular lower body clothing?: Total 6 Click Score: 15  End of Session Equipment Utilized During Treatment: Rolling walker (2 wheels) Nurse Communication: Mobility status;Patient requests pain meds  Activity Tolerance: Patient limited by pain Patient left: in bed;with call bell/phone within reach;with bed alarm set  OT Visit Diagnosis: Unsteadiness on feet (R26.81);Other abnormalities of gait and mobility (R26.89);Muscle weakness (generalized) (M62.81);Pain;History of falling (Z91.81) Pain - Right/Left: Right Pain - part of body: Hip                Time: 1351-1415 OT Time Calculation  (min): 24 min Charges:  OT General Charges $OT Visit: 1 Visit OT Evaluation $OT Eval Moderate Complexity: 1 Mod OT Treatments $Therapeutic Activity: 8-22 mins     , OTS 05/06/2022, 3:32 PM 

## 2022-05-06 NOTE — Evaluation (Signed)
Physical Therapy Evaluation Patient Details Name: Gabriella Rogers MRN: [id number] DOB: 09-09-1933 Today's Date: 05/06/2022  History of Present Illness  Gabriella Rogers is a 88 y.o. female with medical history significant  for dementia, depression, history of prior stroke, dyslipidemia, recent hospitalization for COVID-19 viral illness with superimposed bacterial pneumonia who returns to the emergency room for evaluation of an unwitnessed fall.  Per ER documentation patient was found on the bathroom floor with complaints of right hip pain.   Clinical Impression  Pt admitted with above diagnosis. Pt received sitting upright agreeable to PT eval. Due to baseline dementia pt is not a reliable historian. Per EMR pt stand pivot transfers at baseline and is currently living at ALF. Pt unaware of orthopedic impairments denying pain, unsure of situation of why she is in the hospital requiring re-orientation to her fractures or re-education on WB precautions.   To date, pt initiates UE's/LE's to EOB with min multimodal cuing but overall needing maxA to transfer to EOB. Fair sitting tolerance appreciated. PT demo with RW on safe standing techniques to RW with WB precautions with pt already forgetting on her WB precautions. X2 efforts made standing to RW with first attempt pt able to maintain TTWB on RLE standing ~10-15 seconds before requesting seat. On second attempt pt with notable increased WB on RLE and unable to stand > 5 seconds due to breaking WB precautions. Pt endorsing pain and fatigue post x2 attempts. Pt maxA+2 to return to supine and scoot to [facility]. Due to baseline dementia, pain, inability to safely recall and perform WB precautions, pt would benefit STR placement after acute hospitalization to optimize safe healing and functional mobility and strengthening. Pt currently with functional limitations due to the deficits listed below (see PT Problem List). Pt will benefit from skilled PT to increase their  independence and safety with mobility to allow discharge to the venue listed below.     Recommendations for follow up therapy are one component of a multi-disciplinary discharge planning process, led by the attending physician.  Recommendations may be updated based on patient status, additional functional criteria and insurance authorization.  Follow Up Recommendations Skilled nursing-short term rehab (<3 hours/day) Can patient physically be transported by private vehicle: No    Assistance Recommended at Discharge Frequent or constant Supervision/Assistance  Patient can return home with the following  Two people to help with walking and/or transfers;Help with stairs or ramp for entrance;Assist for transportation;Assistance with cooking/housework;Two people to help with bathing/dressing/bathroom;Direct supervision/assist for financial management;Direct supervision/assist for medications management    Equipment Recommendations Other (comment) (tbd by next venue of care)  Recommendations for Other Services       Functional Status Assessment Patient has had a recent decline in their functional status and/or demonstrates limited ability to make significant improvements in function in a reasonable and predictable amount of time     Precautions / Restrictions Precautions Precautions: Fall Restrictions Weight Bearing Restrictions: Yes RLE Weight Bearing: Touchdown weight bearing LLE Weight Bearing: Weight bearing as tolerated      Mobility  Bed Mobility Overal bed mobility: Needs Assistance Bed Mobility: Supine to Sit, Sit to Supine     Supine to sit: HOB elevated, Max assist     General bed mobility comments: Initiates transferring to EOB but ultimately needing maxA Patient Response: Cooperative, Flat affect  Transfers Overall transfer level: Needs assistance Equipment used: Rolling walker (2 wheels) Transfers: Sit to/from Stand Sit to Stand: Max assist, From elevated surface  General transfer comment: maxA to stand x1 with ability to maintain RLE TTWB precautions. Held ~15 seconds. MaxA for second attempt with inability to maintain precautions on RLE due to fatigue and pain.    Ambulation/Gait                  Stairs            Wheelchair Mobility    Modified Rankin (Stroke Patients Only)       Balance Overall balance assessment: Needs assistance Sitting-balance support: No upper extremity supported Sitting balance-Leahy Scale: Fair     Standing balance support: Bilateral upper extremity supported, During functional activity, Reliant on assistive device for balance Standing balance-Leahy Scale: Poor Standing balance comment: significant weight onto LLE and BUE's on RW.                             Pertinent Vitals/Pain Pain Assessment Pain Assessment: Faces Faces Pain Scale: Hurts little more Pain Location: right hip Pain Descriptors / Indicators: Aching, Grimacing, Guarding Pain Intervention(s): Limited activity within patient's tolerance, Monitored during session, Premedicated before session, Repositioned    Home Living Family/patient expects to be discharged to:: Skilled nursing facility                 Home Equipment: Grab bars - tub/shower;Rolling Environmental consultant (2 wheels);Cane - single point;Rollator (4 wheels)      Prior Function Prior Level of Function : Patient poor historian/Family not available             Mobility Comments: Per EMR pt stand pivots at baseline to<>from surfaces       Hand Dominance        Extremity/Trunk Assessment   Upper Extremity Assessment Upper Extremity Assessment: Generalized weakness    Lower Extremity Assessment Lower Extremity Assessment: Generalized weakness;RLE deficits/detail;LLE deficits/detail RLE Deficits / Details: TTWB LLE Deficits / Details: WBAT    Cervical / Trunk Assessment Cervical / Trunk Assessment: Normal  Communication    Communication: No difficulties  Cognition Arousal/Alertness: Awake/alert Behavior During Therapy: WFL for tasks assessed/performed Overall Cognitive Status: History of cognitive impairments - at baseline Area of Impairment: Orientation                 Orientation Level: Disoriented to, Situation     Following Commands: Follows one step commands with increased time Safety/Judgement: Decreased awareness of deficits     General Comments: Was not aware of her current orthopedic impairments; unable to remember precautions ~2 minutes after being educated on them. Pleasant and cooperative throughout.        General Comments      Exercises Other Exercises Other Exercises: Role of PT in acute setting, WB precautions, D/c recs.   Assessment/Plan    PT Assessment Patient needs continued PT services  PT Problem List Decreased strength;Decreased range of motion;Decreased activity tolerance;Decreased balance;Decreased mobility;Decreased cognition;Decreased knowledge of use of DME;Decreased safety awareness;Pain       PT Treatment Interventions DME instruction;Gait training;Functional mobility training;Therapeutic activities;Balance training;Therapeutic exercise;Neuromuscular re-education;Cognitive remediation;Patient/family education;Wheelchair mobility training    PT Goals (Current goals can be found in the Care Plan section)  Acute Rehab PT Goals PT Goal Formulation: Patient unable to participate in goal setting    Frequency 7X/week     Co-evaluation               AM-PAC PT "6 Clicks" Mobility  Outcome Measure Help needed turning from your back to  your side while in a flat bed without using bedrails?: A Lot Help needed moving from lying on your back to sitting on the side of a flat bed without using bedrails?: A Lot Help needed moving to and from a bed to a chair (including a wheelchair)?: Total Help needed standing up from a chair using your arms (e.g., wheelchair or  bedside chair)?: Total Help needed to walk in hospital room?: Total Help needed climbing 3-5 steps with a railing? : Total 6 Click Score: 8    End of Session Equipment Utilized During Treatment: Gait belt Activity Tolerance: Patient limited by pain;Other (comment) (Limited by WB precautions) Patient left: in bed;with bed alarm set;with call bell/phone within reach;with nursing/sitter in room Nurse Communication: Mobility status PT Visit Diagnosis: Muscle weakness (generalized) (M62.81);Difficulty in walking, not elsewhere classified (R26.2);Unsteadiness on feet (R26.81);Pain;History of falling (Z91.81) Pain - Right/Left:  (right and left) Pain - part of body: Hip;Leg    Time: 0867-6195 PT Time Calculation (min) (ACUTE ONLY): 21 min   Charges:   PT Evaluation $PT Eval Moderate Complexity: 1 Mod          Faydra Korman M. Fairly IV, PT, DPT Physical Therapist- [facility]  05/06/2022, 4:08 PM

## 2022-05-06 NOTE — TOC Progression Note (Signed)
Transition of Care [facility]) - Progression Note    Patient Details  Name: Gabriella Rogers MRN: [id number] Date of Birth: 05/08/1934  [facility] CM/SW Contact  [name], RN Phone Number: 05/06/2022, 9:50 AM  Clinical Narrative:   The patient resides at [facility] She went back to [place] [facility] on 11/14 then fell and returned to the ED I spoke with Aya at [facility], she stated that they will need a new FL2 and wants it faxed to [phone] She confirmed that the patient can return with being Covid positive Will call her back with disposition once determined         Expected Discharge Plan and Services                                                 Social Determinants of Health (SDOH) Interventions    Readmission Risk Interventions    05/01/2022    4:23 PM  Readmission Risk Prevention Plan  Transportation Screening Complete  PCP or Specialist Appt within 3-5 Days Complete  HRI or Home Care Consult Complete  Social Work Consult for Recovery Care Planning/Counseling Complete  Palliative Care Screening Not Applicable  Medication Review Oceanographer) Complete

## 2022-05-06 NOTE — NC FL2 (Signed)
[place] MEDICAID FL2 LEVEL OF CARE SCREENING TOOL     IDENTIFICATION  Patient Name: Gabriella Rogers Birthdate: 08/12/1933 Sex: female Admission Date (Current Location): 05/05/2022  [facility] and [place] Number:  Chiropodist and Address:  [facility], [address], [place], [place] [address]      Provider Number: [id number]  Attending Physician Name and Address:  Sunnie Nielsen, DO  Relative Name and Phone Number:  Payton Mccallum [phone]    Current Level of Care: Hospital Recommended Level of Care: Skilled Nursing Facility Prior Approval Number:    Date Approved/Denied:   PASRR Number: [id number]  Discharge Plan: SNF    Current Diagnoses: Patient Active Problem List   Diagnosis Date Noted   Malnutrition of moderate degree 05/06/2022   Closed right acetabular fracture (HCC) 05/05/2022   Closed fracture of right inferior pubic ramus (HCC) 05/05/2022   Unwitnessed fall 05/05/2022   Sinus bradycardia 05/02/2022   COVID-19 04/30/2022   Lobar pneumonia (HCC) 04/30/2022   Dementia without behavioral disturbance (HCC) 04/30/2022   Essential hypertension 04/30/2022   Ear drainage, bilateral    Sensorineural hearing loss (SNHL) of both ears    AKI (acute kidney injury) (HCC) 10/07/2021   Hypokalemia 10/07/2021   Macrocytic anemia 10/07/2021   Leukocytosis 10/07/2021   Generalized weakness 10/07/2021   CVA (cerebral vascular accident) (HCC) 10/07/2021   Urinary tract infection without hematuria 04/13/2021   Acute cystitis without hematuria 04/13/2021   Urethritis 04/13/2021   Painful urination 01/07/2021   Wax in ear 02/26/2020   Acute CVA (cerebrovascular accident) (HCC) 01/26/2020   Depression 11/26/2015   Dizziness 11/26/2015   Hyperlipidemia, unspecified 11/26/2015   Headache, migraine 11/26/2015   Osteoporosis, post-menopausal 11/26/2015   H/O total knee replacement 11/23/2015   Arthropathy, traumatic, knee 03/05/2015    Brain disorder 03/27/2014   Cervical dystonia 01/24/2012    Orientation RESPIRATION BLADDER Height & Weight     Self, Place  Normal External catheter Weight: 51.9 kg Height:  5\' 3"  (160 cm)  BEHAVIORAL SYMPTOMS/MOOD NEUROLOGICAL BOWEL NUTRITION STATUS     (Memory impaired, Poor judgment) Continent Diet (See DC summary)  AMBULATORY STATUS COMMUNICATION OF NEEDS Skin   Extensive Assist Verbally Other (Comment)                       Personal Care Assistance Level of Assistance  Bathing, Feeding, Dressing Bathing Assistance: Limited assistance Feeding assistance: Limited assistance Dressing Assistance: Limited assistance     Functional Limitations Info  Sight, Hearing, Speech Sight Info: Impaired Hearing Info: Impaired Speech Info: Adequate    SPECIAL CARE FACTORS FREQUENCY  PT (By licensed PT), OT (By licensed OT)     PT Frequency: 5 times per week OT Frequency: 5 times per week            Contractures Contractures Info: Not present    Additional Factors Info  Code Status, Allergies Code Status Info: DNR Allergies Info: Actonel  (Risedronate Sodium), Piper, Pneumococcal Vaccine, Risedronate, Alendronate, Aleve (Naproxen Sodium), Pravastatin           Current Medications (05/06/2022):  This is the current hospital active medication list Current Facility-Administered Medications  Medication Dose Route Frequency Provider Last Rate Last Admin   acetaminophen (TYLENOL) tablet 1,000 mg  1,000 mg Oral Q8H 05/08/2022, MD   1,000 mg at 05/06/22 05/08/22   amoxicillin-clavulanate (AUGMENTIN) 500-125 MG per tablet 1 tablet  1 tablet Oral BID 0998, MD  1 tablet at 05/06/22 1009   artificial tears (LACRILUBE) ophthalmic ointment 1 Application  1 Application Both Eyes PRN Agbata, Tochukwu, MD       aspirin EC tablet 81 mg  81 mg Oral Daily Agbata, Tochukwu, MD   81 mg at 05/06/22 1009   atorvastatin (LIPITOR) tablet 80 mg  80 mg Oral q1800 Agbata, Tochukwu,  MD   80 mg at 05/05/22 2148   clopidogrel (PLAVIX) tablet 75 mg  75 mg Oral Daily Agbata, Tochukwu, MD   75 mg at 05/06/22 1009   doxycycline (VIBRA-TABS) tablet 100 mg  100 mg Oral BID Agbata, Tochukwu, MD   100 mg at 05/06/22 1009   famotidine (PEPCID) tablet 20 mg  20 mg Oral Daily Agbata, Tochukwu, MD   20 mg at 05/06/22 1009   feeding supplement (ENSURE ENLIVE / ENSURE PLUS) liquid 237 mL  237 mL Oral BID BM Agbata, Tochukwu, MD   237 mL at 05/06/22 1010   multivitamin with minerals tablet 1 tablet  1 tablet Oral Daily Agbata, Tochukwu, MD       ondansetron (ZOFRAN) tablet 4 mg  4 mg Oral Q6H PRN Agbata, Tochukwu, MD       Or   ondansetron (ZOFRAN) injection 4 mg  4 mg Intravenous Q6H PRN Agbata, Tochukwu, MD       PARoxetine (PAXIL) tablet 10 mg  10 mg Oral Daily Agbata, Tochukwu, MD   10 mg at 05/06/22 1009   polyethylene glycol (MIRALAX / GLYCOLAX) packet 17 g  17 g Oral Daily PRN Agbata, Tochukwu, MD       potassium chloride SA (KLOR-CON M) CR tablet 40 mEq  40 mEq Oral BID Sunnie Nielsen, DO   40 mEq at 05/06/22 1009   traMADol (ULTRAM) tablet 50 mg  50 mg Oral Q6H PRN Irean Hong, MD         Discharge Medications: Please see discharge summary for a list of discharge medications.  Relevant Imaging Results:  Relevant Lab Results:   Additional Information SSN: [id number]  [name], RN

## 2022-05-06 NOTE — Hospital Course (Addendum)
Gabriella Rogers is a 88 y.o. female with medical history significant  for dementia, depression, history of prior stroke, dyslipidemia, recent hospitalization for COVID-19 viral illness with superimposed bacterial pneumonia who returns to the emergency room for evaluation of an unwitnessed fall.  Per ER documentation patient was found on the bathroom floor by staff at [facility] with complaints of right hip pain. Patient states that she thinks the floor was wet and she tripped and fell.  11/15: Pelvic x-ray shows new irregularity of the right acetabulum and obturator ring. CT of the right hip without contrast shows acute, branching fracture through the right superior acetabulum with mild protrusion deformity. Nondisplaced right inferior pubic ramus fracture. Pronounced osteopenia. Expected soft tissue hemorrhage in the deep pelvis. Admitting hospitalist notes that daughter requests nonsurgical intervention, she also confirms pt is nonambulatory but can typically transfer bed/wheelchair. Dementia limits ability to perform complete ROS / obtain much history from the patient. Pt admitted for pain control and orthopedic consult, treat AKI, eval possible causes syncope  11/16: AKI resolved, VSS. Ortho clear for discharge from their standpoint. PT/OT eval pending. TOC following, suspect will need SNF for long term care. TOC following 11/17: awaiting SNF placement. Labs WNL  Consultants:  Orthopedic Surgery   Procedures: none      ASSESSMENT & PLAN:   Principal Problem:   Unwitnessed fall Active Problems:   COVID-19   Closed right acetabular fracture (HCC)   Closed fracture of right inferior pubic ramus (HCC)   AKI (acute kidney injury) (HCC)   CVA (cerebral vascular accident) (HCC)   Dementia without behavioral disturbance (HCC)   Essential hypertension   Sinus bradycardia   Malnutrition of moderate degree  Closed right acetabular fracture (HCC) Status post fall with a  closed right acetabular fracture Nonsurgical management if possible Pain control with scheduled acetaminophen and as needed tramadol per Ortho recommendation Fall precautions Orthopedic surgery consult PT/OT Pt may need SNF for long term care   Closed fracture of right inferior pubic ramus (HCC) Status post fall with closed fracture of right inferior pubic ramus Nonsurgical management if possible Pain control with scheduled acetaminophen and as needed tramadol per Ortho recommendation Fall precautions Orthopedic surgery consult PT/OT Pt may need SNF for long term care   Sinus bradycardia Patient noted to be bradycardic on the monitor She is started post an unwitnessed fall and it is unclear if she had a syncopal episode. Donepezil was discontinued during her last hospitalization due to sinus bradycardia Obtain TSH Patient's history is limited due to her underlying dementia  AKI (acute kidney injury) (HCC) - resolved Most likely related to diuretic therapy Patient has a baseline serum creatinine of 0.7 and on admission it is 1.13 Judicious IV fluid resuscitation Hold HCTZ Repeat renal parameter is in a.m. --> improved   COVID-19 Lobar pneumonia - improved on CXR Recent hospitalization for COVID-19 viral illness w/ lobar pneumonia Patient will remain on isolation Finishing abx for pneumonia   Hypokalmia - resolved Repleted Monitor periodic BMP  Unwitnessed fall Patient was brought into the ER for evaluation of unwitnessed fall with right-sided hip pain and imaging shows a right superior acetabulum fracture with mild protrusio deformity and non displaced right inferior pubic ramus fracture. We will place patient on fall precautions Orthopedic surgery consult PT/OT  Essential hypertension Hold hydrochlorothiazide due to relative hypotension and AKI  Dementia without behavioral disturbance (HCC) Continue Paxil  Hx CVA (cerebral vascular accident) (HCC) History of  prior stroke on  dual antiplatelet therapy and atorvastatin Hold aspirin and Plavix for now due to CT scan findings suggestive of expected soft tissue hemorrhage in the deep pelvis --> this was restarted by admitting hospitalist after consultation w/ Dr. Audelia Acton  c continue atorvastatin   DVT prophylaxis: on ASA/Plavix, adding Lovenox now that Hgb seems to have normalized  Pertinent IV fluids/nutrition: no continuous fluids Central lines / invasive devices: none  Code Status: DNR Family Communication: spoke w/ daughter yesterday on the phone - will call with any updates   Disposition: inpatient  TOC needs: will need SNF for rehab / long term care  Barriers to discharge / significant pending items: awaiting SNF

## 2022-05-07 DIAGNOSIS — R296 Repeated falls: Secondary | ICD-10-CM | POA: Diagnosis not present

## 2022-05-07 DIAGNOSIS — I639 Cerebral infarction, unspecified: Secondary | ICD-10-CM | POA: Diagnosis not present

## 2022-05-07 DIAGNOSIS — S32591A Other specified fracture of right pubis, initial encounter for closed fracture: Secondary | ICD-10-CM | POA: Diagnosis not present

## 2022-05-07 DIAGNOSIS — S32481A Displaced dome fracture of right acetabulum, initial encounter for closed fracture: Secondary | ICD-10-CM | POA: Diagnosis not present

## 2022-05-07 LAB — CBC
HCT: 32.2 % — ABNORMAL LOW (ref 36.0–46.0)
Hemoglobin: 11 g/dL — ABNORMAL LOW (ref 12.0–15.0)
MCH: 34.2 pg — ABNORMAL HIGH (ref 26.0–34.0)
MCHC: 34.2 g/dL (ref 30.0–36.0)
MCV: 100 fL (ref 80.0–100.0)
Platelets: 264 10*3/uL (ref 150–400)
RBC: 3.22 MIL/uL — ABNORMAL LOW (ref 3.87–5.11)
RDW: 14.2 % (ref 11.5–15.5)
WBC: 10.2 10*3/uL (ref 4.0–10.5)
nRBC: 0 % (ref 0.0–0.2)

## 2022-05-07 LAB — BASIC METABOLIC PANEL
Anion gap: 5 (ref 5–15)
BUN: 16 mg/dL (ref 8–23)
CO2: 23 mmol/L (ref 22–32)
Calcium: 8.2 mg/dL — ABNORMAL LOW (ref 8.9–10.3)
Chloride: 110 mmol/L (ref 98–111)
Creatinine, Ser: 0.62 mg/dL (ref 0.44–1.00)
GFR, Estimated: >60 mL/min (ref >60)
Glucose, Bld: 92 mg/dL (ref 70–99)
Potassium: 4.5 mmol/L (ref 3.5–5.1)
Sodium: 138 mmol/L (ref 135–145)

## 2022-05-07 NOTE — Care Management Important Message (Signed)
Important Message  Patient Details  Name: Gabriella Rogers MRN: [id number] Date of Birth: [birth year]   Medicare Important Message Given:  N/A - LOS <3 / Initial given by admissions     Olegario Messier A Kiyon Fidalgo 05/07/2022, 8:18 AM

## 2022-05-07 NOTE — Plan of Care (Signed)

## 2022-05-07 NOTE — TOC Progression Note (Signed)
Transition of Care [facility]) - Progression Note    Patient Details  Name: Gabriella Rogers MRN: [id number] Date of Birth: 03-07-1934  Transition of Care [facility]) CM/SW Contact  [name], RN Phone Number: 05/07/2022, 9:05 AM  Clinical Narrative:     Reached out to facilities to confirm that they knew she was going to transition to Long Term care after [facility] confirmed they were aware, [facility] is checking again and [facility] is still pending a response        Expected Discharge Plan and Services                                                 Social Determinants of Health (SDOH) Interventions    Readmission Risk Interventions    05/01/2022    4:23 PM  Readmission Risk Prevention Plan  Transportation Screening Complete  PCP or Specialist Appt within 3-5 Days Complete  HRI or Home Care Consult Complete  Social Work Consult for Recovery Care Planning/Counseling Complete  Palliative Care Screening Not Applicable  Medication Review Oceanographer) Complete

## 2022-05-07 NOTE — Progress Notes (Signed)
Physical Therapy Treatment Patient Details Name: Gabriella Rogers MRN: [id number] DOB: [birth year] Today's Date: 05/07/2022   History of Present Illness Gabriella Rogers is a 88 y.o. female with medical history significant  for dementia, depression, history of prior stroke, dyslipidemia, recent hospitalization for COVID-19 viral illness with superimposed bacterial pneumonia who returns to the emergency room for evaluation of an unwitnessed fall.  Per ER documentation patient was found on the bathroom floor with complaints of right hip pain.    PT Comments    Pt received upright in bed beginning to eat breakfast. Pt agreeable to participate with encouragement and education on expectations of mobility based off of her orthopedic impairments. Pt required re-ed on WB precautions with no remembrance from yesterday due to baseline cognitive deficits. Pt Limited participation in LE therex due to pain mobilizing RLE overall resistive to exercises due to guarding.  Pt able to assist some in bed mobility this date brining LE's to EOB and using UE's on bed rail but still needing modA to transfer. Pt standing modA to RW maintaining RLE TTWB but with step pivot does demo increased WB on RLE but still partial with heavy BUE and LLE WB. Mod Multi modal cuing, laborious, and increased time to transfer to recliner. Pt with all needs in reach, LE's elevated, re-oriented to call bell for assist from nursing. Pt progressing in mobility but still rec STR at d/c due to poor safety awareness, understanding of precautions, and weakness.    Recommendations for follow up therapy are one component of a multi-disciplinary discharge planning process, led by the attending physician.  Recommendations may be updated based on patient status, additional functional criteria and insurance authorization.  Follow Up Recommendations  Skilled nursing-short term rehab (<3 hours/day) Can patient physically be transported by private vehicle:  No   Assistance Recommended at Discharge Frequent or constant Supervision/Assistance  Patient can return home with the following Two people to help with walking and/or transfers;Help with stairs or ramp for entrance;Assist for transportation;Assistance with cooking/housework;Two people to help with bathing/dressing/bathroom;Direct supervision/assist for financial management;Direct supervision/assist for medications management   Equipment Recommendations  Other (comment) (next venue of care)    Recommendations for Other Services       Precautions / Restrictions Precautions Precautions: Fall Precaution Comments: Per orthopedic, due to baseline dementia, okay to perform limited WB on RLE if can not follow TTWB. Restrictions Weight Bearing Restrictions: Yes RLE Weight Bearing: Touchdown weight bearing (Simultaneous filing. User may not have seen previous data.) LLE Weight Bearing: Weight bearing as tolerated (Simultaneous filing. User may not have seen previous data.)     Mobility  Bed Mobility Overal bed mobility: Needs Assistance Bed Mobility: Supine to Sit     Supine to sit: Mod assist, HOB elevated     General bed mobility comments: Improvement in UE/LE obiltiy to EOB. Still needs modA to complete. Patient Response: Cooperative, Flat affect  Transfers Overall transfer level: Needs assistance Equipment used: Rolling walker (2 wheels) Transfers: Bed to chair/wheelchair/BSC Sit to Stand: Mod assist, From elevated surface   Step pivot transfers: Mod assist, From elevated surface       General transfer comment: modA to stand. In standing pt able to maintain TTWB on RLE. With step pivot pt requires increased time, increased WB on RLE but appears still partial, maintains most weight on her LLE.    Ambulation/Gait               General Gait Details: deferred  Stairs             Wheelchair Mobility    Modified Rankin (Stroke Patients Only)        Balance Overall balance assessment: Needs assistance Sitting-balance support: No upper extremity supported Sitting balance-Leahy Scale: Fair     Standing balance support: Bilateral upper extremity supported, During functional activity, Reliant on assistive device for balance Standing balance-Leahy Scale: Poor Standing balance comment: significant weight onto LLE and BUE's on RW.                            Cognition Arousal/Alertness: Awake/alert Behavior During Therapy: WFL for tasks assessed/performed Overall Cognitive Status: History of cognitive impairments - at baseline                                 General Comments: Does not remember precautions. Requires encouragement and education to participate and expectations of mobility based off of her current fractures.        Exercises General Exercises - Lower Extremity Ankle Circles/Pumps: AROM, Strengthening, Both, 15 reps, Supine Hip ABduction/ADduction: AAROM, Right, 5 reps, Supine Other Exercises Other Exercises: Re-ed on WB precautions, expectations with therapy due to her orthopedic impairments.    General Comments        Pertinent Vitals/Pain Pain Assessment Pain Assessment: Faces Faces Pain Scale: Hurts little more Pain Location: right hip Pain Descriptors / Indicators: Aching, Grimacing, Guarding Pain Intervention(s): Limited activity within patient's tolerance, Monitored during session, Repositioned    Home Living                          Prior Function            PT Goals (current goals can now be found in the care plan section) Acute Rehab PT Goals PT Goal Formulation: Patient unable to participate in goal setting    Frequency    7X/week      PT Plan Current plan remains appropriate    Co-evaluation              AM-PAC PT "6 Clicks" Mobility   Outcome Measure  Help needed turning from your back to your side while in a flat bed without using  bedrails?: A Lot Help needed moving from lying on your back to sitting on the side of a flat bed without using bedrails?: A Lot Help needed moving to and from a bed to a chair (including a wheelchair)?: A Lot Help needed standing up from a chair using your arms (e.g., wheelchair or bedside chair)?: A Lot Help needed to walk in hospital room?: Total Help needed climbing 3-5 steps with a railing? : Total 6 Click Score: 10    End of Session Equipment Utilized During Treatment: Gait belt Activity Tolerance: Patient tolerated treatment well;Patient limited by pain Patient left: in chair;with call bell/phone within reach;with chair alarm set Nurse Communication: Mobility status PT Visit Diagnosis: Muscle weakness (generalized) (M62.81);Difficulty in walking, not elsewhere classified (R26.2);Unsteadiness on feet (R26.81);Pain;History of falling (Z91.81) Pain - Right/Left: Left Pain - part of body: Hip;Leg     Time: 6295-2841 PT Time Calculation (min) (ACUTE ONLY): 23 min  Charges:  $Therapeutic Activity: 23-37 mins                    Corie Allis M. Fairly IV, PT, DPT Physical Therapist- [facility]  05/07/2022, 10:24 AM

## 2022-05-07 NOTE — TOC Progression Note (Signed)
Transition of Care [facility]) - Progression Note    Patient Details  Name: TAGEN BRETHAUER MRN: [id number] Date of Birth: 09/15/1933  [facility]) CM/SW Contact  [name], RN Phone Number: 05/07/2022, 2:51 PM  Clinical Narrative:     Butler Denmark her daughter and POA, we reviewed the bed offers and confirmed that she will transition over to LTC after STR She chose [facility] and stated that hte patient had been there a couple of times, I explained we would submit for Ins approval for STR       Expected Discharge Plan and Services                                                 Social Determinants of Health (SDOH) Interventions    Readmission Risk Interventions    05/01/2022    4:23 PM  Readmission Risk Prevention Plan  Transportation Screening Complete  PCP or Specialist Appt within 3-5 Days Complete  HRI or Home Care Consult Complete  Social Work Consult for Recovery Care Planning/Counseling Complete  Palliative Care Screening Not Applicable  Medication Review Oceanographer) Complete

## 2022-05-07 NOTE — Plan of Care (Signed)
  Problem: Pain Managment: Goal: General experience of comfort will improve Outcome: Progressing   Problem: Safety: Goal: Ability to remain free from injury will improve Outcome: Progressing   Problem: Skin Integrity: Goal: Risk for impaired skin integrity will decrease Outcome: Progressing   

## 2022-05-07 NOTE — Progress Notes (Signed)
PROGRESS NOTE    Gabriella Rogers   [id number] DOB: 05-31-1934  DOA: 05/05/2022 Date of Service: 05/07/22 PCP: Cletis Athens, MD     Brief Narrative / Hospital Course:  Gabriella Rogers is a 88 y.o. female with medical history significant  for dementia, depression, history of prior stroke, dyslipidemia, recent hospitalization for COVID-19 viral illness with superimposed bacterial pneumonia who returns to the emergency room for evaluation of an unwitnessed fall.  Per ER documentation patient was found on the bathroom floor by staff at [facility] with complaints of right hip pain. Patient states that she thinks the floor was wet and she tripped and fell.  11/15: Pelvic x-ray shows new irregularity of the right acetabulum and obturator ring. CT of the right hip without contrast shows acute, branching fracture through the right superior acetabulum with mild protrusion deformity. Nondisplaced right inferior pubic ramus fracture. Pronounced osteopenia. Expected soft tissue hemorrhage in the deep pelvis. Admitting hospitalist notes that daughter requests nonsurgical intervention, she also confirms pt is nonambulatory but can typically transfer bed/wheelchair. Dementia limits ability to perform complete ROS / obtain much history from the patient. Pt admitted for pain control and orthopedic consult, treat AKI, eval possible causes syncope  11/16: AKI resolved, VSS. Ortho clear for discharge from their standpoint. PT/OT eval pending. TOC following, suspect will need SNF for long term care. TOC following 11/17: awaiting SNF placement. Labs WNL  Consultants:  Orthopedic Surgery   Procedures: none      ASSESSMENT & PLAN:   Principal Problem:   Unwitnessed fall Active Problems:   COVID-19   Closed right acetabular fracture (HCC)   Closed fracture of right inferior pubic ramus (HCC)   AKI (acute kidney injury) ([place])   CVA (cerebral vascular accident) ([place])   Dementia  without behavioral disturbance ([place])   Essential hypertension   Sinus bradycardia   Malnutrition of moderate degree  Closed right acetabular fracture (HCC) Status post fall with a closed right acetabular fracture Nonsurgical management if possible Pain control with scheduled acetaminophen and as needed tramadol per Ortho recommendation Fall precautions Orthopedic surgery consult PT/OT Pt may need SNF for long term care   Closed fracture of right inferior pubic ramus ([place]) Status post fall with closed fracture of right inferior pubic ramus Nonsurgical management if possible Pain control with scheduled acetaminophen and as needed tramadol per Ortho recommendation Fall precautions Orthopedic surgery consult PT/OT Pt may need SNF for long term care   Sinus bradycardia Patient noted to be bradycardic on the monitor She is started post an unwitnessed fall and it is unclear if she had a syncopal episode. Donepezil was discontinued during her last hospitalization due to sinus bradycardia Obtain TSH Patient's history is limited due to her underlying dementia  AKI (acute kidney injury) ([place]) - resolved Most likely related to diuretic therapy Patient has a baseline serum creatinine of 0.7 and on admission it is 1.13 Judicious IV fluid resuscitation Hold HCTZ Repeat renal parameter is in a.m. --> improved   COVID-19 Lobar pneumonia - improved on CXR Recent hospitalization for COVID-19 viral illness w/ lobar pneumonia Patient will remain on isolation Finishing abx for pneumonia   Hypokalmia - resolved Repleted Monitor periodic BMP  Unwitnessed fall Patient was brought into the ER for evaluation of unwitnessed fall with right-sided hip pain and imaging shows a right superior acetabulum fracture with mild protrusio deformity and non displaced right inferior pubic ramus fracture. We will place patient on fall precautions Orthopedic surgery  consult PT/OT  Essential  hypertension Hold hydrochlorothiazide due to relative hypotension and AKI  Dementia without behavioral disturbance (HCC) Continue Paxil  Hx CVA (cerebral vascular accident) (HCC) History of prior stroke on dual antiplatelet therapy and atorvastatin Hold aspirin and Plavix for now due to CT scan findings suggestive of expected soft tissue hemorrhage in the deep pelvis --> this was restarted by admitting hospitalist after consultation w/ Dr. Audelia Acton  c continue atorvastatin   DVT prophylaxis: on ASA/Plavix, adding Lovenox now that Hgb seems to have normalized  Pertinent IV fluids/nutrition: no continuous fluids Central lines / invasive devices: none  Code Status: DNR Family Communication: spoke w/ daughter yesterday on the phone - will call with any updates   Disposition: inpatient  TOC needs: will need SNF for rehab / long term care  Barriers to discharge / significant pending items: awaiting SNF              Subjective:  Patient reports pain is controlled, no CP/SOB, no other complaints or concerns        Objective:  Vitals:   05/06/22 2207 05/06/22 2339 05/07/22 0853 05/07/22 0946  BP: 127/62 (!) 142/69 (!) 168/88 (!) 155/75  Pulse: (!) 59 63 82 68  Resp: 18 14 16    Temp: 98.2 F (36.8 C) 98.3 F (36.8 C) 98.3 F (36.8 C)   TempSrc:      SpO2: 100% 100% 99%   Weight:      Height:        Intake/Output Summary (Last 24 hours) at 05/07/2022 1313 Last data filed at 05/07/2022 0500 Gross per 24 hour  Intake --  Output 450 ml  Net -450 ml   Filed Weights   05/05/22 0515  Weight: 51.9 kg    Examination:  Constitutional:  VS as above General Appearance: alert, well-developed, well-nourished, NAD Respiratory: Normal respiratory effort CTAB Cardiovascular: S1/S2 normal RRR No rub/gallop auscultated No lower extremity edema Gastrointestinal: No tenderness No masses No hernia appreciated Musculoskeletal:  No clubbing/cyanosis of  digits Symmetrical movement in all extremities Neurological: No cranial nerve deficit on limited exam Alert - oriented to person, place [facility]), year, disoriented to time (says Sept)   Psychiatric: Fair judgment/insight Normal mood and affect       Scheduled Medications:   acetaminophen  1,000 mg Oral Q8H   aspirin EC  81 mg Oral Daily   atorvastatin  80 mg Oral q1800   clopidogrel  75 mg Oral Daily   famotidine  20 mg Oral Daily   feeding supplement  237 mL Oral BID BM   multivitamin with minerals  1 tablet Oral Daily   PARoxetine  10 mg Oral Daily    Continuous Infusions:   PRN Medications:  artificial tears, ondansetron **OR** ondansetron (ZOFRAN) IV, polyethylene glycol, traMADol  Antimicrobials:  Anti-infectives (From admission, onward)    Start     Dose/Rate Route Frequency Ordered Stop   05/05/22 1000  amoxicillin-clavulanate (AUGMENTIN) 500-125 MG per tablet 1 tablet        1 tablet Oral 2 times daily 05/05/22 0903 05/07/22 0959   05/05/22 1000  doxycycline (VIBRA-TABS) tablet 100 mg        100 mg Oral 2 times daily 05/05/22 0903 05/06/22 2243       Data Reviewed: I have personally reviewed following labs and imaging studies  CBC: Recent Labs  Lab 05/01/22 0650 05/05/22 0603 05/06/22 0559 05/07/22 0940  WBC 9.8 13.6* 7.0 10.2  NEUTROABS 8.5* 11.2*  --   --  HGB 10.2* 12.3 9.3* 11.0*  HCT 29.6* 35.6* 27.3* 32.2*  MCV 101.0* 99.7 98.9 100.0  PLT 184 270 216 [id number]   Basic Metabolic Panel: Recent Labs  Lab 04/30/22 1442 05/01/22 0650 05/05/22 0603 05/06/22 0559 05/07/22 0940  NA  --  136 138 139 138  K  --  3.8 3.5 3.2* 4.5  CL  --  111 107 112* 110  CO2  --  20* 21* 24 23  GLUCOSE  --  95 109* 85 92  BUN  --  29* 36* 29* 16  CREATININE  --  0.76 1.13* 0.72 0.62  CALCIUM  --  7.6* 8.6* 7.9* 8.2*  MG 2.0  --   --   --   --    GFR: Estimated Creatinine Clearance: 41.4 mL/min (by C-G formula based on SCr of 0.62 mg/dL). Liver  Function Tests: Recent Labs  Lab 05/01/22 0650 05/05/22 0603  AST 54* 89*  ALT 32 57*  ALKPHOS 66 73  BILITOT 0.6 1.2  PROT 5.7* 7.0  ALBUMIN 2.6* 3.5   No results for input(s): "LIPASE", "AMYLASE" in the last 168 hours. No results for input(s): "AMMONIA" in the last 168 hours. Coagulation Profile: No results for input(s): "INR", "PROTIME" in the last 168 hours. Cardiac Enzymes: Recent Labs  Lab 05/05/22 0603  CKTOTAL 996*   BNP (last 3 results) No results for input(s): "PROBNP" in the last 8760 hours. HbA1C: No results for input(s): "HGBA1C" in the last 72 hours. CBG: No results for input(s): "GLUCAP" in the last 168 hours. Lipid Profile: No results for input(s): "CHOL", "HDL", "LDLCALC", "TRIG", "CHOLHDL", "LDLDIRECT" in the last 72 hours. Thyroid Function Tests: Recent Labs    05/05/22 0603  TSH 6.266*   Anemia Panel: No results for input(s): "VITAMINB12", "FOLATE", "FERRITIN", "TIBC", "IRON", "RETICCTPCT" in the last 72 hours. Urine analysis:    Component Value Date/Time   COLORURINE YELLOW (A) 05/06/2022 0615   APPEARANCEUR CLOUDY (A) 05/06/2022 0615   APPEARANCEUR Clear 10/31/2017 1420   LABSPEC 1.033 (H) 05/06/2022 0615   LABSPEC 1.012 02/25/2014 1511   PHURINE 5.0 05/06/2022 0615   GLUCOSEU NEGATIVE 05/06/2022 0615   GLUCOSEU Negative 02/25/2014 1511   HGBUR MODERATE (A) 05/06/2022 0615   BILIRUBINUR NEGATIVE 05/06/2022 0615   BILIRUBINUR Negative 04/13/2021 1529   BILIRUBINUR Negative 10/31/2017 1420   BILIRUBINUR Negative 02/25/2014 1511   KETONESUR NEGATIVE 05/06/2022 0615   PROTEINUR 30 (A) 05/06/2022 0615   UROBILINOGEN 1.0 04/13/2021 1529   NITRITE NEGATIVE 05/06/2022 0615   LEUKOCYTESUR SMALL (A) 05/06/2022 0615   LEUKOCYTESUR Negative 02/25/2014 1511   Sepsis Labs: @LABRCNTIP (procalcitonin:4,lacticidven:4)  Recent Results (from the past 240 hour(s))  Resp Panel by RT-PCR (Flu A&B, Covid) Anterior Nasal Swab     Status: Abnormal    Collection Time: 04/30/22 12:21 PM   Specimen: Anterior Nasal Swab  Result Value Ref Range Status   SARS Coronavirus 2 by RT PCR POSITIVE (A) NEGATIVE Final    Comment: (NOTE) SARS-CoV-2 target nucleic acids are DETECTED.  The SARS-CoV-2 RNA is generally detectable in upper respiratory specimens during the acute phase of infection. Positive results are indicative of the presence of the identified virus, but do not rule out bacterial infection or co-infection with other pathogens not detected by the test. Clinical correlation with patient history and other diagnostic information is necessary to determine patient infection status. The expected result is Negative.  Fact Sheet for Patients: EntrepreneurPulse.com.au  Fact Sheet for Healthcare Providers: IncredibleEmployment.be  This test is  not yet approved or cleared by the [place] and  has been authorized for detection and/or diagnosis of SARS-CoV-2 by FDA under an Emergency Use Authorization (EUA).  This EUA will remain in effect (meaning this test can be used) for the duration of  the COVID-19 declaration under Section 564(b)(1) of the A ct, 21 U.S.C. section 360bbb-3(b)(1), unless the authorization is terminated or revoked sooner.     Influenza A by PCR NEGATIVE NEGATIVE Final   Influenza B by PCR NEGATIVE NEGATIVE Final    Comment: (NOTE) The Xpert Xpress SARS-CoV-2/FLU/RSV plus assay is intended as an aid in the diagnosis of influenza from Nasopharyngeal swab specimens and should not be used as a sole basis for treatment. Nasal washings and aspirates are unacceptable for Xpert Xpress SARS-CoV-2/FLU/RSV testing.  Fact Sheet for Patients: BloggerCourse.com  Fact Sheet for Healthcare Providers: SeriousBroker.it  This test is not yet approved or cleared by the [place] FDA and has been authorized for detection and/or  diagnosis of SARS-CoV-2 by FDA under an Emergency Use Authorization (EUA). This EUA will remain in effect (meaning this test can be used) for the duration of the COVID-19 declaration under Section 564(b)(1) of the Act, 21 U.S.C. section 360bbb-3(b)(1), unless the authorization is terminated or revoked.  Performed at [facility], [address] [place], [place] [address]          Radiology Studies: CT Hip Right Wo Contrast  Result Date: 05/05/2022 CLINICAL DATA:  Hip fracture suspected EXAM: CT OF THE RIGHT HIP WITHOUT CONTRAST TECHNIQUE: Multidetector CT imaging of the right hip was performed according to the standard protocol. Multiplanar CT image reconstructions were also generated. RADIATION DOSE REDUCTION: This exam was performed according to the departmental dose-optimization program which includes automated exposure control, adjustment of the mA and/or kV according to patient size and/or use of iterative reconstruction technique. COMPARISON:  Pelvis radiography from earlier today FINDINGS: Confirmed branching right acetabular fracture with mild protrusio deformity on coronal reformats. There is a mildly displaced fracture through the inferior pubic ramus on the right. Pronounced osteopenia. Expected hemorrhage in the pelvis adjacent to the fracture. Simple right adnexal cyst measuring 2.7 cm. No follow-up imaging recommended. Note: This recommendation does not apply to premenarchal patients and to those with increased risk (genetic, family history, elevated tumor markers or other high-risk factors) of ovarian cancer. Reference: JACR 2020 Feb; 17(2):248-254 IMPRESSION: 1. Acute, branching fracture through the right superior acetabulum with mild protrusio deformity. 2. Nondisplaced right inferior pubic ramus fracture. 3. Pronounced osteopenia. 4. Expected soft tissue hemorrhage in the deep pelvis. Electronically Signed   By: Tiburcio Pea M.D.   On: 05/05/2022 06:31   DG Chest  Port 1 View  Result Date: 05/05/2022 CLINICAL DATA:  Fall with cough.  COVID. EXAM: PORTABLE CHEST 1 VIEW COMPARISON:  04/30/2022 FINDINGS: Resolved left lower lobe pneumonia. Right apical opacity which is pleural based scarring and calcification by cervical spine CT. Normal heart size and mediastinal contours. No acute finding. IMPRESSION: Significantly improved aeration at the left base. No new or acute finding. Electronically Signed   By: Tiburcio Pea M.D.   On: 05/05/2022 05:05   DG Pelvis Portable  Result Date: 05/05/2022 CLINICAL DATA:  Fall.  Unsure which hip is painful EXAM: PORTABLE PELVIS 1-2 VIEWS COMPARISON:  01/09/2022 FINDINGS: Subjective osteopenia. Distorted iliopectineal line on the right, not seen previously, without discrete fracture line. Irregularity at the inferior right pubic ramus which also appears new and consistent with fracture. Remote  fracture of the right lateral iliac wing. Both hips appear located in the proximal femurs appear intact. IMPRESSION: New irregularity of the right acetabulum and obturator ring, recommend CT. Electronically Signed   By: Jorje Guild M.D.   On: 05/05/2022 05:03   CT Head Wo Contrast  Result Date: 05/05/2022 CLINICAL DATA:  Neck trauma.  Weakness and fall.  COVID positive EXAM: CT HEAD WITHOUT CONTRAST CT CERVICAL SPINE WITHOUT CONTRAST TECHNIQUE: Multidetector CT imaging of the head and cervical spine was performed following the standard protocol without intravenous contrast. Multiplanar CT image reconstructions of the cervical spine were also generated. RADIATION DOSE REDUCTION: This exam was performed according to the departmental dose-optimization program which includes automated exposure control, adjustment of the mA and/or kV according to patient size and/or use of iterative reconstruction technique. COMPARISON:  07/21/2016 FINDINGS: CT HEAD FINDINGS Brain: No evidence of acute infarction, hemorrhage, hydrocephalus, extra-axial  collection or mass lesion/mass effect. Moderate brain atrophy and chronic small vessel ischemia. A chronic lacunar infarct has occurred in the right thalamus. Vascular: No hyperdense vessel or unexpected calcification. Skull: Normal. Negative for fracture or focal lesion. Sinuses/Orbits: Partial right mastoid opacification with negative nasopharynx. CT CERVICAL SPINE FINDINGS Alignment: No traumatic malalignment Skull base and vertebrae: No acute fracture.  Subjective osteopenia Soft tissues and spinal canal: No prevertebral fluid or swelling. No visible canal hematoma. Disc levels: Advanced and generalized degeneration without visible cord impingement. C3-4 facet ankylosis on the right where there is bulky spurring encroaching on the foramen. Upper chest: Pleural based scarring with calcification at the right apex. IMPRESSION: 1. No evidence of acute intracranial or cervical spine injury. 2. Right mastoid opacification. 3. Degenerative and senescent changes described above. Electronically Signed   By: Jorje Guild M.D.   On: 05/05/2022 04:51   CT Cervical Spine Wo Contrast  Result Date: 05/05/2022 CLINICAL DATA:  Neck trauma.  Weakness and fall.  COVID positive EXAM: CT HEAD WITHOUT CONTRAST CT CERVICAL SPINE WITHOUT CONTRAST TECHNIQUE: Multidetector CT imaging of the head and cervical spine was performed following the standard protocol without intravenous contrast. Multiplanar CT image reconstructions of the cervical spine were also generated. RADIATION DOSE REDUCTION: This exam was performed according to the departmental dose-optimization program which includes automated exposure control, adjustment of the mA and/or kV according to patient size and/or use of iterative reconstruction technique. COMPARISON:  07/21/2016 FINDINGS: CT HEAD FINDINGS Brain: No evidence of acute infarction, hemorrhage, hydrocephalus, extra-axial collection or mass lesion/mass effect. Moderate brain atrophy and chronic small  vessel ischemia. A chronic lacunar infarct has occurred in the right thalamus. Vascular: No hyperdense vessel or unexpected calcification. Skull: Normal. Negative for fracture or focal lesion. Sinuses/Orbits: Partial right mastoid opacification with negative nasopharynx. CT CERVICAL SPINE FINDINGS Alignment: No traumatic malalignment Skull base and vertebrae: No acute fracture.  Subjective osteopenia Soft tissues and spinal canal: No prevertebral fluid or swelling. No visible canal hematoma. Disc levels: Advanced and generalized degeneration without visible cord impingement. C3-4 facet ankylosis on the right where there is bulky spurring encroaching on the foramen. Upper chest: Pleural based scarring with calcification at the right apex. IMPRESSION: 1. No evidence of acute intracranial or cervical spine injury. 2. Right mastoid opacification. 3. Degenerative and senescent changes described above. Electronically Signed   By: Jorje Guild M.D.   On: 05/05/2022 04:51            LOS: 2 days       Emeterio Reeve, DO [facility] 05/07/2022, 1:13 PM  Staff may message me via secure chat in Epic  but this may not receive immediate response,  please page for urgent matters!  If 7PM-7AM, please contact night-coverage [email or web address]  Dictation software was used to generate the above note. Typos may occur and escape review, as with typed/written notes. Please contact Dr Deyvi Bonanno directly for clarity if needed.     

## 2022-05-08 MED ORDER — ADULT MULTIVITAMIN W/MINERALS CH
1.0000 | ORAL_TABLET | Freq: Every day | ORAL | Status: DC
  Administered 2022-05-09 – 2022-05-10 (×2): 1 via ORAL
  Filled 2022-05-08 (×3): qty 1

## 2022-05-08 NOTE — Plan of Care (Signed)

## 2022-05-08 NOTE — Progress Notes (Signed)
PROGRESS NOTE    Gabriella Rogers   [id number] DOB: 1934-01-03  DOA: 05/05/2022 Date of Service: 05/08/22 PCP: Cletis Athens, MD     Brief Narrative / Hospital Course:  Gabriella Rogers is a 88 y.o. female with medical history significant  for dementia, depression, history of prior stroke, dyslipidemia, recent hospitalization for COVID-19 viral illness with superimposed bacterial pneumonia who returns to the emergency room for evaluation of an unwitnessed fall.  Per ER documentation patient was found on the bathroom floor by staff at [facility] with complaints of right hip pain. Patient states that she thinks the floor was wet and she tripped and fell.  11/15: Pelvic x-ray shows new irregularity of the right acetabulum and obturator ring. CT of the right hip without contrast shows acute, branching fracture through the right superior acetabulum with mild protrusion deformity. Nondisplaced right inferior pubic ramus fracture. Pronounced osteopenia. Expected soft tissue hemorrhage in the deep pelvis. Admitting hospitalist notes that daughter requests nonsurgical intervention, she also confirms pt is nonambulatory but can typically transfer bed/wheelchair. Dementia limits ability to perform complete ROS / obtain much history from the patient. Pt admitted for pain control and orthopedic consult, treat AKI, eval possible causes syncope  11/16: AKI resolved, VSS. Ortho clear for discharge from their standpoint. PT/OT eval pending. TOC following, suspect will need SNF for long term care. TOC following 11/17-11/18: medically stable. awaiting SNF at [facility] - auth pending thru the weekend. [facility] 11/17  Consultants:  Orthopedic Surgery   Procedures: none      ASSESSMENT & PLAN:   Principal Problem:   Unwitnessed fall Active Problems:   COVID-19   Closed right acetabular fracture (HCC)   Closed fracture of right inferior pubic ramus (HCC)   AKI (acute kidney injury)  ([place])   CVA (cerebral vascular accident) ([place])   Dementia without behavioral disturbance ([place])   Essential hypertension   Sinus bradycardia   Malnutrition of moderate degree  Closed right acetabular fracture (HCC) Status post fall with a closed right acetabular fracture Nonsurgical management if possible Pain control with scheduled acetaminophen and as needed tramadol per Ortho recommendation Fall precautions Orthopedic surgery consult PT/OT Pt may need SNF for long term care   Closed fracture of right inferior pubic ramus ([place]) Status post fall with closed fracture of right inferior pubic ramus Nonsurgical management if possible Pain control with scheduled acetaminophen and as needed tramadol per Ortho recommendation Fall precautions Orthopedic surgery consult PT/OT Pt may need SNF for long term care   Sinus bradycardia Patient noted to be bradycardic on the monitor She is started post an unwitnessed fall and it is unclear if she had a syncopal episode. Donepezil was discontinued during her last hospitalization due to sinus bradycardia Obtain TSH Patient's history is limited due to her underlying dementia  AKI (acute kidney injury) ([place]) - resolved Most likely related to diuretic therapy Patient has a baseline serum creatinine of 0.7 and on admission it is 1.13 Judicious IV fluid resuscitation Hold HCTZ Repeat renal parameter is in a.m. --> improved   COVID-19 Lobar pneumonia - improved on CXR Recent hospitalization for COVID-19 viral illness w/ lobar pneumonia Patient will remain on isolation Finishing abx for pneumonia   Hypokalmia - resolved Repleted Monitor periodic BMP  Unwitnessed fall Patient was brought into the ER for evaluation of unwitnessed fall with right-sided hip pain and imaging shows a right superior acetabulum fracture with mild protrusio deformity and non displaced right inferior pubic ramus  fracture. We will place patient on fall  precautions Orthopedic surgery consult PT/OT  Essential hypertension Hold hydrochlorothiazide due to relative hypotension and AKI  Dementia without behavioral disturbance (HCC) Continue Paxil  Hx CVA (cerebral vascular accident) (HCC) History of prior stroke on dual antiplatelet therapy and atorvastatin Hold aspirin and Plavix for now due to CT scan findings suggestive of expected soft tissue hemorrhage in the deep pelvis --> this was restarted by admitting hospitalist after consultation w/ Dr. Audelia Acton  c continue atorvastatin   DVT prophylaxis: on ASA/Plavix, adding Lovenox now that Hgb seems to have normalized  Pertinent IV fluids/nutrition: no continuous fluids Central lines / invasive devices: none  Code Status: DNR Family Communication: spoke w/ daughter yesterday on the phone - will call with any updates   Disposition: inpatient  TOC needs: will need SNF for rehab / long term care  Barriers to discharge / significant pending items: awaiting SNF at [facility] - auth pending              Subjective:  Patient resting comfortable, no concerns from RN        Objective:  Vitals:   05/07/22 1452 05/07/22 2033 05/08/22 0527 05/08/22 0746  BP: 131/68 (!) 153/70 126/83 (!) 147/70  Pulse: 72 76 83 73  Resp: 16 16 16    Temp: 98.1 F (36.7 C) 99.1 F (37.3 C) 98.4 F (36.9 C) 98.3 F (36.8 C)  TempSrc:      SpO2: 100% 96% 95% 98%  Weight:      Height:        Intake/Output Summary (Last 24 hours) at 05/08/2022 1340 Last data filed at 05/08/2022 0546 Gross per 24 hour  Intake --  Output 750 ml  Net -750 ml    Filed Weights   05/05/22 0515  Weight: 51.9 kg    Examination:  Constitutional:  VS as above General Appearance: NAD Respiratory: Normal respiratory effort      Scheduled Medications:   acetaminophen  1,000 mg Oral Q8H   aspirin EC  81 mg Oral Daily   atorvastatin  80 mg Oral q1800   clopidogrel  75 mg Oral Daily   famotidine  20 mg  Oral Daily   feeding supplement  237 mL Oral BID BM   [START ON 05/09/2022] multivitamin with minerals  1 tablet Oral Daily   PARoxetine  10 mg Oral Daily    Continuous Infusions:   PRN Medications:  artificial tears, ondansetron **OR** ondansetron (ZOFRAN) IV, polyethylene glycol, traMADol  Antimicrobials:  Anti-infectives (From admission, onward)    Start     Dose/Rate Route Frequency Ordered Stop   05/05/22 1000  amoxicillin-clavulanate (AUGMENTIN) 500-125 MG per tablet 1 tablet        1 tablet Oral 2 times daily 05/05/22 0903 05/07/22 0959   05/05/22 1000  doxycycline (VIBRA-TABS) tablet 100 mg        100 mg Oral 2 times daily 05/05/22 0903 05/06/22 2243       Data Reviewed: I have personally reviewed following labs and imaging studies  CBC: Recent Labs  Lab 05/05/22 0603 05/06/22 0559 05/07/22 0940  WBC 13.6* 7.0 10.2  NEUTROABS 11.2*  --   --   HGB 12.3 9.3* 11.0*  HCT 35.6* 27.3* 32.2*  MCV 99.7 98.9 100.0  PLT 270 216 264    Basic Metabolic Panel: Recent Labs  Lab 05/05/22 0603 05/06/22 0559 05/07/22 0940  NA 138 139 138  K 3.5 3.2* 4.5  CL 107 112*  110  CO2 21* 24 23  GLUCOSE 109* 85 92  BUN 36* 29* 16  CREATININE 1.13* 0.72 0.62  CALCIUM 8.6* 7.9* 8.2*    GFR: Estimated Creatinine Clearance: 41.4 mL/min (by C-G formula based on SCr of 0.62 mg/dL). Liver Function Tests: Recent Labs  Lab 05/05/22 0603  AST 89*  ALT 57*  ALKPHOS 73  BILITOT 1.2  PROT 7.0  ALBUMIN 3.5    No results for input(s): "LIPASE", "AMYLASE" in the last 168 hours. No results for input(s): "AMMONIA" in the last 168 hours. Coagulation Profile: No results for input(s): "INR", "PROTIME" in the last 168 hours. Cardiac Enzymes: Recent Labs  Lab 05/05/22 0603  CKTOTAL 996*    BNP (last 3 results) No results for input(s): "PROBNP" in the last 8760 hours. HbA1C: No results for input(s): "HGBA1C" in the last 72 hours. CBG: No results for input(s): "GLUCAP" in  the last 168 hours. Lipid Profile: No results for input(s): "CHOL", "HDL", "LDLCALC", "TRIG", "CHOLHDL", "LDLDIRECT" in the last 72 hours. Thyroid Function Tests: No results for input(s): "TSH", "T4TOTAL", "FREET4", "T3FREE", "THYROIDAB" in the last 72 hours.  Anemia Panel: No results for input(s): "VITAMINB12", "FOLATE", "FERRITIN", "TIBC", "IRON", "RETICCTPCT" in the last 72 hours. Urine analysis:    Component Value Date/Time   COLORURINE YELLOW (A) 05/06/2022 0615   APPEARANCEUR CLOUDY (A) 05/06/2022 0615   APPEARANCEUR Clear 10/31/2017 1420   LABSPEC 1.033 (H) 05/06/2022 0615   LABSPEC 1.012 02/25/2014 1511   PHURINE 5.0 05/06/2022 0615   GLUCOSEU NEGATIVE 05/06/2022 0615   GLUCOSEU Negative 02/25/2014 1511   HGBUR MODERATE (A) 05/06/2022 0615   BILIRUBINUR NEGATIVE 05/06/2022 0615   BILIRUBINUR Negative 04/13/2021 1529   BILIRUBINUR Negative 10/31/2017 1420   BILIRUBINUR Negative 02/25/2014 1511   KETONESUR NEGATIVE 05/06/2022 0615   PROTEINUR 30 (A) 05/06/2022 0615   UROBILINOGEN 1.0 04/13/2021 1529   NITRITE NEGATIVE 05/06/2022 0615   LEUKOCYTESUR SMALL (A) 05/06/2022 0615   LEUKOCYTESUR Negative 02/25/2014 1511   Sepsis Labs: @LABRCNTIP (procalcitonin:4,lacticidven:4)  Recent Results (from the past 240 hour(s))  Resp Panel by RT-PCR (Flu A&B, Covid) Anterior Nasal Swab     Status: Abnormal   Collection Time: 04/30/22 12:21 PM   Specimen: Anterior Nasal Swab  Result Value Ref Range Status   SARS Coronavirus 2 by RT PCR POSITIVE (A) NEGATIVE Final    Comment: (NOTE) SARS-CoV-2 target nucleic acids are DETECTED.  The SARS-CoV-2 RNA is generally detectable in upper respiratory specimens during the acute phase of infection. Positive results are indicative of the presence of the identified virus, but do not rule out bacterial infection or co-infection with other pathogens not detected by the test. Clinical correlation with patient history and other diagnostic  information is necessary to determine patient infection status. The expected result is Negative.  Fact Sheet for Patients: EntrepreneurPulse.com.au  Fact Sheet for Healthcare Providers: IncredibleEmployment.be  This test is not yet approved or cleared by the [place] FDA and  has been authorized for detection and/or diagnosis of SARS-CoV-2 by FDA under an Emergency Use Authorization (EUA).  This EUA will remain in effect (meaning this test can be used) for the duration of  the COVID-19 declaration under Section 564(b)(1) of the A ct, 21 U.S.C. section 360bbb-3(b)(1), unless the authorization is terminated or revoked sooner.     Influenza A by PCR NEGATIVE NEGATIVE Final   Influenza B by PCR NEGATIVE NEGATIVE Final    Comment: (NOTE) The Xpert Xpress SARS-CoV-2/FLU/RSV plus assay is intended as an aid  in the diagnosis of influenza from Nasopharyngeal swab specimens and should not be used as a sole basis for treatment. Nasal washings and aspirates are unacceptable for Xpert Xpress SARS-CoV-2/FLU/RSV testing.  Fact Sheet for Patients: EntrepreneurPulse.com.au  Fact Sheet for Healthcare Providers: IncredibleEmployment.be  This test is not yet approved or cleared by the [place] FDA and has been authorized for detection and/or diagnosis of SARS-CoV-2 by FDA under an Emergency Use Authorization (EUA). This EUA will remain in effect (meaning this test can be used) for the duration of the COVID-19 declaration under Section 564(b)(1) of the Act, 21 U.S.C. section 360bbb-3(b)(1), unless the authorization is terminated or revoked.  Performed at [facility], [address] [place], [place] [address]          Radiology Studies: CT Hip Right Wo Contrast  Result Date: 05/05/2022 CLINICAL DATA:  Hip fracture suspected EXAM: CT OF THE RIGHT HIP WITHOUT CONTRAST TECHNIQUE: Multidetector CT  imaging of the right hip was performed according to the standard protocol. Multiplanar CT image reconstructions were also generated. RADIATION DOSE REDUCTION: This exam was performed according to the departmental dose-optimization program which includes automated exposure control, adjustment of the mA and/or kV according to patient size and/or use of iterative reconstruction technique. COMPARISON:  Pelvis radiography from earlier today FINDINGS: Confirmed branching right acetabular fracture with mild protrusio deformity on coronal reformats. There is a mildly displaced fracture through the inferior pubic ramus on the right. Pronounced osteopenia. Expected hemorrhage in the pelvis adjacent to the fracture. Simple right adnexal cyst measuring 2.7 cm. No follow-up imaging recommended. Note: This recommendation does not apply to premenarchal patients and to those with increased risk (genetic, family history, elevated tumor markers or other high-risk factors) of ovarian cancer. Reference: JACR 2020 Feb; 17(2):248-254 IMPRESSION: 1. Acute, branching fracture through the right superior acetabulum with mild protrusio deformity. 2. Nondisplaced right inferior pubic ramus fracture. 3. Pronounced osteopenia. 4. Expected soft tissue hemorrhage in the deep pelvis. Electronically Signed   By: Jorje Guild M.D.   On: 05/05/2022 06:31   DG Chest Port 1 View  Result Date: 05/05/2022 CLINICAL DATA:  Fall with cough.  COVID. EXAM: PORTABLE CHEST 1 VIEW COMPARISON:  04/30/2022 FINDINGS: Resolved left lower lobe pneumonia. Right apical opacity which is pleural based scarring and calcification by cervical spine CT. Normal heart size and mediastinal contours. No acute finding. IMPRESSION: Significantly improved aeration at the left base. No new or acute finding. Electronically Signed   By: Jorje Guild M.D.   On: 05/05/2022 05:05   DG Pelvis Portable  Result Date: 05/05/2022 CLINICAL DATA:  Fall.  Unsure which hip is  painful EXAM: PORTABLE PELVIS 1-2 VIEWS COMPARISON:  01/09/2022 FINDINGS: Subjective osteopenia. Distorted iliopectineal line on the right, not seen previously, without discrete fracture line. Irregularity at the inferior right pubic ramus which also appears new and consistent with fracture. Remote fracture of the right lateral iliac wing. Both hips appear located in the proximal femurs appear intact. IMPRESSION: New irregularity of the right acetabulum and obturator ring, recommend CT. Electronically Signed   By: Jorje Guild M.D.   On: 05/05/2022 05:03   CT Head Wo Contrast  Result Date: 05/05/2022 CLINICAL DATA:  Neck trauma.  Weakness and fall.  COVID positive EXAM: CT HEAD WITHOUT CONTRAST CT CERVICAL SPINE WITHOUT CONTRAST TECHNIQUE: Multidetector CT imaging of the head and cervical spine was performed following the standard protocol without intravenous contrast. Multiplanar CT image reconstructions of the cervical spine were also generated.  RADIATION DOSE REDUCTION: This exam was performed according to the departmental dose-optimization program which includes automated exposure control, adjustment of the mA and/or kV according to patient size and/or use of iterative reconstruction technique. COMPARISON:  07/21/2016 FINDINGS: CT HEAD FINDINGS Brain: No evidence of acute infarction, hemorrhage, hydrocephalus, extra-axial collection or mass lesion/mass effect. Moderate brain atrophy and chronic small vessel ischemia. A chronic lacunar infarct has occurred in the right thalamus. Vascular: No hyperdense vessel or unexpected calcification. Skull: Normal. Negative for fracture or focal lesion. Sinuses/Orbits: Partial right mastoid opacification with negative nasopharynx. CT CERVICAL SPINE FINDINGS Alignment: No traumatic malalignment Skull base and vertebrae: No acute fracture.  Subjective osteopenia Soft tissues and spinal canal: No prevertebral fluid or swelling. No visible canal hematoma. Disc levels:  Advanced and generalized degeneration without visible cord impingement. C3-4 facet ankylosis on the right where there is bulky spurring encroaching on the foramen. Upper chest: Pleural based scarring with calcification at the right apex. IMPRESSION: 1. No evidence of acute intracranial or cervical spine injury. 2. Right mastoid opacification. 3. Degenerative and senescent changes described above. Electronically Signed   By: Jorje Guild M.D.   On: 05/05/2022 04:51   CT Cervical Spine Wo Contrast  Result Date: 05/05/2022 CLINICAL DATA:  Neck trauma.  Weakness and fall.  COVID positive EXAM: CT HEAD WITHOUT CONTRAST CT CERVICAL SPINE WITHOUT CONTRAST TECHNIQUE: Multidetector CT imaging of the head and cervical spine was performed following the standard protocol without intravenous contrast. Multiplanar CT image reconstructions of the cervical spine were also generated. RADIATION DOSE REDUCTION: This exam was performed according to the departmental dose-optimization program which includes automated exposure control, adjustment of the mA and/or kV according to patient size and/or use of iterative reconstruction technique. COMPARISON:  07/21/2016 FINDINGS: CT HEAD FINDINGS Brain: No evidence of acute infarction, hemorrhage, hydrocephalus, extra-axial collection or mass lesion/mass effect. Moderate brain atrophy and chronic small vessel ischemia. A chronic lacunar infarct has occurred in the right thalamus. Vascular: No hyperdense vessel or unexpected calcification. Skull: Normal. Negative for fracture or focal lesion. Sinuses/Orbits: Partial right mastoid opacification with negative nasopharynx. CT CERVICAL SPINE FINDINGS Alignment: No traumatic malalignment Skull base and vertebrae: No acute fracture.  Subjective osteopenia Soft tissues and spinal canal: No prevertebral fluid or swelling. No visible canal hematoma. Disc levels: Advanced and generalized degeneration without visible cord impingement. C3-4 facet  ankylosis on the right where there is bulky spurring encroaching on the foramen. Upper chest: Pleural based scarring with calcification at the right apex. IMPRESSION: 1. No evidence of acute intracranial or cervical spine injury. 2. Right mastoid opacification. 3. Degenerative and senescent changes described above. Electronically Signed   By: Jorje Guild M.D.   On: 05/05/2022 04:51            LOS: 3 days       Emeterio Reeve, DO [facility] 05/08/2022, 1:40 PM   Staff may message me via secure chat in [place]  but this may not receive immediate response,  please page for urgent matters!  If 7PM-7AM, please contact night-coverage [email or web address]  Dictation software was used to generate the above note. Typos may occur and escape review, as with typed/written notes. Please contact Dr Sheppard Coil directly for clarity if needed.

## 2022-05-08 NOTE — Progress Notes (Signed)
Physical Therapy Treatment Patient Details Name: Gabriella Rogers MRN: [id number] DOB: 10/14/1933 Today's Date: 05/08/2022   History of Present Illness Gabriella Rogers is a 88 y.o. female with medical history significant  for dementia, depression, history of prior stroke, dyslipidemia, recent hospitalization for COVID-19 viral illness with superimposed bacterial pneumonia who returns to the emergency room for evaluation of an unwitnessed fall.  Per ER documentation patient was found on the bathroom floor with complaints of right hip pain.    PT Comments    Pt received in bed, pleasantly confused and willing to mobilize with PT despite R hip discomfort with movement. ModA for supine to sit with HOB raised, Sit<>stand and stand pivot transfer to bedside chair with ModA, no AD, in order to maintain TTWB R LE. Pt tolerated well despite only having Tylenol on board. Continue PT per POC, currently awaiting insurance auth. For transition to [facility].   Recommendations for follow up therapy are one component of a multi-disciplinary discharge planning process, led by the attending physician.  Recommendations may be updated based on patient status, additional functional criteria and insurance authorization.  Follow Up Recommendations  Skilled nursing-short term rehab (<3 hours/day) Can patient physically be transported by private vehicle: No   Assistance Recommended at Discharge Frequent or constant Supervision/Assistance  Patient can return home with the following Two people to help with walking and/or transfers;Help with stairs or ramp for entrance;Assist for transportation;Assistance with cooking/housework;Two people to help with bathing/dressing/bathroom;Direct supervision/assist for financial management;Direct supervision/assist for medications management   Equipment Recommendations  Other (comment) (TBD at next facility)    Recommendations for Other Services       Precautions / Restrictions  Precautions Precautions: Fall Precaution Comments: Per orthopedic, due to baseline dementia, okay to perform limited WB on RLE if can not follow TTWB. Restrictions Weight Bearing Restrictions: Yes RLE Weight Bearing: Touchdown weight bearing LLE Weight Bearing: Weight bearing as tolerated     Mobility  Bed Mobility Overal bed mobility: Needs Assistance Bed Mobility: Supine to Sit     Supine to sit: Mod assist, HOB elevated Sit to supine: Max assist   General bed mobility comments: Improvement in UE/LE obiltiy to EOB. Still needs modA to complete.    Transfers Overall transfer level: Needs assistance Equipment used: None Transfers: Bed to chair/wheelchair/BSC Sit to Stand: Mod assist, From elevated surface Stand pivot transfers: Mod assist Step pivot transfers: Mod assist, From elevated surface       General transfer comment: modA to stand. In standing pt able to maintain TTWB on RLE. With step pivot pt requires increased time, increased WB on RLE but appears still partial, maintains most weight on her LLE.    Ambulation/Gait               General Gait Details: deferred   Stairs             Wheelchair Mobility    Modified Rankin (Stroke Patients Only)       Balance                                            Cognition Arousal/Alertness: Awake/alert Behavior During Therapy: WFL for tasks assessed/performed Overall Cognitive Status: History of cognitive impairments - at baseline Area of Impairment: Orientation                 Orientation Level: Disoriented  to, Situation     Following Commands: Follows one step commands with increased time Safety/Judgement: Decreased awareness of deficits   Problem Solving: Requires verbal cues, Requires tactile cues, Difficulty sequencing, Decreased initiation, Slow processing General Comments: Does not remember precautions. Requires encouragement and education to participate and  expectations of mobility based off of her current fractures.        Exercises General Exercises - Lower Extremity Ankle Circles/Pumps: AROM, Strengthening, Both, 15 reps, Supine Long Arc Quad: AROM, Both, 5 reps Heel Slides: AROM, Left, AAROM, Right, 10 reps Hip ABduction/ADduction: AAROM, Right, 5 reps, Supine Other Exercises Other Exercises: education provided to pt and pt's son regarding POC, current d/c plans, and wt bearing restrictions.    General Comments General comments (skin integrity, edema, etc.): no edema noted. Pt very pleasant and cooperative      Pertinent Vitals/Pain Pain Assessment Pain Assessment: Faces Faces Pain Scale: Hurts little more Facial Expression: facial grimacing Pain Location: right hip Pain Descriptors / Indicators: Aching, Grimacing, Guarding Pain Intervention(s): Monitored during session, Repositioned, Limited activity within patient's tolerance    Home Living                          Prior Function            PT Goals (current goals can now be found in the care plan section) Acute Rehab PT Goals Patient Stated Goal: get walking better    Frequency    7X/week      PT Plan Current plan remains appropriate    Co-evaluation              AM-PAC PT "6 Clicks" Mobility   Outcome Measure  Help needed turning from your back to your side while in a flat bed without using bedrails?: A Lot Help needed moving from lying on your back to sitting on the side of a flat bed without using bedrails?: A Lot Help needed moving to and from a bed to a chair (including a wheelchair)?: A Lot Help needed standing up from a chair using your arms (e.g., wheelchair or bedside chair)?: A Lot Help needed to walk in hospital room?: Total Help needed climbing 3-5 steps with a railing? : Total 6 Click Score: 10    End of Session Equipment Utilized During Treatment: Gait belt Activity Tolerance: Patient tolerated treatment well;Patient  limited by pain Patient left: in chair;with call bell/phone within reach;with chair alarm set;with family/visitor present Nurse Communication: Mobility status PT Visit Diagnosis: Muscle weakness (generalized) (M62.81);Difficulty in walking, not elsewhere classified (R26.2);Unsteadiness on feet (R26.81);Pain;History of falling (Z91.81) Pain - Right/Left: Left Pain - part of body: Hip;Leg     Time: 1040-1110 PT Time Calculation (min) (ACUTE ONLY): 30 min  Charges:  $Therapeutic Exercise: 8-22 mins $Therapeutic Activity: 8-22 mins                    Zadie Cleverly, PTA    Jannet Askew 05/08/2022, 11:18 AM

## 2022-05-09 NOTE — Progress Notes (Signed)
Physical Therapy Treatment Patient Details Name: Gabriella Rogers MRN: [id number] DOB: 12-Dec-1933 Today's Date: 05/09/2022   History of Present Illness Gabriella Rogers is a 88 y.o. female with medical history significant  for dementia, depression, history of prior stroke, dyslipidemia, recent hospitalization for COVID-19 viral illness with superimposed bacterial pneumonia who returns to the emergency room for evaluation of an unwitnessed fall.  Per ER documentation patient was found on the bathroom floor with complaints of right hip pain.    PT Comments    Participated in exercises as described below.  She is given time to get to EOB on her own.  Overall does well but does need min assist to get fully to edge of bed.  She does voice fear of falling which may be her hesitation to get her feet all the way to floor.  Once seated she is steady.  Stood with min a x 1 and is able to do standing AROM RLE before taking very small pivot steps to chair with walker support.  Initially min a x 1 but as she turns she does require mod a x 1 mostly for post lean and guidance to chair.  Remained up with needs met.   Recommendations for follow up therapy are one component of a multi-disciplinary discharge planning process, led by the attending physician.  Recommendations may be updated based on patient status, additional functional criteria and insurance authorization.  Follow Up Recommendations  Skilled nursing-short term rehab (<3 hours/day)     Assistance Recommended at Discharge Frequent or constant Supervision/Assistance  Patient can return home with the following Help with stairs or ramp for entrance;Assist for transportation;Assistance with cooking/housework;Direct supervision/assist for financial management;Direct supervision/assist for medications management;A lot of help with bathing/dressing/bathroom;A lot of help with walking and/or transfers   Equipment Recommendations  Rolling walker (2 wheels)     Recommendations for Other Services       Precautions / Restrictions Precautions Precautions: Fall Precaution Comments: Per orthopedic, due to baseline dementia, okay to perform limited WB on RLE if can not follow TTWB. Restrictions Weight Bearing Restrictions: Yes RLE Weight Bearing: Touchdown weight bearing LLE Weight Bearing: Weight bearing as tolerated     Mobility  Bed Mobility Overal bed mobility: Needs Assistance Bed Mobility: Supine to Sit     Supine to sit: Min assist     General bed mobility comments: does well sitting up with increased time but does need help getting hips fully to EOB    Transfers Overall transfer level: Needs assistance Equipment used: Rolling walker (2 wheels) Transfers: Sit to/from Stand Sit to Stand: Min assist   Step pivot transfers: Mod assist, From elevated surface       General transfer comment: difficulty but does do well transitioning to chair with time, cues and increasing assist due to post lean as she turns to chair.  trouble maintaining WB status.    Ambulation/Gait               General Gait Details: unable to take steps and maintian WB status   Stairs             Wheelchair Mobility    Modified Rankin (Stroke Patients Only)       Balance Overall balance assessment: Needs assistance Sitting-balance support: No upper extremity supported Sitting balance-Leahy Scale: Fair     Standing balance support: Bilateral upper extremity supported, During functional activity, Reliant on assistive device for balance Standing balance-Leahy Scale: Poor Standing balance comment: significant weight  onto LLE and BUE's on RW.                            Cognition Arousal/Alertness: Awake/alert Behavior During Therapy: WFL for tasks assessed/performed Overall Cognitive Status: History of cognitive impairments - at baseline                                          Exercises Other  Exercises Other Exercises: supine RLE AAROM and standing RLE AROM    General Comments        Pertinent Vitals/Pain Pain Assessment Pain Assessment: Faces Faces Pain Scale: Hurts a little bit Pain Location: right hip Pain Descriptors / Indicators: Aching, Grimacing, Guarding Pain Intervention(s): Limited activity within patient's tolerance, Monitored during session, Repositioned    Home Living                          Prior Function            PT Goals (current goals can now be found in the care plan section) Progress towards PT goals: Progressing toward goals    Frequency    7X/week      PT Plan Current plan remains appropriate    Co-evaluation              AM-PAC PT "6 Clicks" Mobility   Outcome Measure  Help needed turning from your back to your side while in a flat bed without using bedrails?: A Little Help needed moving from lying on your back to sitting on the side of a flat bed without using bedrails?: A Little Help needed moving to and from a bed to a chair (including a wheelchair)?: A Lot Help needed standing up from a chair using your arms (e.g., wheelchair or bedside chair)?: A Lot Help needed to walk in hospital room?: Total Help needed climbing 3-5 steps with a railing? : Total 6 Click Score: 12    End of Session Equipment Utilized During Treatment: Gait belt Activity Tolerance: Patient tolerated treatment well Patient left: in chair;with call bell/phone within reach;with chair alarm set Nurse Communication: Mobility status PT Visit Diagnosis: Muscle weakness (generalized) (M62.81);Difficulty in walking, not elsewhere classified (R26.2);Unsteadiness on feet (R26.81);Pain;History of falling (Z91.81) Pain - Right/Left: Left Pain - part of body: Hip;Leg     Time: 1308-6578 PT Time Calculation (min) (ACUTE ONLY): 18 min  Charges:  $Therapeutic Exercise: 8-22 mins                   Chesley Noon, PTA 05/09/22, 10:36 AM

## 2022-05-09 NOTE — Progress Notes (Signed)
PROGRESS NOTE    Gabriella Rogers   [id number] DOB: 04/11/1934  DOA: 05/05/2022 Date of Service: 05/09/22 PCP: Cletis Athens, MD     Brief Narrative / Hospital Course:  Gabriella Rogers is a 88 y.o. female with medical history significant  for dementia, depression, history of prior stroke, dyslipidemia, recent hospitalization for COVID-19 viral illness with superimposed bacterial pneumonia who returns to the emergency room for evaluation of an unwitnessed fall.  Per ER documentation patient was found on the bathroom floor by staff at [facility] with complaints of right hip pain. Patient states that she thinks the floor was wet and she tripped and fell.  11/15: Pelvic x-ray shows new irregularity of the right acetabulum and obturator ring. CT of the right hip without contrast shows acute, branching fracture through the right superior acetabulum with mild protrusion deformity. Nondisplaced right inferior pubic ramus fracture. Pronounced osteopenia. Expected soft tissue hemorrhage in the deep pelvis. Admitting hospitalist notes that daughter requests nonsurgical intervention, she also confirms pt is nonambulatory but can typically transfer bed/wheelchair. Dementia limits ability to perform complete ROS / obtain much history from the patient. Pt admitted for pain control and orthopedic consult, treat AKI, eval possible causes syncope  11/16: AKI resolved, VSS. Ortho clear for discharge from their standpoint. PT/OT eval pending. TOC following, suspect will need SNF for long term care. TOC following 11/17-11/19: medically stable. awaiting SNF at [facility] - auth pending thru the weekend. Labs [facility] 11/17  Consultants:  Orthopedic Surgery   Procedures: none      ASSESSMENT & PLAN:   Principal Problem:   Unwitnessed fall Active Problems:   COVID-19   Closed right acetabular fracture (HCC)   Closed fracture of right inferior pubic ramus (HCC)   AKI (acute kidney injury)  ([place])   CVA (cerebral vascular accident) ([place])   Dementia without behavioral disturbance ([place])   Essential hypertension   Sinus bradycardia   Malnutrition of moderate degree  Closed right acetabular fracture (HCC) Status post fall with a closed right acetabular fracture Nonsurgical management if possible Pain control with scheduled acetaminophen and as needed tramadol per Ortho recommendation Fall precautions Orthopedic surgery consult PT/OT Plan SNF for long term care   Closed fracture of right inferior pubic ramus ([place]) Status post fall with closed fracture of right inferior pubic ramus Nonsurgical management if possible Pain control with scheduled acetaminophen and as needed tramadol per Ortho recommendation Fall precautions Orthopedic surgery consult PT/OT Pt may need SNF for long term care   Sinus bradycardia Patient noted to be bradycardic on the monitor She is started post an unwitnessed fall and it is unclear if she had a syncopal episode. Donepezil was discontinued during her last hospitalization due to sinus bradycardia Obtain TSH Patient's history is limited due to her underlying dementia  AKI (acute kidney injury) ([place]) - resolved Most likely related to diuretic therapy Patient has a baseline serum creatinine of 0.7 and on admission it is 1.13 Judicious IV fluid resuscitation Hold HCTZ Repeat renal parameter is in a.m. --> improved   COVID-19 Lobar pneumonia - improved on CXR Recent hospitalization for COVID-19 viral illness w/ lobar pneumonia Patient will remain on isolation Finishing abx for pneumonia   Hypokalmia - resolved Repleted Monitor periodic BMP  Unwitnessed fall Patient was brought into the ER for evaluation of unwitnessed fall with right-sided hip pain and imaging shows a right superior acetabulum fracture with mild protrusio deformity and non displaced right inferior pubic ramus fracture. We  will place patient on fall  precautions Orthopedic surgery consult PT/OT  Essential hypertension Hold hydrochlorothiazide due to relative hypotension and AKI  Dementia without behavioral disturbance (HCC) Continue Paxil  Hx CVA (cerebral vascular accident) (HCC) History of prior stroke on dual antiplatelet therapy and atorvastatin Hold aspirin and Plavix for now due to CT scan findings suggestive of expected soft tissue hemorrhage in the deep pelvis --> this was restarted by admitting hospitalist after consultation w/ Dr. Audelia Acton  c continue atorvastatin   DVT prophylaxis: on ASA/Plavix, adding Lovenox now that Hgb seems to have normalized  Pertinent IV fluids/nutrition: no continuous fluids Central lines / invasive devices: none  Code Status: DNR Family Communication:  will call with any updates   Disposition: inpatient  TOC needs: will need SNF for rehab / long term care  Barriers to discharge / significant pending items: awaiting SNF at [facility] - auth pending              Subjective:  Patient resting comfortable, no concerns from RN.         Objective:  Vitals:   05/08/22 1703 05/08/22 2033 05/09/22 0543 05/09/22 0805  BP: 102/63 131/66 126/87 132/64  Pulse: 80 79 77 64  Resp:  17 16   Temp: (!) 96.7 F (35.9 C) 99.7 F (37.6 C) 98.4 F (36.9 C) 98.3 F (36.8 C)  TempSrc:      SpO2: 98% 98% 94% 97%  Weight:      Height:        Intake/Output Summary (Last 24 hours) at 05/09/2022 1449 Last data filed at 05/09/2022 0545 Gross per 24 hour  Intake --  Output 550 ml  Net -550 ml   Filed Weights   05/05/22 0515  Weight: 51.9 kg    Examination:  Constitutional:  VS as above General Appearance: NAD Respiratory: Normal respiratory effort      Scheduled Medications:   acetaminophen  1,000 mg Oral Q8H   aspirin EC  81 mg Oral Daily   atorvastatin  80 mg Oral q1800   clopidogrel  75 mg Oral Daily   famotidine  20 mg Oral Daily   feeding supplement  237 mL Oral BID  BM   multivitamin with minerals  1 tablet Oral Daily   PARoxetine  10 mg Oral Daily    Continuous Infusions:   PRN Medications:  artificial tears, ondansetron **OR** ondansetron (ZOFRAN) IV, polyethylene glycol, traMADol  Antimicrobials:  Anti-infectives (From admission, onward)    Start     Dose/Rate Route Frequency Ordered Stop   05/05/22 1000  amoxicillin-clavulanate (AUGMENTIN) 500-125 MG per tablet 1 tablet        1 tablet Oral 2 times daily 05/05/22 0903 05/07/22 0959   05/05/22 1000  doxycycline (VIBRA-TABS) tablet 100 mg        100 mg Oral 2 times daily 05/05/22 0903 05/06/22 2243       Data Reviewed: I have personally reviewed following labs and imaging studies  CBC: Recent Labs  Lab 05/05/22 0603 05/06/22 0559 05/07/22 0940  WBC 13.6* 7.0 10.2  NEUTROABS 11.2*  --   --   HGB 12.3 9.3* 11.0*  HCT 35.6* 27.3* 32.2*  MCV 99.7 98.9 100.0  PLT 270 216 264   Basic Metabolic Panel: Recent Labs  Lab 05/05/22 0603 05/06/22 0559 05/07/22 0940  NA 138 139 138  K 3.5 3.2* 4.5  CL 107 112* 110  CO2 21* 24 23  GLUCOSE 109* 85 92  BUN 36*  29* 16  CREATININE 1.13* 0.72 0.62  CALCIUM 8.6* 7.9* 8.2*   GFR: Estimated Creatinine Clearance: 41.4 mL/min (by C-G formula based on SCr of 0.62 mg/dL). Liver Function Tests: Recent Labs  Lab 05/05/22 0603  AST 89*  ALT 57*  ALKPHOS 73  BILITOT 1.2  PROT 7.0  ALBUMIN 3.5   No results for input(s): "LIPASE", "AMYLASE" in the last 168 hours. No results for input(s): "AMMONIA" in the last 168 hours. Coagulation Profile: No results for input(s): "INR", "PROTIME" in the last 168 hours. Cardiac Enzymes: Recent Labs  Lab 05/05/22 0603  CKTOTAL 996*   BNP (last 3 results) No results for input(s): "PROBNP" in the last 8760 hours. HbA1C: No results for input(s): "HGBA1C" in the last 72 hours. CBG: No results for input(s): "GLUCAP" in the last 168 hours. Lipid Profile: No results for input(s): "CHOL", "HDL",  "LDLCALC", "TRIG", "CHOLHDL", "LDLDIRECT" in the last 72 hours. Thyroid Function Tests: No results for input(s): "TSH", "T4TOTAL", "FREET4", "T3FREE", "THYROIDAB" in the last 72 hours.  Anemia Panel: No results for input(s): "VITAMINB12", "FOLATE", "FERRITIN", "TIBC", "IRON", "RETICCTPCT" in the last 72 hours. Urine analysis:    Component Value Date/Time   COLORURINE YELLOW (A) 05/06/2022 0615   APPEARANCEUR CLOUDY (A) 05/06/2022 0615   APPEARANCEUR Clear 10/31/2017 1420   LABSPEC 1.033 (H) 05/06/2022 0615   LABSPEC 1.012 02/25/2014 1511   PHURINE 5.0 05/06/2022 0615   GLUCOSEU NEGATIVE 05/06/2022 0615   GLUCOSEU Negative 02/25/2014 1511   HGBUR MODERATE (A) 05/06/2022 0615   BILIRUBINUR NEGATIVE 05/06/2022 0615   BILIRUBINUR Negative 04/13/2021 1529   BILIRUBINUR Negative 10/31/2017 1420   BILIRUBINUR Negative 02/25/2014 1511   KETONESUR NEGATIVE 05/06/2022 0615   PROTEINUR 30 (A) 05/06/2022 0615   UROBILINOGEN 1.0 04/13/2021 1529   NITRITE NEGATIVE 05/06/2022 0615   LEUKOCYTESUR SMALL (A) 05/06/2022 0615   LEUKOCYTESUR Negative 02/25/2014 1511   Sepsis Labs: @LABRCNTIP (procalcitonin:4,lacticidven:4)  Recent Results (from the past 240 hour(s))  Resp Panel by RT-PCR (Flu A&B, Covid) Anterior Nasal Swab     Status: Abnormal   Collection Time: 04/30/22 12:21 PM   Specimen: Anterior Nasal Swab  Result Value Ref Range Status   SARS Coronavirus 2 by RT PCR POSITIVE (A) NEGATIVE Final    Comment: (NOTE) SARS-CoV-2 target nucleic acids are DETECTED.  The SARS-CoV-2 RNA is generally detectable in upper respiratory specimens during the acute phase of infection. Positive results are indicative of the presence of the identified virus, but do not rule out bacterial infection or co-infection with other pathogens not detected by the test. Clinical correlation with patient history and other diagnostic information is necessary to determine patient infection status. The expected result  is Negative.  Fact Sheet for Patients: EntrepreneurPulse.com.au  Fact Sheet for Healthcare Providers: IncredibleEmployment.be  This test is not yet approved or cleared by the [place] FDA and  has been authorized for detection and/or diagnosis of SARS-CoV-2 by FDA under an Emergency Use Authorization (EUA).  This EUA will remain in effect (meaning this test can be used) for the duration of  the COVID-19 declaration under Section 564(b)(1) of the A ct, 21 U.S.C. section 360bbb-3(b)(1), unless the authorization is terminated or revoked sooner.     Influenza A by PCR NEGATIVE NEGATIVE Final   Influenza B by PCR NEGATIVE NEGATIVE Final    Comment: (NOTE) The Xpert Xpress SARS-CoV-2/FLU/RSV plus assay is intended as an aid in the diagnosis of influenza from Nasopharyngeal swab specimens and should not be used as a sole  basis for treatment. Nasal washings and aspirates are unacceptable for Xpert Xpress SARS-CoV-2/FLU/RSV testing.  Fact Sheet for Patients: EntrepreneurPulse.com.au  Fact Sheet for Healthcare Providers: IncredibleEmployment.be  This test is not yet approved or cleared by the [place] FDA and has been authorized for detection and/or diagnosis of SARS-CoV-2 by FDA under an Emergency Use Authorization (EUA). This EUA will remain in effect (meaning this test can be used) for the duration of the COVID-19 declaration under Section 564(b)(1) of the Act, 21 U.S.C. section 360bbb-3(b)(1), unless the authorization is terminated or revoked.  Performed at [facility], [address], [place], [place] [address]          Radiology Studies: CT Hip Right Wo Contrast  Result Date: 05/05/2022 CLINICAL DATA:  Hip fracture suspected EXAM: CT OF THE RIGHT HIP WITHOUT CONTRAST TECHNIQUE: Multidetector CT imaging of the right hip was performed according to the standard protocol.  Multiplanar CT image reconstructions were also generated. RADIATION DOSE REDUCTION: This exam was performed according to the departmental dose-optimization program which includes automated exposure control, adjustment of the mA and/or kV according to patient size and/or use of iterative reconstruction technique. COMPARISON:  Pelvis radiography from earlier today FINDINGS: Confirmed branching right acetabular fracture with mild protrusio deformity on coronal reformats. There is a mildly displaced fracture through the inferior pubic ramus on the right. Pronounced osteopenia. Expected hemorrhage in the pelvis adjacent to the fracture. Simple right adnexal cyst measuring 2.7 cm. No follow-up imaging recommended. Note: This recommendation does not apply to premenarchal patients and to those with increased risk (genetic, family history, elevated tumor markers or other high-risk factors) of ovarian cancer. Reference: JACR 2020 Feb; 17(2):248-254 IMPRESSION: 1. Acute, branching fracture through the right superior acetabulum with mild protrusio deformity. 2. Nondisplaced right inferior pubic ramus fracture. 3. Pronounced osteopenia. 4. Expected soft tissue hemorrhage in the deep pelvis. Electronically Signed   By: Jorje Guild M.D.   On: 05/05/2022 06:31   DG Chest Port 1 View  Result Date: 05/05/2022 CLINICAL DATA:  Fall with cough.  COVID. EXAM: PORTABLE CHEST 1 VIEW COMPARISON:  04/30/2022 FINDINGS: Resolved left lower lobe pneumonia. Right apical opacity which is pleural based scarring and calcification by cervical spine CT. Normal heart size and mediastinal contours. No acute finding. IMPRESSION: Significantly improved aeration at the left base. No new or acute finding. Electronically Signed   By: Jorje Guild M.D.   On: 05/05/2022 05:05   DG Pelvis Portable  Result Date: 05/05/2022 CLINICAL DATA:  Fall.  Unsure which hip is painful EXAM: PORTABLE PELVIS 1-2 VIEWS COMPARISON:  01/09/2022 FINDINGS:  Subjective osteopenia. Distorted iliopectineal line on the right, not seen previously, without discrete fracture line. Irregularity at the inferior right pubic ramus which also appears new and consistent with fracture. Remote fracture of the right lateral iliac wing. Both hips appear located in the proximal femurs appear intact. IMPRESSION: New irregularity of the right acetabulum and obturator ring, recommend CT. Electronically Signed   By: Jorje Guild M.D.   On: 05/05/2022 05:03   CT Head Wo Contrast  Result Date: 05/05/2022 CLINICAL DATA:  Neck trauma.  Weakness and fall.  COVID positive EXAM: CT HEAD WITHOUT CONTRAST CT CERVICAL SPINE WITHOUT CONTRAST TECHNIQUE: Multidetector CT imaging of the head and cervical spine was performed following the standard protocol without intravenous contrast. Multiplanar CT image reconstructions of the cervical spine were also generated. RADIATION DOSE REDUCTION: This exam was performed according to the departmental dose-optimization program which includes automated exposure  control, adjustment of the mA and/or kV according to patient size and/or use of iterative reconstruction technique. COMPARISON:  07/21/2016 FINDINGS: CT HEAD FINDINGS Brain: No evidence of acute infarction, hemorrhage, hydrocephalus, extra-axial collection or mass lesion/mass effect. Moderate brain atrophy and chronic small vessel ischemia. A chronic lacunar infarct has occurred in the right thalamus. Vascular: No hyperdense vessel or unexpected calcification. Skull: Normal. Negative for fracture or focal lesion. Sinuses/Orbits: Partial right mastoid opacification with negative nasopharynx. CT CERVICAL SPINE FINDINGS Alignment: No traumatic malalignment Skull base and vertebrae: No acute fracture.  Subjective osteopenia Soft tissues and spinal canal: No prevertebral fluid or swelling. No visible canal hematoma. Disc levels: Advanced and generalized degeneration without visible cord impingement. C3-4  facet ankylosis on the right where there is bulky spurring encroaching on the foramen. Upper chest: Pleural based scarring with calcification at the right apex. IMPRESSION: 1. No evidence of acute intracranial or cervical spine injury. 2. Right mastoid opacification. 3. Degenerative and senescent changes described above. Electronically Signed   By: Jorje Guild M.D.   On: 05/05/2022 04:51   CT Cervical Spine Wo Contrast  Result Date: 05/05/2022 CLINICAL DATA:  Neck trauma.  Weakness and fall.  COVID positive EXAM: CT HEAD WITHOUT CONTRAST CT CERVICAL SPINE WITHOUT CONTRAST TECHNIQUE: Multidetector CT imaging of the head and cervical spine was performed following the standard protocol without intravenous contrast. Multiplanar CT image reconstructions of the cervical spine were also generated. RADIATION DOSE REDUCTION: This exam was performed according to the departmental dose-optimization program which includes automated exposure control, adjustment of the mA and/or kV according to patient size and/or use of iterative reconstruction technique. COMPARISON:  07/21/2016 FINDINGS: CT HEAD FINDINGS Brain: No evidence of acute infarction, hemorrhage, hydrocephalus, extra-axial collection or mass lesion/mass effect. Moderate brain atrophy and chronic small vessel ischemia. A chronic lacunar infarct has occurred in the right thalamus. Vascular: No hyperdense vessel or unexpected calcification. Skull: Normal. Negative for fracture or focal lesion. Sinuses/Orbits: Partial right mastoid opacification with negative nasopharynx. CT CERVICAL SPINE FINDINGS Alignment: No traumatic malalignment Skull base and vertebrae: No acute fracture.  Subjective osteopenia Soft tissues and spinal canal: No prevertebral fluid or swelling. No visible canal hematoma. Disc levels: Advanced and generalized degeneration without visible cord impingement. C3-4 facet ankylosis on the right where there is bulky spurring encroaching on the  foramen. Upper chest: Pleural based scarring with calcification at the right apex. IMPRESSION: 1. No evidence of acute intracranial or cervical spine injury. 2. Right mastoid opacification. 3. Degenerative and senescent changes described above. Electronically Signed   By: Jorje Guild M.D.   On: 05/05/2022 04:51            LOS: 4 days       Emeterio Reeve, DO [facility] 05/09/2022, 2:49 PM   Staff may message me via secure chat in [place]  but this may not receive immediate response,  please page for urgent matters!  If 7PM-7AM, please contact night-coverage [email or web address]  Dictation software was used to generate the above note. Typos may occur and escape review, as with typed/written notes. Please contact Dr Sheppard Coil directly for clarity if needed.

## 2022-05-10 DIAGNOSIS — R296 Repeated falls: Secondary | ICD-10-CM | POA: Diagnosis not present

## 2022-05-10 MED ORDER — ENSURE ENLIVE PO LIQD: 237.0000 mL | Freq: Two times a day (BID) | ORAL | 12 refills | Status: AC

## 2022-05-10 NOTE — TOC Progression Note (Signed)
Transition of Care [facility]) - Progression Note    Patient Details  Name: Gabriella Rogers MRN: [id number] Date of Birth: 22-Dec-1933  Transition of Care [facility]) CM/SW Contact  [name], RN Phone Number: 05/10/2022, 10:59 AM  Clinical Narrative:      approved for sub level 1 for 11/20 - 11/26, review due 11/27. Cert: [id number].       Expected Discharge Plan and Services                                                 Social Determinants of Health (SDOH) Interventions    Readmission Risk Interventions    05/01/2022    4:23 PM  Readmission Risk Prevention Plan  Transportation Screening Complete  PCP or Specialist Appt within 3-5 Days Complete  HRI or Home Care Consult Complete  Social Work Consult for Recovery Care Planning/Counseling Complete  Palliative Care Screening Not Applicable  Medication Review [facility]) Complete

## 2022-05-10 NOTE — TOC Progression Note (Addendum)
Transition of Care [facility]) - Progression Note    Patient Details  Name: Gabriella Rogers MRN: [id number] Date of Birth: [birth year]  Transition of Care [facility]) CM/SW Contact  [name], RN Phone Number: 05/10/2022, 11:18 AM  Clinical Narrative:    Spoke with [name] I updated her and let her know that we have Ins approval to go to [facility], she stated thanks and will try to see the patient prior to DC if not she will go to [facility] to see her The patient will go to room 207A at [facility]  EMS called to transport       Expected Discharge Plan and Services           Expected Discharge Date: 05/10/22                                     Social Determinants of Health (SDOH) Interventions    Readmission Risk Interventions    05/01/2022    4:23 PM  Readmission Risk Prevention Plan  Transportation Screening Complete  PCP or Specialist Appt within 3-5 Days Complete  HRI or Home Care Consult Complete  Social Work Consult for Recovery Care Planning/Counseling Complete  Palliative Care Screening Not Applicable  Medication Review [facility]) Complete

## 2022-05-10 NOTE — Plan of Care (Signed)

## 2022-05-10 NOTE — Discharge Summary (Signed)
Physician Discharge Summary   Patient: Gabriella Rogers MRN: [id number]  DOB: 01-14-1934   Admit:     Date of Admission: 05/05/2022 Admitted from: home   Discharge: Date of discharge: 05/10/22 Disposition: Skilled nursing facility Condition at discharge: fair  CODE STATUS: DNR     Discharge Physician: Emeterio Reeve, DO [facility]     PCP: Cletis Athens, MD  Recommendations for Outpatient Follow-up:  Follow up with PCP Cletis Athens, MD in 2-4 weeks Please obtain labs/tests: CBC, BMP in 4-2 weeks Monitor BP at SNF and adjust medications as appropriate  Follow w/ orthopedics (Dr. Karel Jarvis) in 1-2 weeks for repeat XR  Please follow up on the following pending results: none PCP AND OTHER OUTPATIENT PROVIDERS: SEE BELOW FOR SPECIFIC DISCHARGE INSTRUCTIONS PRINTED FOR PATIENT IN ADDITION TO GENERIC AVS PATIENT INFO    Discharge Instructions     Diet - low sodium heart healthy   Complete by: As directed    Increase activity slowly   Complete by: As directed          Discharge Diagnoses: Principal Problem:   Unwitnessed fall Active Problems:   COVID-19   Closed right acetabular fracture ([place])   Closed fracture of right inferior pubic ramus (HCC)   AKI (acute kidney injury) ([place])   Hypokalemia   CVA (cerebral vascular accident) ([place])   Dementia without behavioral disturbance ([place])   Essential hypertension   Sinus bradycardia   Malnutrition of moderate degree       Hospital Course: Gabriella Rogers is a 88 y.o. female with medical history significant  for dementia, depression, history of prior stroke, dyslipidemia, recent hospitalization for COVID-19 viral illness with superimposed bacterial pneumonia who returns to the emergency room for evaluation of an unwitnessed fall.  Per ER documentation patient was found on the bathroom floor by staff at [facility] with complaints of right hip pain. Patient states that she thinks the floor  was wet and she tripped and fell.  11/15: Pelvic x-ray shows new irregularity of the right acetabulum and obturator ring. CT of the right hip without contrast shows acute, branching fracture through the right superior acetabulum with mild protrusion deformity. Nondisplaced right inferior pubic ramus fracture. Pronounced osteopenia. Expected soft tissue hemorrhage in the deep pelvis. Admitting hospitalist notes that daughter requests nonsurgical intervention, she also confirms pt is nonambulatory but can typically transfer bed/wheelchair. Dementia limits ability to perform complete ROS / obtain much history from the patient. Pt admitted for pain control and orthopedic consult, treat AKI, eval possible causes syncope  11/16: AKI resolved, VSS. Ortho clear for discharge from their standpoint. PT/OT eval pending. TOC following, suspect will need SNF for long term care. TOC following 11/17-11/19: medically stable. awaiting SNF at [facility] - auth pending thru the weekend. Labs WNL 11/17 11/20: SNF auth approved, stable fir discharge, COVID restrictions expired and no need for further isolation precautions  Consultants:  Orthopedic Surgery   Procedures: none      ASSESSMENT & PLAN:   Principal Problem:   Unwitnessed fall Active Problems:   COVID-19   Closed right acetabular fracture ([place])   Closed fracture of right inferior pubic ramus ([place])   AKI (acute kidney injury) ([place])   CVA (cerebral vascular accident) ([place])   Dementia without behavioral disturbance ([place])   Essential hypertension   Sinus bradycardia   Malnutrition of moderate degree  Closed right acetabular fracture (HCC) Status post fall with a closed right acetabular fracture Nonsurgical  management if possible Pain control with scheduled acetaminophen and as needed tramadol per Ortho recommendation Fall precautions Orthopedic surgery consult PT/OT Plan SNF for long term care   Closed fracture of right inferior pubic ramus  (HCC) Status post fall with closed fracture of right inferior pubic ramus Nonsurgical management Pain control  Fall precautions Orthopedic surgery consult --> follow outpatient  PT/OT --> SNF rehab May need SNF for long term care   Sinus bradycardia - resolved Patient noted to be bradycardic on the monitor She is started post an unwitnessed fall and it is unclear if she had a syncopal episode. Donepezil was discontinued during her last hospitalization due to sinus bradycardia Obtain TSH --> no concerns   AKI (acute kidney injury) (HCC) - resolved Most likely related to diuretic therapy Patient has a baseline serum creatinine of 0.7 and on admission it is 1.13 Judicious IV fluid resuscitation Hold HCTZ Repeat renal parameter is in a.m. --> improved   COVID-19 Lobar pneumonia - improved on CXR Recent hospitalization for COVID-19 viral illness w/ lobar pneumonia completed abx for pneumonia   Hypokalmia - resolved Repleted Monitor periodic BMP  Unwitnessed fall Patient was brought into the ER for evaluation of unwitnessed fall with right-sided hip pain and imaging shows a right superior acetabulum fracture with mild protrusio deformity and non displaced right inferior pubic ramus fracture. We will place patient on fall precautions Orthopedic surgery consult PT/OT  Essential hypertension Hold hydrochlorothiazide due to relative hypotension and AKI  Dementia without behavioral disturbance (HCC) Continue Paxil  Hx CVA (cerebral vascular accident) (HCC) History of prior stroke on dual antiplatelet therapy and atorvastatin Hold aspirin and Plavix for now due to CT scan findings suggestive of expected soft tissue hemorrhage in the deep pelvis --> this was restarted by admitting hospitalist after consultation w/ Dr. Audelia Acton  c continue atorvastatin            Discharge Instructions  Allergies as of 05/10/2022       Reactions   Actonel  [risedronate Sodium]     Piper    Pneumococcal Vaccine Other (See Comments)   WEAKNESS, DISORIENTED, "ARM SORE, RED, HURTING"   Risedronate Other (See Comments)   REDNESS IN FACE   Alendronate Rash   Aleve [naproxen Sodium] Rash   Pravastatin Rash        Medication List     STOP taking these medications    amoxicillin-clavulanate 875-125 MG tablet Commonly known as: AUGMENTIN   donepezil 10 MG tablet Commonly known as: ARICEPT   doxycycline 100 MG capsule Commonly known as: VIBRAMYCIN   hydrochlorothiazide 12.5 MG tablet Commonly known as: HYDRODIURIL   LORazepam 0.5 MG tablet Commonly known as: ATIVAN       TAKE these medications    acetaminophen 325 MG tablet Commonly known as: TYLENOL Take 650 mg by mouth every 6 (six) hours as needed.   artificial tears ointment Place 1 drop into both eyes every 12 (twelve) hours.   aspirin EC 81 MG tablet Take 1 tablet (81 mg total) by mouth daily. Swallow whole.   atorvastatin 80 MG tablet Commonly known as: LIPITOR Take 1 tablet (80 mg total) by mouth daily at 6 PM.   clopidogrel 75 MG tablet Commonly known as: PLAVIX Take 1 tablet (75 mg total) by mouth daily.   famotidine 20 MG tablet Commonly known as: PEPCID Take 20 mg by mouth 2 (two) times daily.   feeding supplement Liqd Take 237 mLs by mouth 2 (two) times daily  between meals.   ONE-A-DAY WOMENS PO Take 1 tablet by mouth daily.   PARoxetine 10 MG tablet Commonly known as: PAXIL Take 10 mg by mouth daily.   polyethylene glycol 17 g packet Commonly known as: MIRALAX / GLYCOLAX Take 17 g by mouth daily as needed for moderate constipation or severe constipation.         Contact information for after-discharge care     Destination     [facility] Preferred SNF .   Service: Skilled Nursing Contact information: [address] [place] [id number] [phone]                     Allergies  Allergen Reactions   Actonel   [Risedronate Sodium]    Piper    Pneumococcal Vaccine Other (See Comments)    WEAKNESS, DISORIENTED, "ARM SORE, RED, HURTING"   Risedronate Other (See Comments)    REDNESS IN FACE   Alendronate Rash   Aleve [Naproxen Sodium] Rash   Pravastatin Rash     Subjective: pt feeling well today other than some sore throat which she states has been bothering her intermittently since COVID illness   Discharge Exam: BP 132/72 (BP Location: Left Arm)   Pulse 68   Temp 97.9 F (36.6 C)   Resp 20   Ht 5\' 3"  (1.6 m)   Wt 51.9 kg   SpO2 99%   BMI 20.28 kg/m  General: Pt is alert, awake, not in acute distress Cardiovascular: RRR, S1/S2 +, no rubs, no gallops Respiratory: CTA bilaterally, no wheezing, no rhonchi Abdominal: Soft, NT, ND, bowel sounds + Extremities: no edema, no cyanosis     The results of significant diagnostics from this hospitalization (including imaging, microbiology, ancillary and laboratory) are listed below for reference.     Microbiology: Recent Results (from the past 240 hour(s))  Resp Panel by RT-PCR (Flu A&B, Covid) Anterior Nasal Swab     Status: Abnormal   Collection Time: 04/30/22 12:21 PM   Specimen: Anterior Nasal Swab  Result Value Ref Range Status   SARS Coronavirus 2 by RT PCR POSITIVE (A) NEGATIVE Final    Comment: (NOTE) SARS-CoV-2 target nucleic acids are DETECTED.  The SARS-CoV-2 RNA is generally detectable in upper respiratory specimens during the acute phase of infection. Positive results are indicative of the presence of the identified virus, but do not rule out bacterial infection or co-infection with other pathogens not detected by the test. Clinical correlation with patient history and other diagnostic information is necessary to determine patient infection status. The expected result is Negative.  Fact Sheet for Patients: BloggerCourse.comhttps://www.fda.gov/media/152166/download  Fact Sheet for Healthcare  Providers: SeriousBroker.ithttps://www.fda.gov/media/152162/download  This test is not yet approved or cleared by the [place] FDA and  has been authorized for detection and/or diagnosis of SARS-CoV-2 by FDA under an Emergency Use Authorization (EUA).  This EUA will remain in effect (meaning this test can be used) for the duration of  the COVID-19 declaration under Section 564(b)(1) of the A ct, 21 U.S.C. section 360bbb-3(b)(1), unless the authorization is terminated or revoked sooner.     Influenza A by PCR NEGATIVE NEGATIVE Final   Influenza B by PCR NEGATIVE NEGATIVE Final    Comment: (NOTE) The Xpert Xpress SARS-CoV-2/FLU/RSV plus assay is intended as an aid in the diagnosis of influenza from Nasopharyngeal swab specimens and should not be used as a sole basis for treatment. Nasal washings and aspirates are unacceptable for Xpert Xpress SARS-CoV-2/FLU/RSV testing.  Fact Sheet for Patients: EntrepreneurPulse.com.au  Fact Sheet for Healthcare Providers: IncredibleEmployment.be  This test is not yet approved or cleared by the [place] FDA and has been authorized for detection and/or diagnosis of SARS-CoV-2 by FDA under an Emergency Use Authorization (EUA). This EUA will remain in effect (meaning this test can be used) for the duration of the COVID-19 declaration under Section 564(b)(1) of the Act, 21 U.S.C. section 360bbb-3(b)(1), unless the authorization is terminated or revoked.  Performed at [facility], [address] [place], [place] [address]      Labs: BNP (last 3 results) No results for input(s): "BNP" in the last 8760 hours. Basic Metabolic Panel: Recent Labs  Lab 05/05/22 0603 05/06/22 0559 05/07/22 0940  NA 138 139 138  K 3.5 3.2* 4.5  CL 107 112* 110  CO2 21* 24 23  GLUCOSE 109* 85 92  BUN 36* 29* 16  CREATININE 1.13* 0.72 0.62  CALCIUM 8.6* 7.9* 8.2*   Liver Function Tests: Recent Labs  Lab  05/05/22 0603  AST 89*  ALT 57*  ALKPHOS 73  BILITOT 1.2  PROT 7.0  ALBUMIN 3.5   No results for input(s): "LIPASE", "AMYLASE" in the last 168 hours. No results for input(s): "AMMONIA" in the last 168 hours. CBC: Recent Labs  Lab 05/05/22 0603 05/06/22 0559 05/07/22 0940  WBC 13.6* 7.0 10.2  NEUTROABS 11.2*  --   --   HGB 12.3 9.3* 11.0*  HCT 35.6* 27.3* 32.2*  MCV 99.7 98.9 100.0  PLT 270 216 264   Cardiac Enzymes: Recent Labs  Lab 05/05/22 0603  CKTOTAL 996*   BNP: Invalid input(s): "POCBNP" CBG: No results for input(s): "GLUCAP" in the last 168 hours. D-Dimer No results for input(s): "DDIMER" in the last 72 hours. Hgb A1c No results for input(s): "HGBA1C" in the last 72 hours. Lipid Profile No results for input(s): "CHOL", "HDL", "LDLCALC", "TRIG", "CHOLHDL", "LDLDIRECT" in the last 72 hours. Thyroid function studies No results for input(s): "TSH", "T4TOTAL", "T3FREE", "THYROIDAB" in the last 72 hours.  Invalid input(s): "FREET3" Anemia work up No results for input(s): "VITAMINB12", "FOLATE", "FERRITIN", "TIBC", "IRON", "RETICCTPCT" in the last 72 hours. Urinalysis    Component Value Date/Time   COLORURINE YELLOW (A) 05/06/2022 0615   APPEARANCEUR CLOUDY (A) 05/06/2022 0615   APPEARANCEUR Clear 10/31/2017 1420   LABSPEC 1.033 (H) 05/06/2022 0615   LABSPEC 1.012 02/25/2014 1511   PHURINE 5.0 05/06/2022 0615   GLUCOSEU NEGATIVE 05/06/2022 0615   GLUCOSEU Negative 02/25/2014 1511   HGBUR MODERATE (A) 05/06/2022 0615   BILIRUBINUR NEGATIVE 05/06/2022 0615   BILIRUBINUR Negative 04/13/2021 1529   BILIRUBINUR Negative 10/31/2017 1420   BILIRUBINUR Negative 02/25/2014 1511   KETONESUR NEGATIVE 05/06/2022 0615   PROTEINUR 30 (A) 05/06/2022 0615   UROBILINOGEN 1.0 04/13/2021 1529   NITRITE NEGATIVE 05/06/2022 0615   LEUKOCYTESUR SMALL (A) 05/06/2022 0615   LEUKOCYTESUR Negative 02/25/2014 1511   Sepsis Labs Recent Labs  Lab 05/05/22 0603  05/06/22 0559 05/07/22 0940  WBC 13.6* 7.0 10.2   Microbiology Recent Results (from the past 240 hour(s))  Resp Panel by RT-PCR (Flu A&B, Covid) Anterior Nasal Swab     Status: Abnormal   Collection Time: 04/30/22 12:21 PM   Specimen: Anterior Nasal Swab  Result Value Ref Range Status   SARS Coronavirus 2 by RT PCR POSITIVE (A) NEGATIVE Final    Comment: (NOTE) SARS-CoV-2 target nucleic acids are DETECTED.  The SARS-CoV-2 RNA is generally detectable in upper respiratory specimens during  the acute phase of infection. Positive results are indicative of the presence of the identified virus, but do not rule out bacterial infection or co-infection with other pathogens not detected by the test. Clinical correlation with patient history and other diagnostic information is necessary to determine patient infection status. The expected result is Negative.  Fact Sheet for Patients: EntrepreneurPulse.com.au  Fact Sheet for Healthcare Providers: IncredibleEmployment.be  This test is not yet approved or cleared by the [place] FDA and  has been authorized for detection and/or diagnosis of SARS-CoV-2 by FDA under an Emergency Use Authorization (EUA).  This EUA will remain in effect (meaning this test can be used) for the duration of  the COVID-19 declaration under Section 564(b)(1) of the A ct, 21 U.S.C. section 360bbb-3(b)(1), unless the authorization is terminated or revoked sooner.     Influenza A by PCR NEGATIVE NEGATIVE Final   Influenza B by PCR NEGATIVE NEGATIVE Final    Comment: (NOTE) The Xpert Xpress SARS-CoV-2/FLU/RSV plus assay is intended as an aid in the diagnosis of influenza from Nasopharyngeal swab specimens and should not be used as a sole basis for treatment. Nasal washings and aspirates are unacceptable for Xpert Xpress SARS-CoV-2/FLU/RSV testing.  Fact Sheet for Patients: EntrepreneurPulse.com.au  Fact  Sheet for Healthcare Providers: IncredibleEmployment.be  This test is not yet approved or cleared by the [place] FDA and has been authorized for detection and/or diagnosis of SARS-CoV-2 by FDA under an Emergency Use Authorization (EUA). This EUA will remain in effect (meaning this test can be used) for the duration of the COVID-19 declaration under Section 564(b)(1) of the Act, 21 U.S.C. section 360bbb-3(b)(1), unless the authorization is terminated or revoked.  Performed at [facility], [place], [place] [address]    Imaging CT Hip Right Wo Contrast  Result Date: 05/05/2022 CLINICAL DATA:  Hip fracture suspected EXAM: CT OF THE RIGHT HIP WITHOUT CONTRAST TECHNIQUE: Multidetector CT imaging of the right hip was performed according to the standard protocol. Multiplanar CT image reconstructions were also generated. RADIATION DOSE REDUCTION: This exam was performed according to the departmental dose-optimization program which includes automated exposure control, adjustment of the mA and/or kV according to patient size and/or use of iterative reconstruction technique. COMPARISON:  Pelvis radiography from earlier today FINDINGS: Confirmed branching right acetabular fracture with mild protrusio deformity on coronal reformats. There is a mildly displaced fracture through the inferior pubic ramus on the right. Pronounced osteopenia. Expected hemorrhage in the pelvis adjacent to the fracture. Simple right adnexal cyst measuring 2.7 cm. No follow-up imaging recommended. Note: This recommendation does not apply to premenarchal patients and to those with increased risk (genetic, family history, elevated tumor markers or other high-risk factors) of ovarian cancer. Reference: JACR 2020 Feb; 17(2):248-254 IMPRESSION: 1. Acute, branching fracture through the right superior acetabulum with mild protrusio deformity. 2. Nondisplaced right inferior pubic ramus  fracture. 3. Pronounced osteopenia. 4. Expected soft tissue hemorrhage in the deep pelvis. Electronically Signed   By: Jorje Guild M.D.   On: 05/05/2022 06:31   DG Chest Port 1 View  Result Date: 05/05/2022 CLINICAL DATA:  Fall with cough.  COVID. EXAM: PORTABLE CHEST 1 VIEW COMPARISON:  04/30/2022 FINDINGS: Resolved left lower lobe pneumonia. Right apical opacity which is pleural based scarring and calcification by cervical spine CT. Normal heart size and mediastinal contours. No acute finding. IMPRESSION: Significantly improved aeration at the left base. No new or acute finding. Electronically Signed   By: Gilford Silvius.D.  On: 05/05/2022 05:05   DG Pelvis Portable  Result Date: 05/05/2022 CLINICAL DATA:  Fall.  Unsure which hip is painful EXAM: PORTABLE PELVIS 1-2 VIEWS COMPARISON:  01/09/2022 FINDINGS: Subjective osteopenia. Distorted iliopectineal line on the right, not seen previously, without discrete fracture line. Irregularity at the inferior right pubic ramus which also appears new and consistent with fracture. Remote fracture of the right lateral iliac wing. Both hips appear located in the proximal femurs appear intact. IMPRESSION: New irregularity of the right acetabulum and obturator ring, recommend CT. Electronically Signed   By: Jorje Guild M.D.   On: 05/05/2022 05:03   CT Head Wo Contrast  Result Date: 05/05/2022 CLINICAL DATA:  Neck trauma.  Weakness and fall.  COVID positive EXAM: CT HEAD WITHOUT CONTRAST CT CERVICAL SPINE WITHOUT CONTRAST TECHNIQUE: Multidetector CT imaging of the head and cervical spine was performed following the standard protocol without intravenous contrast. Multiplanar CT image reconstructions of the cervical spine were also generated. RADIATION DOSE REDUCTION: This exam was performed according to the departmental dose-optimization program which includes automated exposure control, adjustment of the mA and/or kV according to patient size and/or use  of iterative reconstruction technique. COMPARISON:  07/21/2016 FINDINGS: CT HEAD FINDINGS Brain: No evidence of acute infarction, hemorrhage, hydrocephalus, extra-axial collection or mass lesion/mass effect. Moderate brain atrophy and chronic small vessel ischemia. A chronic lacunar infarct has occurred in the right thalamus. Vascular: No hyperdense vessel or unexpected calcification. Skull: Normal. Negative for fracture or focal lesion. Sinuses/Orbits: Partial right mastoid opacification with negative nasopharynx. CT CERVICAL SPINE FINDINGS Alignment: No traumatic malalignment Skull base and vertebrae: No acute fracture.  Subjective osteopenia Soft tissues and spinal canal: No prevertebral fluid or swelling. No visible canal hematoma. Disc levels: Advanced and generalized degeneration without visible cord impingement. C3-4 facet ankylosis on the right where there is bulky spurring encroaching on the foramen. Upper chest: Pleural based scarring with calcification at the right apex. IMPRESSION: 1. No evidence of acute intracranial or cervical spine injury. 2. Right mastoid opacification. 3. Degenerative and senescent changes described above. Electronically Signed   By: Jorje Guild M.D.   On: 05/05/2022 04:51   CT Cervical Spine Wo Contrast  Result Date: 05/05/2022 CLINICAL DATA:  Neck trauma.  Weakness and fall.  COVID positive EXAM: CT HEAD WITHOUT CONTRAST CT CERVICAL SPINE WITHOUT CONTRAST TECHNIQUE: Multidetector CT imaging of the head and cervical spine was performed following the standard protocol without intravenous contrast. Multiplanar CT image reconstructions of the cervical spine were also generated. RADIATION DOSE REDUCTION: This exam was performed according to the departmental dose-optimization program which includes automated exposure control, adjustment of the mA and/or kV according to patient size and/or use of iterative reconstruction technique. COMPARISON:  07/21/2016 FINDINGS: CT HEAD  FINDINGS Brain: No evidence of acute infarction, hemorrhage, hydrocephalus, extra-axial collection or mass lesion/mass effect. Moderate brain atrophy and chronic small vessel ischemia. A chronic lacunar infarct has occurred in the right thalamus. Vascular: No hyperdense vessel or unexpected calcification. Skull: Normal. Negative for fracture or focal lesion. Sinuses/Orbits: Partial right mastoid opacification with negative nasopharynx. CT CERVICAL SPINE FINDINGS Alignment: No traumatic malalignment Skull base and vertebrae: No acute fracture.  Subjective osteopenia Soft tissues and spinal canal: No prevertebral fluid or swelling. No visible canal hematoma. Disc levels: Advanced and generalized degeneration without visible cord impingement. C3-4 facet ankylosis on the right where there is bulky spurring encroaching on the foramen. Upper chest: Pleural based scarring with calcification at the right apex. IMPRESSION: 1. No evidence  of acute intracranial or cervical spine injury. 2. Right mastoid opacification. 3. Degenerative and senescent changes described above. Electronically Signed   By: Jorje Guild M.D.   On: 05/05/2022 04:51      Time coordinating discharge: over 30 minutes  SIGNED:  Emeterio Reeve DO [facility]

## 2022-05-10 NOTE — Progress Notes (Signed)
Physical Therapy Treatment Patient Details Name: Gabriella Rogers MRN: [id number] DOB: Feb 29, 1934 Today's Date: 05/10/2022   History of Present Illness Gabriella Rogers is a 88 y.o. female with medical history significant  for dementia, depression, history of prior stroke, dyslipidemia, recent hospitalization for COVID-19 viral illness with superimposed bacterial pneumonia who returns to the emergency room for evaluation of an unwitnessed fall.  Per ER documentation patient was found on the bathroom floor with complaints of right hip pain.    PT Comments    Pt received upright in bed, not initially agreeable to PT but after re-education on benefits and pt's personal goals wishing to walk again, pt is amenable.  Pt grossly performs bed mobility with supervision with bed maximally elevated and standing with modA and max VC's for RLE WB although PT demo'd prior to performing. Pt with limited ability to maintain WB precautions on RLE this date requiring modA at torso with step pivot into recliner. Pt positioned with all needs in reach. D/c recs remain appropriate due to pt's inability to maintain WB precautions.    Recommendations for follow up therapy are one component of a multi-disciplinary discharge planning process, led by the attending physician.  Recommendations may be updated based on patient status, additional functional criteria and insurance authorization.  Follow Up Recommendations  Skilled nursing-short term rehab (<3 hours/day) Can patient physically be transported by private vehicle: No   Assistance Recommended at Discharge Frequent or constant Supervision/Assistance  Patient can return home with the following Help with stairs or ramp for entrance;Assist for transportation;Assistance with cooking/housework;Direct supervision/assist for financial management;Direct supervision/assist for medications management;A lot of help with bathing/dressing/bathroom;A lot of help with walking and/or  transfers   Equipment Recommendations  Rolling walker (2 wheels)    Recommendations for Other Services       Precautions / Restrictions Precautions Precautions: Fall Precaution Comments: Per orthopedic, due to baseline dementia, okay to perform limited WB on RLE if can not follow TTWB. Restrictions Weight Bearing Restrictions: Yes RLE Weight Bearing: Touchdown weight bearing LLE Weight Bearing: Weight bearing as tolerated     Mobility  Bed Mobility Overal bed mobility: Needs Assistance Bed Mobility: Supine to Sit     Supine to sit: Supervision, HOB elevated       Patient Response: Cooperative, Flat affect  Transfers Overall transfer level: Needs assistance Equipment used: Rolling walker (2 wheels) Transfers: Sit to/from Stand     Step pivot transfers: Mod assist, From elevated surface       General transfer comment: unable to maintain RLE WB precautions this date with steps to recliner. Required modA at torso to pivot to recliner    Ambulation/Gait               General Gait Details: unable to take steps and maintian WB status   Stairs             Wheelchair Mobility    Modified Rankin (Stroke Patients Only)       Balance Overall balance assessment: Needs assistance Sitting-balance support: No upper extremity supported Sitting balance-Leahy Scale: Fair     Standing balance support: Bilateral upper extremity supported, During functional activity, Reliant on assistive device for balance Standing balance-Leahy Scale: Poor                              Cognition Arousal/Alertness: Awake/alert Behavior During Therapy: WFL for tasks assessed/performed Overall Cognitive Status: History of cognitive impairments -  at baseline                                          Exercises      General Comments        Pertinent Vitals/Pain Pain Assessment Pain Assessment: Faces Faces Pain Scale: Hurts a little  bit Pain Location: right hip Pain Descriptors / Indicators: Aching, Grimacing, Guarding Pain Intervention(s): Limited activity within patient's tolerance, Monitored during session, Repositioned    Home Living                          Prior Function            PT Goals (current goals can now be found in the care plan section) Acute Rehab PT Goals Patient Stated Goal: get walking better PT Goal Formulation: Patient unable to participate in goal setting Time For Goal Achievement: 05/15/22 Potential to Achieve Goals: Fair Progress towards PT goals: Not progressing toward goals - comment (only tolerating transfers due to limited ability to tolerate RLE precautions)    Frequency    7X/week      PT Plan Current plan remains appropriate    Co-evaluation              AM-PAC PT "6 Clicks" Mobility   Outcome Measure  Help needed turning from your back to your side while in a flat bed without using bedrails?: A Little Help needed moving from lying on your back to sitting on the side of a flat bed without using bedrails?: A Little Help needed moving to and from a bed to a chair (including a wheelchair)?: A Lot Help needed standing up from a chair using your arms (e.g., wheelchair or bedside chair)?: A Lot Help needed to walk in hospital room?: Total Help needed climbing 3-5 steps with a railing? : Total 6 Click Score: 12    End of Session Equipment Utilized During Treatment: Gait belt Activity Tolerance: Patient limited by pain Patient left: in chair;with call bell/phone within reach;with chair alarm set Nurse Communication: Mobility status PT Visit Diagnosis: Muscle weakness (generalized) (M62.81);Difficulty in walking, not elsewhere classified (R26.2);Unsteadiness on feet (R26.81);Pain;History of falling (Z91.81) Pain - Right/Left: Right Pain - part of body: Hip;Leg     Time: 1100-1116 PT Time Calculation (min) (ACUTE ONLY): 16 min  Charges:  $Therapeutic  Activity: 8-22 mins                    Alissah Redmon M. Fairly IV, PT, DPT Physical Therapist- [facility]  05/10/2022, 12:09 PM

## 2022-05-10 NOTE — Care Management Important Message (Signed)
Important Message  Patient Details  Name: REXINE GOWENS MRN: [id number] Date of Birth: [birth year]   Medicare Important Message Given:  Other (see comment)  Left a voice message for the patient's Daun Peacock, daughter [phone]) to review the Important Message from Medicare. Will await a return call.    Olegario Messier A Trista Ciocca 05/10/2022, 11:27 AM
